# Patient Record
Sex: Male | Born: 1950 | Race: White | Hispanic: No | Marital: Married | Smoking: Never smoker
Health system: Southern US, Community
[De-identification: ages and names within clinical notes are randomized; demographics above are authoritative.]

## PROBLEM LIST (undated history)

## (undated) DIAGNOSIS — E669 Obesity, unspecified: Secondary | ICD-10-CM

## (undated) DIAGNOSIS — M86172 Other acute osteomyelitis, left ankle and foot: Secondary | ICD-10-CM

## (undated) DIAGNOSIS — N183 Chronic kidney disease, stage 3 unspecified: Secondary | ICD-10-CM

## (undated) DIAGNOSIS — I482 Chronic atrial fibrillation, unspecified: Secondary | ICD-10-CM

## (undated) DIAGNOSIS — E1169 Type 2 diabetes mellitus with other specified complication: Secondary | ICD-10-CM

## (undated) DIAGNOSIS — I10 Essential (primary) hypertension: Secondary | ICD-10-CM

## (undated) DIAGNOSIS — L97309 Non-pressure chronic ulcer of unspecified ankle with unspecified severity: Secondary | ICD-10-CM

## (undated) DIAGNOSIS — Z6841 Body Mass Index (BMI) 40.0 and over, adult: Secondary | ICD-10-CM

## (undated) DIAGNOSIS — Z9289 Personal history of other medical treatment: Secondary | ICD-10-CM

## (undated) DIAGNOSIS — E11622 Type 2 diabetes mellitus with other skin ulcer: Secondary | ICD-10-CM

## (undated) HISTORY — PX: DEBRIDEMENT  FOOT: SUR387

---

## 2018-03-05 ENCOUNTER — Inpatient Hospital Stay: Payer: Medicare Other

## 2018-03-05 ENCOUNTER — Emergency Department: Payer: Medicare Other

## 2018-03-05 ENCOUNTER — Inpatient Hospital Stay
Admission: EM | Admit: 2018-03-05 | Discharge: 2018-03-14 | DRG: 853 | Disposition: A | Discharge status: Skilled Nursing Facility | Payer: Medicare Other | Attending: Internal Medicine | Admitting: Internal Medicine

## 2018-03-05 ENCOUNTER — Other Ambulatory Visit: Payer: Self-pay

## 2018-03-05 DIAGNOSIS — M86672 Other chronic osteomyelitis, left ankle and foot: Secondary | ICD-10-CM | POA: Diagnosis present

## 2018-03-05 DIAGNOSIS — Z4659 Encounter for fitting and adjustment of other gastrointestinal appliance and device: Secondary | ICD-10-CM

## 2018-03-05 DIAGNOSIS — Z9981 Dependence on supplemental oxygen: Secondary | ICD-10-CM

## 2018-03-05 DIAGNOSIS — Z833 Family history of diabetes mellitus: Secondary | ICD-10-CM

## 2018-03-05 DIAGNOSIS — E11621 Type 2 diabetes mellitus with foot ulcer: Secondary | ICD-10-CM | POA: Diagnosis not present

## 2018-03-05 DIAGNOSIS — Z882 Allergy status to sulfonamides status: Secondary | ICD-10-CM

## 2018-03-05 DIAGNOSIS — I878 Other specified disorders of veins: Secondary | ICD-10-CM | POA: Diagnosis present

## 2018-03-05 DIAGNOSIS — K219 Gastro-esophageal reflux disease without esophagitis: Secondary | ICD-10-CM | POA: Diagnosis present

## 2018-03-05 DIAGNOSIS — M160 Bilateral primary osteoarthritis of hip: Secondary | ICD-10-CM | POA: Diagnosis present

## 2018-03-05 DIAGNOSIS — N183 Chronic kidney disease, stage 3 unspecified: Secondary | ICD-10-CM

## 2018-03-05 DIAGNOSIS — R609 Edema, unspecified: Secondary | ICD-10-CM

## 2018-03-05 DIAGNOSIS — L97529 Non-pressure chronic ulcer of other part of left foot with unspecified severity: Secondary | ICD-10-CM | POA: Diagnosis present

## 2018-03-05 DIAGNOSIS — Y92230 Patient room in hospital as the place of occurrence of the external cause: Secondary | ICD-10-CM | POA: Diagnosis not present

## 2018-03-05 DIAGNOSIS — Y9259 Other trade areas as the place of occurrence of the external cause: Secondary | ICD-10-CM | POA: Diagnosis not present

## 2018-03-05 DIAGNOSIS — Z6841 Body Mass Index (BMI) 40.0 and over, adult: Secondary | ICD-10-CM | POA: Diagnosis not present

## 2018-03-05 DIAGNOSIS — N179 Acute kidney failure, unspecified: Secondary | ICD-10-CM

## 2018-03-05 DIAGNOSIS — K3189 Other diseases of stomach and duodenum: Secondary | ICD-10-CM

## 2018-03-05 DIAGNOSIS — E785 Hyperlipidemia, unspecified: Secondary | ICD-10-CM | POA: Diagnosis present

## 2018-03-05 DIAGNOSIS — Z7901 Long term (current) use of anticoagulants: Secondary | ICD-10-CM

## 2018-03-05 DIAGNOSIS — E11622 Type 2 diabetes mellitus with other skin ulcer: Secondary | ICD-10-CM | POA: Diagnosis present

## 2018-03-05 DIAGNOSIS — Z79899 Other long term (current) drug therapy: Secondary | ICD-10-CM

## 2018-03-05 DIAGNOSIS — E1169 Type 2 diabetes mellitus with other specified complication: Secondary | ICD-10-CM | POA: Diagnosis present

## 2018-03-05 DIAGNOSIS — M86172 Other acute osteomyelitis, left ankle and foot: Secondary | ICD-10-CM | POA: Diagnosis present

## 2018-03-05 DIAGNOSIS — J9622 Acute and chronic respiratory failure with hypercapnia: Secondary | ICD-10-CM | POA: Diagnosis present

## 2018-03-05 DIAGNOSIS — R112 Nausea with vomiting, unspecified: Secondary | ICD-10-CM

## 2018-03-05 DIAGNOSIS — Z8551 Personal history of malignant neoplasm of bladder: Secondary | ICD-10-CM

## 2018-03-05 DIAGNOSIS — I472 Ventricular tachycardia: Secondary | ICD-10-CM | POA: Diagnosis present

## 2018-03-05 DIAGNOSIS — R0602 Shortness of breath: Secondary | ICD-10-CM

## 2018-03-05 DIAGNOSIS — L97429 Non-pressure chronic ulcer of left heel and midfoot with unspecified severity: Secondary | ICD-10-CM | POA: Diagnosis present

## 2018-03-05 DIAGNOSIS — R590 Localized enlarged lymph nodes: Secondary | ICD-10-CM | POA: Diagnosis present

## 2018-03-05 DIAGNOSIS — M869 Osteomyelitis, unspecified: Secondary | ICD-10-CM | POA: Diagnosis not present

## 2018-03-05 DIAGNOSIS — E1122 Type 2 diabetes mellitus with diabetic chronic kidney disease: Secondary | ICD-10-CM | POA: Diagnosis present

## 2018-03-05 DIAGNOSIS — A4151 Sepsis due to Escherichia coli [E. coli]: Secondary | ICD-10-CM | POA: Diagnosis present

## 2018-03-05 DIAGNOSIS — I130 Hypertensive heart and chronic kidney disease with heart failure and stage 1 through stage 4 chronic kidney disease, or unspecified chronic kidney disease: Secondary | ICD-10-CM | POA: Diagnosis present

## 2018-03-05 DIAGNOSIS — T502X5A Adverse effect of carbonic-anhydrase inhibitors, benzothiadiazides and other diuretics, initial encounter: Secondary | ICD-10-CM | POA: Diagnosis not present

## 2018-03-05 DIAGNOSIS — E873 Alkalosis: Secondary | ICD-10-CM | POA: Diagnosis not present

## 2018-03-05 DIAGNOSIS — G9349 Other encephalopathy: Secondary | ICD-10-CM | POA: Diagnosis not present

## 2018-03-05 DIAGNOSIS — J9621 Acute and chronic respiratory failure with hypoxia: Secondary | ICD-10-CM | POA: Diagnosis present

## 2018-03-05 DIAGNOSIS — I5032 Chronic diastolic (congestive) heart failure: Secondary | ICD-10-CM | POA: Diagnosis present

## 2018-03-05 DIAGNOSIS — I482 Chronic atrial fibrillation, unspecified: Secondary | ICD-10-CM | POA: Diagnosis present

## 2018-03-05 DIAGNOSIS — N390 Urinary tract infection, site not specified: Secondary | ICD-10-CM | POA: Diagnosis present

## 2018-03-05 DIAGNOSIS — Z823 Family history of stroke: Secondary | ICD-10-CM

## 2018-03-05 DIAGNOSIS — I10 Essential (primary) hypertension: Secondary | ICD-10-CM | POA: Diagnosis not present

## 2018-03-05 DIAGNOSIS — K7460 Unspecified cirrhosis of liver: Secondary | ICD-10-CM | POA: Diagnosis present

## 2018-03-05 DIAGNOSIS — K567 Ileus, unspecified: Secondary | ICD-10-CM | POA: Diagnosis not present

## 2018-03-05 DIAGNOSIS — G4733 Obstructive sleep apnea (adult) (pediatric): Secondary | ICD-10-CM | POA: Diagnosis present

## 2018-03-05 DIAGNOSIS — E1143 Type 2 diabetes mellitus with diabetic autonomic (poly)neuropathy: Secondary | ICD-10-CM | POA: Diagnosis present

## 2018-03-05 DIAGNOSIS — K3184 Gastroparesis: Secondary | ICD-10-CM | POA: Diagnosis present

## 2018-03-05 DIAGNOSIS — E662 Morbid (severe) obesity with alveolar hypoventilation: Secondary | ICD-10-CM | POA: Diagnosis present

## 2018-03-05 DIAGNOSIS — L97309 Non-pressure chronic ulcer of unspecified ankle with unspecified severity: Secondary | ICD-10-CM

## 2018-03-05 DIAGNOSIS — A419 Sepsis, unspecified organism: Secondary | ICD-10-CM | POA: Diagnosis not present

## 2018-03-05 DIAGNOSIS — Z794 Long term (current) use of insulin: Secondary | ICD-10-CM

## 2018-03-05 DIAGNOSIS — I429 Cardiomyopathy, unspecified: Secondary | ICD-10-CM | POA: Diagnosis not present

## 2018-03-05 DIAGNOSIS — E11610 Type 2 diabetes mellitus with diabetic neuropathic arthropathy: Secondary | ICD-10-CM | POA: Diagnosis present

## 2018-03-05 DIAGNOSIS — L03116 Cellulitis of left lower limb: Secondary | ICD-10-CM | POA: Diagnosis present

## 2018-03-05 DIAGNOSIS — I4891 Unspecified atrial fibrillation: Secondary | ICD-10-CM | POA: Diagnosis not present

## 2018-03-05 DIAGNOSIS — W06XXXA Fall from bed, initial encounter: Secondary | ICD-10-CM | POA: Diagnosis present

## 2018-03-05 DIAGNOSIS — E1151 Type 2 diabetes mellitus with diabetic peripheral angiopathy without gangrene: Secondary | ICD-10-CM | POA: Diagnosis present

## 2018-03-05 DIAGNOSIS — K30 Functional dyspepsia: Secondary | ICD-10-CM | POA: Diagnosis present

## 2018-03-05 HISTORY — DX: Type 2 diabetes mellitus with other skin ulcer: L97.309

## 2018-03-05 HISTORY — DX: Chronic kidney disease, stage 3 unspecified: N18.30

## 2018-03-05 HISTORY — DX: Type 2 diabetes mellitus with other specified complication: E11.69

## 2018-03-05 HISTORY — DX: Body Mass Index (BMI) 40.0 and over, adult: Z684

## 2018-03-05 HISTORY — DX: Essential (primary) hypertension: I10

## 2018-03-05 HISTORY — DX: Morbid (severe) obesity due to excess calories: E66.01

## 2018-03-05 HISTORY — DX: Personal history of other medical treatment: Z92.89

## 2018-03-05 HISTORY — DX: Type 2 diabetes mellitus with other skin ulcer: E11.622

## 2018-03-05 HISTORY — DX: Chronic kidney disease, stage 3 (moderate): N18.3

## 2018-03-05 HISTORY — DX: Other acute osteomyelitis, left ankle and foot: M86.172

## 2018-03-05 HISTORY — DX: Chronic atrial fibrillation, unspecified: I48.20

## 2018-03-05 HISTORY — DX: Type 2 diabetes mellitus with other specified complication: E66.9

## 2018-03-05 LAB — COMPREHENSIVE METABOLIC PANEL
ALK PHOS: 81 U/L (ref 38–126)
ALT: 18 U/L (ref 0–44)
ANION GAP: 12 (ref 5–15)
AST: 33 U/L (ref 15–41)
Albumin: 3.4 g/dL — ABNORMAL LOW (ref 3.5–5.0)
BILIRUBIN TOTAL: 0.7 mg/dL (ref 0.3–1.2)
BUN: 29 mg/dL — ABNORMAL HIGH (ref 8–23)
CALCIUM: 8.4 mg/dL — AB (ref 8.9–10.3)
CO2: 28 mmol/L (ref 22–32)
CREATININE: 1.86 mg/dL — AB (ref 0.61–1.24)
Chloride: 101 mmol/L (ref 98–111)
GFR calc non Af Amer: 36 mL/min — ABNORMAL LOW (ref >60)
GFR, EST AFRICAN AMERICAN: 42 mL/min — AB (ref >60)
GLUCOSE: 173 mg/dL — AB (ref 70–99)
Potassium: 3.9 mmol/L (ref 3.5–5.1)
Sodium: 141 mmol/L (ref 135–145)
Total Protein: 8.3 g/dL — ABNORMAL HIGH (ref 6.5–8.1)

## 2018-03-05 LAB — BLOOD CULTURE ID PANEL (REFLEXED)
Acinetobacter baumannii: NOT DETECTED
CANDIDA KRUSEI: NOT DETECTED
CARBAPENEM RESISTANCE: NOT DETECTED
Candida albicans: NOT DETECTED
Candida glabrata: NOT DETECTED
Candida parapsilosis: NOT DETECTED
Candida tropicalis: NOT DETECTED
ENTEROCOCCUS SPECIES: NOT DETECTED
Enterobacter cloacae complex: NOT DETECTED
Enterobacteriaceae species: DETECTED — AB
Escherichia coli: DETECTED — AB
Haemophilus influenzae: NOT DETECTED
KLEBSIELLA OXYTOCA: NOT DETECTED
Klebsiella pneumoniae: NOT DETECTED
LISTERIA MONOCYTOGENES: NOT DETECTED
Neisseria meningitidis: NOT DETECTED
Proteus species: NOT DETECTED
Pseudomonas aeruginosa: NOT DETECTED
SERRATIA MARCESCENS: NOT DETECTED
STAPHYLOCOCCUS AUREUS BCID: NOT DETECTED
STAPHYLOCOCCUS SPECIES: NOT DETECTED
STREPTOCOCCUS PNEUMONIAE: NOT DETECTED
Streptococcus agalactiae: NOT DETECTED
Streptococcus pyogenes: NOT DETECTED
Streptococcus species: NOT DETECTED

## 2018-03-05 LAB — URINALYSIS, ROUTINE W REFLEX MICROSCOPIC
Bacteria, UA: NONE SEEN
Bilirubin Urine: NEGATIVE
GLUCOSE, UA: NEGATIVE mg/dL
Ketones, ur: NEGATIVE mg/dL
LEUKOCYTES UA: NEGATIVE
NITRITE: NEGATIVE
Protein, ur: 100 mg/dL — AB
SQUAMOUS EPITHELIAL / LPF: NONE SEEN (ref 0–5)
Specific Gravity, Urine: 1.014 (ref 1.005–1.030)
pH: 5 (ref 5.0–8.0)

## 2018-03-05 LAB — BLOOD GAS, ARTERIAL
ACID-BASE EXCESS: 5.8 mmol/L — AB (ref 0.0–2.0)
BICARBONATE: 31.7 mmol/L — AB (ref 20.0–28.0)
FIO2: 0.44
O2 Saturation: 97.9 %
PCO2 ART: 50 mmHg — AB (ref 32.0–48.0)
PH ART: 7.41 (ref 7.350–7.450)
PO2 ART: 102 mmHg (ref 83.0–108.0)
Patient temperature: 37

## 2018-03-05 LAB — BRAIN NATRIURETIC PEPTIDE: B NATRIURETIC PEPTIDE 5: 183 pg/mL — AB (ref 0.0–100.0)

## 2018-03-05 LAB — TROPONIN I: Troponin I: 0.03 ng/mL (ref <0.03)

## 2018-03-05 LAB — CBC WITH DIFFERENTIAL/PLATELET
BASOS PCT: 0 %
Basophils Absolute: 0.1 10*3/uL (ref 0–0.1)
EOS ABS: 0 10*3/uL (ref 0–0.7)
Eosinophils Relative: 0 %
HCT: 38.9 % — ABNORMAL LOW (ref 40.0–52.0)
Hemoglobin: 12.6 g/dL — ABNORMAL LOW (ref 13.0–18.0)
Lymphocytes Relative: 2 %
Lymphs Abs: 0.4 10*3/uL — ABNORMAL LOW (ref 1.0–3.6)
MCH: 28.2 pg (ref 26.0–34.0)
MCHC: 32.5 g/dL (ref 32.0–36.0)
MCV: 87 fL (ref 80.0–100.0)
MONO ABS: 0.9 10*3/uL (ref 0.2–1.0)
MONOS PCT: 4 %
Neutro Abs: 19.9 10*3/uL — ABNORMAL HIGH (ref 1.4–6.5)
Neutrophils Relative %: 94 %
Platelets: 272 10*3/uL (ref 150–440)
RBC: 4.47 MIL/uL (ref 4.40–5.90)
RDW: 19.2 % — AB (ref 11.5–14.5)
WBC: 21.3 10*3/uL — ABNORMAL HIGH (ref 3.8–10.6)

## 2018-03-05 LAB — HEPARIN LEVEL (UNFRACTIONATED): Heparin Unfractionated: 1.61 IU/mL — ABNORMAL HIGH (ref 0.30–0.70)

## 2018-03-05 LAB — LIPASE, BLOOD: LIPASE: 28 U/L (ref 11–51)

## 2018-03-05 LAB — GLUCOSE, CAPILLARY
GLUCOSE-CAPILLARY: 125 mg/dL — AB (ref 70–99)
GLUCOSE-CAPILLARY: 166 mg/dL — AB (ref 70–99)

## 2018-03-05 LAB — PROTIME-INR
INR: 1.42
Prothrombin Time: 17.2 seconds — ABNORMAL HIGH (ref 11.4–15.2)

## 2018-03-05 LAB — APTT
aPTT: 35 seconds (ref 24–36)
aPTT: 50 seconds — ABNORMAL HIGH (ref 24–36)

## 2018-03-05 LAB — LACTIC ACID, PLASMA
LACTIC ACID, VENOUS: 1.9 mmol/L (ref 0.5–1.9)
Lactic Acid, Venous: 3 mmol/L (ref 0.5–1.9)

## 2018-03-05 MED ORDER — PIPERACILLIN-TAZOBACTAM 3.375 G IVPB: 3.3750 g | Freq: Three times a day (TID) | INTRAVENOUS | Status: DC

## 2018-03-05 MED ORDER — ADULT MULTIVITAMIN W/MINERALS CH
1.0000 | ORAL_TABLET | Freq: Every day | ORAL | Status: DC
  Administered 2018-03-05 – 2018-03-14 (×7): 1 via ORAL
  Filled 2018-03-05 (×7): qty 1

## 2018-03-05 MED ORDER — ATORVASTATIN CALCIUM 10 MG PO TABS
10.0000 mg | ORAL_TABLET | Freq: Every evening | ORAL | Status: DC
  Administered 2018-03-05: 10 mg via ORAL
  Filled 2018-03-05: qty 1

## 2018-03-05 MED ORDER — SODIUM CHLORIDE 0.9 % IV SOLN
1.0000 g | Freq: Three times a day (TID) | INTRAVENOUS | Status: DC
  Administered 2018-03-05 – 2018-03-07 (×5): 1 g via INTRAVENOUS
  Filled 2018-03-05 (×7): qty 1

## 2018-03-05 MED ORDER — PIPERACILLIN-TAZOBACTAM 3.375 G IVPB 30 MIN
3.3750 g | Freq: Once | INTRAVENOUS | Status: AC
  Administered 2018-03-05: 3.375 g via INTRAVENOUS

## 2018-03-05 MED ORDER — BUMETANIDE 1 MG PO TABS
2.0000 mg | ORAL_TABLET | Freq: Two times a day (BID) | ORAL | Status: DC
  Administered 2018-03-05 – 2018-03-06 (×2): 2 mg via ORAL
  Filled 2018-03-05 (×3): qty 2

## 2018-03-05 MED ORDER — ACETAMINOPHEN 650 MG RE SUPP: 650.0000 mg | Freq: Four times a day (QID) | RECTAL | Status: DC | PRN

## 2018-03-05 MED ORDER — BISACODYL 5 MG PO TBEC: 5.0000 mg | DELAYED_RELEASE_TABLET | Freq: Every day | ORAL | Status: DC | PRN

## 2018-03-05 MED ORDER — VANCOMYCIN HCL 10 G IV SOLR
1750.0000 mg | INTRAVENOUS | Status: DC
  Administered 2018-03-05: 1750 mg via INTRAVENOUS
  Filled 2018-03-05 (×2): qty 1750

## 2018-03-05 MED ORDER — ACETAMINOPHEN 325 MG RE SUPP
RECTAL | Status: AC
  Administered 2018-03-05: 07:00:00
  Filled 2018-03-05: qty 1

## 2018-03-05 MED ORDER — ONDANSETRON HCL 4 MG PO TABS: 4.0000 mg | ORAL_TABLET | Freq: Four times a day (QID) | ORAL | Status: DC | PRN

## 2018-03-05 MED ORDER — ALBUTEROL SULFATE (2.5 MG/3ML) 0.083% IN NEBU: 3.0000 mL | INHALATION_SOLUTION | Freq: Two times a day (BID) | RESPIRATORY_TRACT | Status: DC

## 2018-03-05 MED ORDER — ACETAMINOPHEN 650 MG RE SUPP
RECTAL | Status: AC
  Administered 2018-03-05: 07:00:00
  Filled 2018-03-05: qty 1

## 2018-03-05 MED ORDER — BRIMONIDINE TARTRATE 0.2 % OP SOLN
1.0000 [drp] | Freq: Two times a day (BID) | OPHTHALMIC | Status: DC
  Administered 2018-03-05 – 2018-03-14 (×17): 1 [drp] via OPHTHALMIC
  Filled 2018-03-05 (×3): qty 5

## 2018-03-05 MED ORDER — VANCOMYCIN HCL IN DEXTROSE 1-5 GM/200ML-% IV SOLN
1000.0000 mg | Freq: Once | INTRAVENOUS | Status: AC
  Administered 2018-03-05: 1000 mg via INTRAVENOUS
  Filled 2018-03-05: qty 200

## 2018-03-05 MED ORDER — HEPARIN (PORCINE) IN NACL 100-0.45 UNIT/ML-% IJ SOLN
2350.0000 [IU]/h | INTRAMUSCULAR | Status: DC
  Administered 2018-03-05: 1500 [IU]/h via INTRAVENOUS
  Administered 2018-03-06: 1600 [IU]/h via INTRAVENOUS
  Administered 2018-03-06: 2100 [IU]/h via INTRAVENOUS
  Administered 2018-03-07 – 2018-03-08 (×2): 2250 [IU]/h via INTRAVENOUS
  Filled 2018-03-05 (×5): qty 250

## 2018-03-05 MED ORDER — ACETAMINOPHEN 650 MG RE SUPP
975.0000 mg | Freq: Once | RECTAL | Status: AC
  Administered 2018-03-05: 975 mg via RECTAL

## 2018-03-05 MED ORDER — INSULIN ASPART 100 UNIT/ML ~~LOC~~ SOLN
0.0000 [IU] | Freq: Three times a day (TID) | SUBCUTANEOUS | Status: DC
  Administered 2018-03-05: 1 [IU] via SUBCUTANEOUS
  Administered 2018-03-06 – 2018-03-09 (×4): 2 [IU] via SUBCUTANEOUS
  Administered 2018-03-10 (×3): 3 [IU] via SUBCUTANEOUS
  Administered 2018-03-11: 5 [IU] via SUBCUTANEOUS
  Administered 2018-03-11: 3 [IU] via SUBCUTANEOUS
  Administered 2018-03-11: 5 [IU] via SUBCUTANEOUS
  Filled 2018-03-05 (×10): qty 1

## 2018-03-05 MED ORDER — HEPARIN BOLUS VIA INFUSION
4000.0000 [IU] | Freq: Once | INTRAVENOUS | Status: AC
  Administered 2018-03-05: 4000 [IU] via INTRAVENOUS
  Filled 2018-03-05: qty 4000

## 2018-03-05 MED ORDER — CARVEDILOL 12.5 MG PO TABS
12.5000 mg | ORAL_TABLET | Freq: Two times a day (BID) | ORAL | Status: DC
  Administered 2018-03-05 – 2018-03-06 (×2): 12.5 mg via ORAL
  Filled 2018-03-05 (×2): qty 2

## 2018-03-05 MED ORDER — HYDROCODONE-ACETAMINOPHEN 5-325 MG PO TABS
1.0000 | ORAL_TABLET | ORAL | Status: DC | PRN
  Administered 2018-03-05 (×2): 2 via ORAL
  Filled 2018-03-05 (×2): qty 2

## 2018-03-05 MED ORDER — SODIUM CHLORIDE 0.9 % IV BOLUS
1000.0000 mL | Freq: Once | INTRAVENOUS | Status: AC
  Administered 2018-03-05: 1000 mL via INTRAVENOUS

## 2018-03-05 MED ORDER — TRAZODONE HCL 50 MG PO TABS: 25.0000 mg | ORAL_TABLET | Freq: Every evening | ORAL | Status: DC | PRN

## 2018-03-05 MED ORDER — DOCUSATE SODIUM 100 MG PO CAPS
100.0000 mg | ORAL_CAPSULE | Freq: Two times a day (BID) | ORAL | Status: DC
  Administered 2018-03-06 – 2018-03-08 (×3): 100 mg via ORAL
  Filled 2018-03-05 (×3): qty 1

## 2018-03-05 MED ORDER — ACETAMINOPHEN 325 MG PO TABS
650.0000 mg | ORAL_TABLET | Freq: Four times a day (QID) | ORAL | Status: DC | PRN
  Administered 2018-03-09 – 2018-03-13 (×4): 650 mg via ORAL
  Filled 2018-03-05 (×5): qty 2

## 2018-03-05 MED ORDER — ONDANSETRON HCL 4 MG/2ML IJ SOLN
4.0000 mg | Freq: Four times a day (QID) | INTRAMUSCULAR | Status: DC | PRN
  Administered 2018-03-06 (×2): 4 mg via INTRAVENOUS
  Filled 2018-03-05 (×2): qty 2

## 2018-03-05 MED ORDER — FERROUS SULFATE 325 (65 FE) MG PO TABS
325.0000 mg | ORAL_TABLET | Freq: Two times a day (BID) | ORAL | Status: DC
  Administered 2018-03-05 – 2018-03-14 (×14): 325 mg via ORAL
  Filled 2018-03-05 (×14): qty 1

## 2018-03-05 MED ORDER — ALBUTEROL SULFATE (2.5 MG/3ML) 0.083% IN NEBU
3.0000 mL | INHALATION_SOLUTION | Freq: Two times a day (BID) | RESPIRATORY_TRACT | Status: DC
  Administered 2018-03-05 – 2018-03-08 (×7): 3 mL via RESPIRATORY_TRACT
  Filled 2018-03-05 (×7): qty 3

## 2018-03-05 MED ORDER — INSULIN ASPART 100 UNIT/ML ~~LOC~~ SOLN
3.0000 [IU] | Freq: Three times a day (TID) | SUBCUTANEOUS | Status: DC
  Administered 2018-03-05: 3 [IU] via SUBCUTANEOUS
  Filled 2018-03-05: qty 1

## 2018-03-05 MED ORDER — APIXABAN 5 MG PO TABS: 5.0000 mg | ORAL_TABLET | Freq: Two times a day (BID) | ORAL | Status: DC

## 2018-03-05 NOTE — ED Notes (Signed)
Called report to receiving nurse on Tele floor. Called MRI to arrange transport of pt to floor. Dorian takes pts belongings and spouse to MRI at this time.

## 2018-03-05 NOTE — Progress Notes (Signed)
ANTICOAGULATION CONSULT NOTE - Initial Consult  Pharmacy Consult for heparin Indication: atrial fibrillation  Allergies  Allergen Reactions  . Bactrim [Sulfamethoxazole-Trimethoprim] Other (See Comments)    Caused kidney failure    Patient Measurements: Height: 6\' 1"  (185.4 cm) Weight: (!) 374 lb 1.6 oz (169.7 kg) IBW/kg (Calculated) : 79.9 Heparin Dosing Weight: 103 kg  Vital Signs: Temp: 98.9 F (37.2 C) (07/14 1325) Temp Source: Oral (07/14 1325) BP: 119/62 (07/14 1325) Pulse Rate: 91 (07/14 1325)  Labs: Recent Labs    03/05/18 0646 03/05/18 1336  HGB 12.6*  --   HCT 38.9*  --   PLT 272  --   APTT  --  35  LABPROT 17.2*  --   INR 1.42  --   HEPARINUNFRC  --  1.61*  CREATININE 1.86*  --   TROPONINI 0.03*  --     Estimated Creatinine Clearance: 63.1 mL/min (A) (by C-G formula based on SCr of 1.86 mg/dL (H)).   Medical History: History reviewed. No pertinent past medical history.  Medications:  Infusions:  . heparin    . piperacillin-tazobactam (ZOSYN)  IV    . vancomycin 1,750 mg (03/05/18 1448)    Assessment: 67 yom cc weakness with PMH DM, AF on Eliquis PTA, CHF, HTN. Per RN patient and wife are both unable to provide detailed information regarding last Eliquis dose. Pharmacy consulted to dose heparin for AF. Baseline aPTT, PT/INR, and heparin level are ordered - Eliquis has been discontinued.   Update: Per floor RN, pt last took dose last night, no dose yet today of apixaban.   Goal of Therapy:  Heparin level 0.3-0.7 units/ml aPTT 68 to 109 seconds Monitor platelets by anticoagulation protocol: Yes   Plan:  Give 4000 units bolus x 1 Start heparin infusion at 1500 units/hr Check apTT level in 6 hours, heparin levels daily until aPTT and HL correlate, then daily while on heparin Continue to monitor H&H and platelets  Regino SchultzeWang, Daisy FloroHannah L, Pharm.D., BCPS Clinical Pharmacist 03/05/2018,3:01 PM

## 2018-03-05 NOTE — ED Notes (Signed)
Paged admitting physician with concerns of pt going to medsurg floor.

## 2018-03-05 NOTE — Progress Notes (Signed)
Pharmacy Antibiotic Note  Alan Dillon is a 67 y.o. 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  Pharmacy has been consulted for vancomycin and Zosyn dosing. Per H&P continue vancomycin and Zosyn.  Pt received Zosyn x1 and vancomycin 1 g x1 in the ED.   Plan: Zosyn 3.375g IV q8h (4 hour infusion).   Will continue dosing with vancomycin 1750 mg IV q24h Ke 0.037, half life 18.7 h, Vd 78.4 Dillon - normalized CrCl ~39.2 ml/min Goal trough 15-20 mcg/ ml Trough before 4th dose Will need to continue to closely follow renal function    Height: 6\' 1"  (185.4 cm) Weight: (!) 374 lb 1.6 oz (169.7 kg) IBW/kg (Calculated) : 79.9  Temp (24hrs), Avg:101.7 F (38.7 C), Min:98.9 F (37.2 C), Max:104.1 F (40.1 C)  Recent Labs  Lab 03/05/18 0646 03/05/18 0957  WBC 21.3*  --   CREATININE 1.86*  --   LATICACIDVEN 3.0* 1.9    Estimated Creatinine Clearance: 63.1 mL/min (A) (by C-G formula based on SCr of 1.86 mg/dL (H)).     Allergies  Allergen Reactions  . Bactrim [Sulfamethoxazole-Trimethoprim] Other (See Comments)    Caused kidney failure    Antimicrobials this admission: Vancomycin/Zosyn 7/14>>  Dose adjustments this admission:   Microbiology results: 7/14 BCx: sent 7/14 UCx: sent    Thank you for allowing pharmacy to be a part of this patient's care.  Alan Dillon, Alan Dillon 03/05/2018 1:52 PM

## 2018-03-05 NOTE — ED Notes (Signed)
Patient able to answer with name and correct month. Patient does not give an answer when asked where he is, what year it is, or if he has pain/where pain is. Patient told EMS he wears oxygen. No oxygen tank or supplies seen at hotel where patient was staying.   Patient has dark discoloration to bilateral lower extremities. Patient has covered wound on left foot.

## 2018-03-05 NOTE — Progress Notes (Signed)
ANTICOAGULATION CONSULT NOTE - Initial Consult  Pharmacy Consult for heparin Indication: atrial fibrillation  Allergies  Allergen Reactions  . Bactrim [Sulfamethoxazole-Trimethoprim] Other (See Comments)    Caused kidney failure    Patient Measurements: Height: 5\' 10"  (177.8 cm) Weight: (!) 374 lb 1.6 oz (169.7 kg) IBW/kg (Calculated) : 73 Heparin Dosing Weight: 103 kg  Vital Signs: Temp: 101.3 F (38.5 C) (07/14 0911) Temp Source: Axillary (07/14 0911) BP: 123/59 (07/14 1115) Pulse Rate: 106 (07/14 1115)  Labs: Recent Labs    03/05/18 0646  HGB 12.6*  HCT 38.9*  PLT 272  LABPROT 17.2*  INR 1.42  CREATININE 1.86*  TROPONINI 0.03*    Estimated Creatinine Clearance: 60.9 mL/min (A) (by C-G formula based on SCr of 1.86 mg/dL (H)).   Medical History: No past medical history on file.  Medications:  Infusions:  . heparin      Assessment: 67 yom cc weakness with PMH DM, AF on Eliquis PTA, CHF, HTN. Per RN patient and wife are both unable to provide detailed information regarding last Eliquis dose. Pharmacy consulted to dose heparin for AF. Baseline aPTT, PT/INR, and heparin level are ordered - Eliquis has been discontinued.   Goal of Therapy:  Heparin level 0.3-0.7 units/ml aPTT 68 to 109 seconds Monitor platelets by anticoagulation protocol: Yes   Plan:  Give 6000 units bolus x 1 Start heparin infusion at 1500 units/hr Check apTT level in 6 hours, heparin levels daily until aPTT and HL correlate, then daily while on heparin Continue to monitor H&H and platelets  Carola FrostNathan A Arkel Cartwright, Pharm.D., BCPS Clinical Pharmacist 03/05/2018,11:41 AM

## 2018-03-05 NOTE — ED Notes (Signed)
Lactic reported from lab of 3.0

## 2018-03-05 NOTE — ED Notes (Signed)
Called respiratory to request ABG draw

## 2018-03-05 NOTE — Consult Note (Signed)
Reason for Consult: Osteomyelitis left calcaneus. Referring Physician: Nasim Habeeb is an 67 y.o. male.  HPI: This is a 67 year old male with diabetes and associated neuropathy with a chronic ulceration and osteomyelitis on his left heel that he has dealt with for the last 3 to 4 years.  States he did have previous debridement back in 2016.  His wife has been performing his dressing changes and he has been followed by wound care.  Brought into the hospital this morning due to some lethargy and fever and was admitted for sepsis and osteomyelitis of the left heel.  Past Medical History:  Diagnosis Date  . Atrial fibrillation, chronic (HCC)    On apixaban  . CKD (chronic kidney disease), stage III ([place])   . Diabetes mellitus type 2 in obese ([place])   . 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 History of hemodialysis    Bactrim mediated  Acute renal failure  . Hypertension   . Morbid obesity with BMI of 45.0-49.9, adult ([place])   . Osteomyelitis of ankle or foot, acute, left ([place])    chronic    Past Surgical History:  Procedure Laterality Date  . DEBRIDEMENT  FOOT Left     Family History  Problem Relation Age of Onset  . Stroke Mother   . Diabetes Father     Social History:  reports that he has never smoked. He has never used smokeless tobacco. His alcohol and drug histories are not on file.  Allergies:  Allergies  Allergen Reactions  . Bactrim [Sulfamethoxazole-Trimethoprim] Other (See Comments)    Caused kidney failure    Medications:  Scheduled: . albuterol  3 mL Inhalation BID  . atorvastatin  10 mg Oral QPM  . brimonidine  1 drop Both Eyes BID  . bumetanide  2 mg Oral BID  . carvedilol  12.5 mg Oral BID  . docusate sodium  100 mg Oral BID  . ferrous sulfate  325 mg Oral BID  . insulin aspart  0-15 Units Subcutaneous TID WC  . insulin aspart  3 Units Subcutaneous TID WC  . multivitamin with minerals  1  tablet Oral Daily    Results for orders placed or performed during the hospital encounter of 03/05/18 (from the past 48 hour(s))  Comprehensive metabolic panel     Status: Abnormal   Collection Time: 03/05/18  6:46 AM  Result Value Ref Range   Sodium 141 135 - 145 mmol/L   Potassium 3.9 3.5 - 5.1 mmol/L   Chloride 101 98 - 111 mmol/L    Comment: Please note change in reference range.   CO2 28 22 - 32 mmol/L   Glucose, Bld 173 (H) 70 - 99 mg/dL    Comment: Please note change in reference range.   BUN 29 (H) 8 - 23 mg/dL    Comment: Please note change in reference range.   Creatinine, Ser 1.86 (H) 0.61 - 1.24 mg/dL   Calcium 8.4 (L) 8.9 - 10.3 mg/dL   Total Protein 8.3 (H) 6.5 - 8.1 g/dL   Albumin 3.4 (L) 3.5 - 5.0 g/dL   AST 33 15 - 41 U/L   ALT 18 0 - 44 U/L    Comment: Please note change in reference range.   Alkaline Phosphatase 81 38 - 126 U/L   Total Bilirubin 0.7 0.3 - 1.2 mg/dL   GFR calc non Af Amer 36 (L) >60 mL/min  GFR calc Af Amer 42 (L) >60 mL/min    Comment: (NOTE) The eGFR has been calculated using the CKD EPI equation. This calculation has not been validated in all clinical situations. eGFR's persistently <60 mL/min signify possible Chronic Kidney Disease.    Anion gap 12 5 - 15    Comment: Performed at [facility], [address] [place], [place] [address]  CBC WITH DIFFERENTIAL     Status: Abnormal   Collection Time: 03/05/18  6:46 AM  Result Value Ref Range   WBC 21.3 (H) 3.8 - 10.6 K/uL   RBC 4.47 4.40 - 5.90 MIL/uL   Hemoglobin 12.6 (L) 13.0 - 18.0 g/dL   HCT 38.9 (L) 40.0 - 52.0 %   MCV 87.0 80.0 - 100.0 fL   MCH 28.2 26.0 - 34.0 pg   MCHC 32.5 32.0 - 36.0 g/dL   RDW 19.2 (H) 11.5 - 14.5 %   Platelets 272 150 - 440 K/uL   Neutrophils Relative % 94 %   Neutro Abs 19.9 (H) 1.4 - 6.5 K/uL   Lymphocytes Relative 2 %   Lymphs Abs 0.4 (L) 1.0 - 3.6 K/uL   Monocytes Relative 4 %   Monocytes Absolute 0.9 0.2 - 1.0 K/uL   Eosinophils  Relative 0 %   Eosinophils Absolute 0.0 0 - 0.7 K/uL   Basophils Relative 0 %   Basophils Absolute 0.1 0 - 0.1 K/uL    Comment: Performed at [facility], [address] [place], [place] [address]  Lactic acid, plasma     Status: Abnormal   Collection Time: 03/05/18  6:46 AM  Result Value Ref Range   Lactic Acid, Venous 3.0 (HH) 0.5 - 1.9 mmol/L    Comment: CRITICAL RESULT CALLED TO, READ BACK BY AND VERIFIED WITH [place] DAVIS AT [phone] ON 03/05/18 BY SNJ Performed at [facility], [address] [place], [place] [address]   Lipase, blood     Status: None   Collection Time: 03/05/18  6:46 AM  Result Value Ref Range   Lipase 28 11 - 51 U/L    Comment: Performed at [facility], [address] [place], [place] [address]  Troponin I     Status: Abnormal   Collection Time: 03/05/18  6:46 AM  Result Value Ref Range   Troponin I 0.03 (HH) <0.03 ng/mL    Comment: CRITICAL RESULT CALLED TO, READ BACK BY AND VERIFIED WITH JESSICA REEVES  AT 0747 ON 03/05/18 BY SNJ. Performed at [facility], [address] [place], [place] [address]   Protime-INR     Status: Abnormal   Collection Time: 03/05/18  6:46 AM  Result Value Ref Range   Prothrombin Time 17.2 (H) 11.4 - 15.2 seconds   INR 1.42     Comment: Performed at [facility], [address] [place], [place] [address]  Blood Culture (routine x 2)     Status: None (Preliminary result)   Collection Time: 03/05/18  6:47 AM  Result Value Ref Range   Specimen Description BLOOD RIGHT FOREARM    Special Requests      BOTTLES DRAWN AEROBIC AND ANAEROBIC Blood Culture adequate volume   Culture  Setup Time      GRAM NEGATIVE RODS IN BOTH AEROBIC AND ANAEROBIC BOTTLES CRITICAL RESULT CALLED TO, READ BACK BY AND VERIFIED WITH: SHEEMA HALLAJI @ 8182 ON 03/05/2018 BY CAF Performed at [facility], [address] [place], [place] [address]  Culture GRAM NEGATIVE RODS    Report Status  PENDING   Brain natriuretic peptide     Status: Abnormal   Collection Time: 03/05/18  6:47 AM  Result Value Ref Range   B Natriuretic Peptide 183.0 (H) 0.0 - 100.0 pg/mL    Comment: Performed at [facility], [address] [place], [place] [address]  Blood Culture (routine x 2)     Status: None (Preliminary result)   Collection Time: 03/05/18  6:49 AM  Result Value Ref Range   Specimen Description BLOOD RIGHT HAND    Special Requests      BOTTLES DRAWN AEROBIC AND ANAEROBIC Blood Culture results may not be optimal due to an excessive volume of blood received in culture bottles   Culture  Setup Time      Organism ID to follow IN BOTH AEROBIC AND ANAEROBIC BOTTLES GRAM NEGATIVE RODS CRITICAL RESULT CALLED TO, READ BACK BY AND VERIFIED WITH: SHEEMA HALLAJI @ 1611 ON 03/05/2018 BY CAF Performed at [facility], [address] [place], [place] [address]    Culture GRAM NEGATIVE RODS    Report Status PENDING   Blood Culture ID Panel (Reflexed)     Status: Abnormal   Collection Time: 03/05/18  6:49 AM  Result Value Ref Range   Enterococcus species NOT DETECTED NOT DETECTED   Listeria monocytogenes NOT DETECTED NOT DETECTED   Staphylococcus species NOT DETECTED NOT DETECTED   Staphylococcus aureus NOT DETECTED NOT DETECTED   Streptococcus species NOT DETECTED NOT DETECTED   Streptococcus agalactiae NOT DETECTED NOT DETECTED   Streptococcus pneumoniae NOT DETECTED NOT DETECTED   Streptococcus pyogenes NOT DETECTED NOT DETECTED   Acinetobacter baumannii NOT DETECTED NOT DETECTED   Enterobacteriaceae species DETECTED (A) NOT DETECTED    Comment: Enterobacteriaceae represent a large family of gram-negative bacteria, not a single organism. CRITICAL RESULT CALLED TO, READ BACK BY AND VERIFIED WITH: SHEEMA HALLAJI @ 5625 ON 03/05/2018 BY CAF    Enterobacter cloacae complex NOT DETECTED NOT DETECTED   Escherichia coli DETECTED (A) NOT DETECTED    Comment: CRITICAL RESULT  CALLED TO, READ BACK BY AND VERIFIED WITH: SHEEMA HALLAJI @ 6389 ON 03/05/2018 BY CAF    Klebsiella oxytoca NOT DETECTED NOT DETECTED   Klebsiella pneumoniae NOT DETECTED NOT DETECTED   Proteus species NOT DETECTED NOT DETECTED   Serratia marcescens NOT DETECTED NOT DETECTED   Carbapenem resistance NOT DETECTED NOT DETECTED   Haemophilus influenzae NOT DETECTED NOT DETECTED   Neisseria meningitidis NOT DETECTED NOT DETECTED   Pseudomonas aeruginosa NOT DETECTED NOT DETECTED   Candida albicans NOT DETECTED NOT DETECTED   Candida glabrata NOT DETECTED NOT DETECTED   Candida krusei NOT DETECTED NOT DETECTED   Candida parapsilosis NOT DETECTED NOT DETECTED   Candida tropicalis NOT DETECTED NOT DETECTED    Comment: Performed at [facility], [address] [place], [place] [address]  Urinalysis, Routine w reflex microscopic     Status: Abnormal   Collection Time: 03/05/18  7:19 AM  Result Value Ref Range   Color, Urine YELLOW (A) YELLOW   APPearance HAZY (A) CLEAR   Specific Gravity, Urine 1.014 1.005 - 1.030   pH 5.0 5.0 - 8.0   Glucose, UA NEGATIVE NEGATIVE mg/dL   Hgb urine dipstick MODERATE (A) NEGATIVE   Bilirubin Urine NEGATIVE NEGATIVE   Ketones, ur NEGATIVE NEGATIVE mg/dL   Protein, ur 100 (A) NEGATIVE mg/dL   Nitrite NEGATIVE NEGATIVE   Leukocytes, UA NEGATIVE NEGATIVE  RBC / HPF 6-10 0 - 5 RBC/hpf   WBC, UA 0-5 0 - 5 WBC/hpf   Bacteria, UA NONE SEEN NONE SEEN   Squamous Epithelial / LPF NONE SEEN 0 - 5   Mucus PRESENT    Hyaline Casts, UA PRESENT    Amorphous [place] PRESENT     Comment: Performed at [facility], [address] [place], [place] [address]  Blood gas, arterial ([facility], [facility], [facility])     Status: Abnormal   Collection Time: 03/05/18  7:20 AM  Result Value Ref Range   FIO2 0.44    Delivery systems NASAL CANNULA    pH, Arterial 7.41 7.350 - 7.450   pCO2 arterial 50 (H) 32.0 - 48.0 mmHg   pO2, Arterial 102 83.0 - 108.0 mmHg   Bicarbonate  31.7 (H) 20.0 - 28.0 mmol/L   Acid-Base Excess 5.8 (H) 0.0 - 2.0 mmol/L   O2 Saturation 97.9 %   Patient temperature 37.0    Collection site RIGHT RADIAL    Sample type ARTERIAL DRAW    Allens test (pass/fail) PASS PASS    Comment: Performed at [facility], [address] [place], [place] [address]  Lactic acid, plasma     Status: None   Collection Time: 03/05/18  9:57 AM  Result Value Ref Range   Lactic Acid, Venous 1.9 0.5 - 1.9 mmol/L    Comment: Performed at [facility], [address] [place], [place] [address]  APTT     Status: None   Collection Time: 03/05/18  1:36 PM  Result Value Ref Range   aPTT 35 24 - 36 seconds    Comment: Performed at [facility], [place], [place] [address]  Heparin level (unfractionated)     Status: Abnormal   Collection Time: 03/05/18  1:36 PM  Result Value Ref Range   Heparin Unfractionated 1.61 (H) 0.30 - 0.70 IU/mL    Comment: (NOTE) If heparin results are below expected values, and patient dosage has  been confirmed, suggest follow up testing of antithrombin III levels. Performed at [facility], [address] [place], [place] [address]   Glucose, capillary     Status: Abnormal   Collection Time: 03/05/18  4:38 PM  Result Value Ref Range   Glucose-Capillary 125 (H) 70 - 99 mg/dL    Ct Abdomen Pelvis Wo Contrast  Result Date: 03/05/2018 CLINICAL DATA:  67 year old male with fever of unknown origin, leukocytosis. Evaluate for intra-abdominal source for infection. EXAM: CT ABDOMEN AND PELVIS WITHOUT CONTRAST TECHNIQUE: Multidetector CT imaging of the abdomen and pelvis was performed following the standard protocol without IV contrast. COMPARISON:  None. FINDINGS: Lower chest: Respiratory motion artifact limits evaluation for small pulmonary nodules. Mild bibasilar subsegmental atelectasis. Incompletely imaged cardiomegaly. Calcifications of the aortic valve and coronary arteries are  noted. No pericardial effusion. Unremarkable distal thoracic esophagus. Hepatobiliary: Relative hypertrophy of the left hepatic lobe and caudate lobe with blunting of the free edge of the liver suggests morphologic changes of cirrhosis. No discrete hepatic mass, gallbladder abnormality or biliary ductal dilatation. Pancreas: No pancreatic mass or inflammatory changes. There are a few punctate calcifications which may represent the sequelae of prior pancreatitis. Spleen: Normal in size without focal abnormality. Adrenals/Urinary Tract: Normal adrenal glands. No evidence of hydronephrosis or nephrolithiasis. Unremarkable ureters. Foley catheter present in the collapsed bladder. Stomach/Bowel: Normal colon, appendix and terminal ileum. The stomach and duodenum are also unremarkable. However, there are several loops of dilated  and air-filled small bowel left of midline in the abdomen without a definite transition point which are non-specific. No significant inflammatory changes in the adjacent mesentery and no evidence of interloop fluid. Vascular/Lymphatic: Limited evaluation in the absence of intravenous contrast. No evidence of aneurysm. Calcifications present throughout the abdominal aorta. Abnormal lymphadenopathy is present in the left retroperitoneum beginning in the iliac chain. Multiple hypertrophic lymph nodes are present in the left common and external iliac nodal stations as well as in the left superficial inguinal region. Index lymph nodes as follows: Left superficial inguinal node 2.0 cm in short axis (image 109 series 2); left deep inguinal lymph node 1.5 cm in short axis (image 96 series 2); left external iliac node measures 2.7 cm in short axis (image 87 series 2); left external iliac node measures 2.2 cm in short axis (image 72 series 2); left common iliac node measures 1.5 cm in short axis (image 61 series 2). There are also mildly prominent, but not definitively enlarged nodes along the right iliac  chain. No additional lymphadenopathy is identified. Reproductive: Prostate is unremarkable. Other: No abdominal wall hernia or abnormality. No abdominopelvic ascites. Musculoskeletal: No acute fracture or aggressive appearing lytic or blastic osseous lesion. Multiple subacute to remote bilateral rib fractures, incompletely imaged. Multilevel degenerative disc disease and bilateral facet arthropathy. Mild bilateral hip joint osteoarthritis. Chronic right L5 pars fracture. IMPRESSION: 1. The primary abnormality is lymphadenopathy along the left iliac and inguinal nodal stations. Differential considerations include reactive adenopathy if the patient has a significant or chronic infectious/inflammatory process involving the left lower extremity. Additionally, a lymphoproliferative process such as lymphoma, or less likely metastatic adenopathy are considerations. Lymphoma is favored in the absence of a known chronic left lower extremity infectious process. Recommend further evaluation with PET-CT to assess for hypermetabolic activity and to identify a suitable target for tissue sampling. 2. Several loops of gaseous distension of small bowel in the left mid abdomen without significant wall thickening or transition point. Differential considerations include mild gastroenteritis and potentially early or partial small bowel obstruction. 3. Morphologic changes of early hepatic cirrhosis. 4. Aortic valve calcifications. 5. Coronary artery atherosclerotic calcifications. 6.  Aortic Atherosclerosis (ICD10-170.0). 7. Foley catheter in place in the collapsed bladder. 8. Multiple bilateral subacute to remote rib fractures. 9. Multilevel degenerative disc disease and bilateral facet arthropathy. 10. Chronic right L5 pars fracture. 11. Mild bilateral hip joint osteoarthritis. Electronically Signed   By: Jacqulynn Cadet M.D.   On: 03/05/2018 09:45   Ct Head Wo Contrast  Result Date: 03/05/2018 CLINICAL DATA:  Altered mental  status EXAM: CT HEAD WITHOUT CONTRAST TECHNIQUE: Contiguous axial images were obtained from the base of the skull through the vertex without intravenous contrast. COMPARISON:  None. FINDINGS: Brain: 5 mm hyper density in the anterior superior third ventricle compatible with colloid cyst. No hydrocephalus. Negative for infarct or hemorrhage. No fluid collection or midline shift. Vascular: Negative for hyperdense vessel Skull: Negative Sinuses/Orbits: Paranasal sinuses clear. Bilateral cataract surgery. 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 No acute abnormality. Electronically Signed   By: Franchot Gallo M.D.   On: 03/05/2018 09:26   Mr Foot Left Wo Contrast  Result Date: 03/05/2018 CLINICAL DATA:  Plantar ulceration of the left foot with severe lower extremity edema. Evaluate for osteomyelitis. EXAM: MRI OF THE LEFT FOOT WITHOUT CONTRAST TECHNIQUE: Multiplanar, multisequence MR imaging of the left foot was performed. No intravenous contrast was administered. Osteomyelitis protocol MRI of the foot  was obtained, to include the entire foot and ankle. This protocol uses a large field of view to cover the entire foot and ankle, and is suitable for assessing bony structures for osteomyelitis. Due to the large field of view and imaging plane choice, this protocol is less sensitive for assessing small structures such as ligamentous structures of the foot and ankle, compared to a dedicated forefoot or dedicated hindfoot exam. COMPARISON:  Left foot x-rays from same day. FINDINGS: Bones/Joint/Cartilage Cortical irregularity and focal marrow edema with decreased T1 marrow signal in the plantar posterior calcaneus, consistent with osteomyelitis. No fracture or dislocation. Severe pes planus. Hindfoot valgus with degenerative marrow edema and cystic change in the lateral talus and articulating calcaneus. Small amount of marrow edema and irregularity of the distal fibula. Mild  osteoarthritis of the first MTP joint. Moderate osteoarthritis of the first IP joint. No joint effusion. Ligaments Grossly intact. Muscles and Tendons Flexor, peroneal and extensor compartment tendons are intact. Severe fatty atrophy of the foot muscles. Soft tissue Large plantar hindfoot soft tissue ulceration. No fluid collection or hematoma. No soft tissue mass. Moderate skin thickening and soft tissue swelling of the foot. IMPRESSION: 1. Large plantar hindfoot soft tissue ulcer with osteomyelitis of the plantar calcaneus. No abscess. 2. Severe pes planovalgus with evidence of lateral hindfoot impingement. Electronically Signed   By: Titus Dubin M.D.   On: 03/05/2018 13:13   Dg Chest Port 1 View  Result Date: 03/05/2018 CLINICAL DATA:  Fever and hypoxia. EXAM: PORTABLE CHEST 1 VIEW COMPARISON:  None FINDINGS: There is moderate cardiac enlargement and aortic atherosclerosis. Decreased lung volumes. No pleural effusion or edema. No airspace opacities identified. IMPRESSION: 1. No acute findings. 2. Cardiac enlargement. 3.  Aortic Atherosclerosis (ICD10-I70.0). Electronically Signed   By: Kerby Moors M.D.   On: 03/05/2018 07:30   Dg Foot 2 Views Left  Result Date: 03/05/2018 CLINICAL DATA:  Ulcer of left foot. EXAM: LEFT FOOT - 2 VIEW COMPARISON:  None. FINDINGS: There is marked diffuse soft tissue swelling. The bones appear diffusely osteopenic. Large soft tissue ulcer along the plantar surface of the hindfoot and midfoot noted. There is increased sclerosis and periosteal reaction along the plantar surface of the calcaneus which may represent the sequelae of chronic osteomyelitis. IMPRESSION: 1. Large plantar soft tissue ulceration. 2. Increase sclerosis and periosteal reaction along the plantar surface of the calcaneus which may represent chronic osteomyelitis. Acute on chronic osteomyelitis not excluded. This could be better assessed with MRI with contrast material or three-phase bone scintigraphy.  Electronically Signed   By: Kerby Moors M.D.   On: 03/05/2018 07:58    Review of Systems  Constitutional: Positive for fever. Negative for chills and malaise/fatigue.       Earlier this morning.  HENT: Negative.   Eyes: Negative.   Respiratory: Negative.   Cardiovascular: Negative.   Gastrointestinal: Negative for nausea and vomiting.  Genitourinary: Negative.   Musculoskeletal: Negative.   Skin:       Patient relates a chronic ulceration on the left heel.  Significant drainage.  Neurological:       Patient relates neuropathy related to his diabetes.  Endo/Heme/Allergies: Negative.   Psychiatric/Behavioral: Negative.    Blood pressure (!) 120/58, pulse 84, temperature 98.5 F (36.9 C), temperature source Oral, resp. rate 20, height 6' 1"  (1.854 m), weight (!) 166.5 kg (367 lb), SpO2 100 %. Physical Exam  Cardiovascular:  DP and PT pulses are diminished bilateral but can be barely palpated.  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  Stiff range of motion in the pedal joints.  Muscle testing deferred.  Neurological:  Loss of protective threshold with a monofilament wire in the foot and toes to the level of the distal leg.  Proprioception impaired.  Skin:  The skin is warm dry and atrophic bilateral with chronic edema and stasis dermatitis changes.  Chronic full-thickness ulceration on the plantar aspect of the left heel and arch area measuring approximately 11 cm x 4 cm at the widest.  Depth approximately 5 to 7 mm.  Significant drainage noted from the wound.    Assessment/Plan: Assessment: 1.  Osteomyelitis left calcaneus. 2.  Chronic full thickness ulceration left heel. 3.  Diabetes with associated neuropathy.  Plan: A bulky dry bandage was applied to the left foot and ankle.  Discussed with the patient and his wife the MRI findings of osteomyelitis in the heel.  We discussed the need for debridement of this but at this point we will allow him to be off  of his Eliquis for 2 to 3 days.  At this point most likely plan for debridement Tuesday afternoon.  Discussed with the patient that he will need to be off of his foot with no weight for an extended period of time to allow for healing as well as his risk for pathologic fracture following debridement.  Discussed that he is at significant risk for lower extremity amputation on the left side.  Also discussed with the patient and his wife that he will need some extended skilled nursing care.  Also we will await vascular surgery's input.  We will follow the patient closely over the next few days.  Durward Fortes 03/05/2018, 5:38 PM

## 2018-03-05 NOTE — ED Notes (Signed)
Paduchowski aware of critical lactate 3 and troponin .03

## 2018-03-05 NOTE — Progress Notes (Signed)
Spoke with podiatry again recommended to be off Eliquis for at least 2 to 3 days before he does debridement.  So started on diet.

## 2018-03-05 NOTE — Progress Notes (Signed)
ANTICOAGULATION CONSULT NOTE - Initial Consult  Pharmacy Consult for heparin Indication: atrial fibrillation  Allergies  Allergen Reactions  . Bactrim [Sulfamethoxazole-Trimethoprim] Other (See Comments)    Caused kidney failure    Patient Measurements: Height: 6\' 1"  (185.4 cm) Weight: (!) 367 lb (166.5 kg) IBW/kg (Calculated) : 79.9 Heparin Dosing Weight: 103 kg  Vital Signs: Temp: 98.6 F (37 C) (07/14 1916) Temp Source: Oral (07/14 1916) BP: 109/29 (07/14 1916) Pulse Rate: 81 (07/14 2024)  Labs: Recent Labs    03/05/18 0646 03/05/18 1336 03/05/18 2235  HGB 12.6*  --   --   HCT 38.9*  --   --   PLT 272  --   --   APTT  --  35 50*  LABPROT 17.2*  --   --   INR 1.42  --   --   HEPARINUNFRC  --  1.61*  --   CREATININE 1.86*  --   --   TROPONINI 0.03*  --   --     Estimated Creatinine Clearance: 62.4 mL/min (A) (by C-G formula based on SCr of 1.86 mg/dL (H)).   Medical History: Past Medical History:  Diagnosis Date  . Atrial fibrillation, chronic (HCC)    On apixaban  . CKD (chronic kidney disease), stage III (HCC)   . Diabetes mellitus type 2 in obese (HCC)   . 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 History of hemodialysis    Bactrim mediated  Acute renal failure  . Hypertension   . Morbid obesity with BMI of 45.0-49.9, adult (HCC)   . Osteomyelitis of ankle or foot, acute, left (HCC)    chronic    Medications:  Infusions:  . heparin 1,600 Units/hr (03/05/18 2314)  . meropenem (MERREM) IV Stopped (03/05/18 2026)    Assessment: 67 yom cc weakness with PMH DM, AF on Eliquis PTA, CHF, HTN. Per RN patient and wife are both unable to provide detailed information regarding last Eliquis dose. Pharmacy consulted to dose heparin for AF. Baseline aPTT, PT/INR, and heparin level are ordered - Eliquis has been discontinued.   Update: Per floor RN, pt last took dose last night, no dose yet today of  apixaban.   Goal of Therapy:  Heparin level 0.3-0.7 units/ml aPTT 68 to 109 seconds Monitor platelets by anticoagulation protocol: Yes   Plan:  Give 4000 units bolus x 1 Start heparin infusion at 1500 units/hr Check apTT level in 6 hours, heparin levels daily until aPTT and HL correlate, then daily while on heparin Continue to monitor H&H and platelets  7/14:  APTT @ 22:00 = 50 Will increase Heparin gtt to 1600 units/hr and recheck aPTT on 7/15 with AM labs.   Imaya Duffy D, Pharm.D Clinical Pharmacist 03/05/2018,11:30 PM

## 2018-03-05 NOTE — ED Provider Notes (Signed)
[facility] Emergency Department Provider Note  Time seen: 7:05 AM  I have reviewed the triage vital signs and the nursing notes.   HISTORY  Chief Complaint Weakness    HPI Alan Dillon is a 67 y.o. male with a past medical history per report of diabetes, atrial fibrillation, CHF, hypertension, unknown further history at this time presents to the emergency department after a fall.  According to EMS report patient was at the [facility], wife stated the patient fell onto the floor, called EMS.  EMS noted the patient to be confused and have a fever, but they state per wife that this was the patient's baseline.  Upon arrival to the emergency department the patient is awake, will occasionally attempt to answer questions but is largely inaccurate, cannot tell me where he is, cannot follow simple commands.  Unable to contribute to his history or review of systems.   No past medical history on file.  There are no active problems to display for this patient.   Prior to Admission medications   Not on File    Allergies not on file  No family history on file.  Social History Social History   Tobacco Use  . Smoking status: Not on file  Substance Use Topics  . Alcohol use: Not on file  . Drug use: Not on file    Review of Systems Unable to complete a adequate review of systems due to altered mental status  ____________________________________________   PHYSICAL EXAM:  VITAL SIGNS: ED Triage Vitals  Enc Vitals Group     BP 03/05/18 0641 139/84     Pulse Rate 03/05/18 0641 (!) 120     Resp 03/05/18 0641 (!) 29     Temp 03/05/18 0641 (!) 102.5 F (39.2 C)     Temp Source 03/05/18 0658 Rectal     SpO2 03/05/18 0641 (!) 84 %     Weight 03/05/18 0642 (!) 374 lb 1.6 oz (169.7 kg)     Height --      Head Circumference --      Peak Flow --      Pain Score --      Pain Loc --      Pain Edu? --      Excl. in GC? --     Constitutional: Alert,  but confused, unable to follow simple commands or answer simple questions. Eyes: Normal exam ENT   Head: Normocephalic and atraumatic   Mouth/Throat: Mucous membranes are moist. Cardiovascular: Regular rhythm, rate around 120 bpm. Respiratory: Mild to moderate tachypnea but very shallow breaths, diminished bilaterally but no obvious wheeze rales or rhonchi. Gastrointestinal: Obese, soft, no reaction to abdominal palpation Musculoskeletal: Significant lower extremity edema bilaterally with darkening of skin consistent chronic venous stasis.  Patient has a very large ulceration to the bottom of the left foot that was actively dressed with what appears to be consistent with a wound care dressing.  Patient does have mild erythema of the left lower leg as well however with a chronic venous changes it is difficult to ascertain how much erythema. Neurologic: Patient occasionally tries to answer questions, but is inaccurate.  Unable to complete an adequate neurological exam due to altered mental status. Skin:  Skin is warm, large ulceration to the bottom of the left foot as described above Psychiatric: Patient is confused, unable to answer simple questions or follow simple commands but is awake.  ____________________________________________    EKG  EKG reviewed and  interpreted by myself shows atrial fibrillation with rapid ventricular response of 116 bpm with a narrow QRS, normal axis, largely normal intervals besides slight QTC prolongation but nonspecific ST changes but no ST elevation.  ____________________________________________    RADIOLOGY  CT scan of the head shows no acute abnormality Chest x-ray shows no acute abnormality Foot x-ray shows large ulceration with chronic versus acute on chronic osteomyelitis CT scan of the abdomen shows significant left inguinal lymphadenopathy.  ____________________________________________   INITIAL IMPRESSION / ASSESSMENT AND PLAN / ED  COURSE  Pertinent labs & imaging results that were available during my care of the patient were reviewed by me and considered in my medical decision making (see chart for details).  Patient presents to the emergency department found to be febrile to 104.1, tachycardic and confused consistent with altered mental status/sepsis.  We will check labs, urinalysis, chest x-ray.  We will start broad-spectrum antibiotics and continue to closely monitor in the emergency department.  Patient will require admission to the hospital once results are known.  Currently the patient is confused, he is hypoxic in the 80s on room air, placed on nasal cannula oxygen.  Patient does have a large ulceration to the bottom of his left foot which could be a source of infection we will obtain x-ray imaging to rule out gas-producing organisms as well as osteomyelitis.  CT scan of the head is negative, x-ray is largely negative, foot x-ray shows chronic versus acute on chronic osteomyelitis.  CT scan of the abdomen however does show significant left inguinal lymphadenopathy, this would be consistent with a significant left lower extremity infection likely due to ulceration with erythema the left lower extremity suspected degree of cellulitis as well.  We will continue with IV antibiotics.  Patient meets sepsis protocols.  Patient will require admission to the hospitalist service for continued treatment.  CRITICAL CARE Performed by: Minna AntisKevin Vayla Wilhelmi   Total critical care time: 45 minutes  Critical care time was exclusive of separately billable procedures and treating other patients.  Critical care was necessary to treat or prevent imminent or life-threatening deterioration.  Critical care was time spent personally by me on the following activities: development of treatment plan with patient and/or surrogate as well as nursing, discussions with consultants, evaluation of patient's response to treatment, examination of patient,  obtaining history from patient or surrogate, ordering and performing treatments and interventions, ordering and review of laboratory studies, ordering and review of radiographic studies, pulse oximetry and re-evaluation of patient's condition.  ____________________________________________   FINAL CLINICAL IMPRESSION(S) / ED DIAGNOSES  Sepsis Fever Altered mental status Osteomyelitis Cellulitis    Minna AntisPaduchowski, Zimere Dunlevy, MD 03/05/18 [phone]

## 2018-03-05 NOTE — Progress Notes (Signed)
Per podiatry MD RN will discontinue wound care consult til after debridement. I will continue to assess.

## 2018-03-05 NOTE — Progress Notes (Signed)
Pharmacy Antibiotic Note  Alan Dillon is a 67 y.o. male admitted on 03/05/2018 with sepsis. Pharmacy originally  consulted for vancomycin and Zosyn dosing. BCID showing E. Coli Pharmacy now consulted for meropenem dosing.    Plan:  Start meropenem 1g IV every 8 hours.   Height: 6\' 1"  (185.4 cm) Weight: (!) 367 lb (166.5 kg) IBW/kg (Calculated) : 79.9  Temp (24hrs), Avg:101.1 F (38.4 C), Min:98.5 F (36.9 C), Max:104.1 F (40.1 C)  Recent Labs  Lab 03/05/18 0646 03/05/18 0957  WBC 21.3*  --   CREATININE 1.86*  --   LATICACIDVEN 3.0* 1.9    Estimated Creatinine Clearance: 62.4 mL/min (A) (by C-G formula based on SCr of 1.86 mg/dL (H)).     Allergies  Allergen Reactions  . Bactrim [Sulfamethoxazole-Trimethoprim] Other (See Comments)    Caused kidney failure    Antimicrobials this admission: Vancomycin/Zosyn 7/14>> 7/14 Meropenem 7/14>>   Dose adjustments this admission:   Microbiology results: 7/14 BCx: E. Coli  7/14 UCx: sent    Thank you for allowing pharmacy to be a part of this patient's care.  Gardner CandleSheema M Ellissa Ayo, PharmD, BCPS Clinical Pharmacist 03/05/2018 4:26 PM

## 2018-03-05 NOTE — ED Notes (Signed)
Patient placed on 6L [place]

## 2018-03-05 NOTE — H&P (Addendum)
[facility] [place] at [facility]   PATIENT NAME: Alan Dillon    MR#:  [id number]  DATE OF BIRTH:  09/03/1950  DATE OF ADMISSION:  03/05/2018  PRIMARY CARE PHYSICIAN: Tsosie Billing   REQUESTING/REFERRING PHYSICIAN: Minna Antis  Chief complaint; generalized weakness   Chief Complaint  Patient presents with  . Weakness    HISTORY OF PRESENT ILLNESS:  Alan Dillon  is a 67 y.o. male with a known history of hypertension, diabetes mellitus type 2, chronic atrial fibrillation on apixaban, chronic kidney disease stage III, chronic osteomyelitis of the left heel getting wound care comes from portal this morning.  According to EMS patient found to be confused and has fever104 F. But patient wife told me that he just slipped out of the bed.  Patient alert, awake, oriented during my visit.  But when the patient came he was confused and lethargic.  Initial work-up showed CT head unremarkable, patient x-ray of the left foot showed cellulitis, large plantar soft tissue ulcer, calcaneal osteomyelitis.  Patient white count is up at 21.3, lactic acid also is high around 3.  Patient is [place],his PCP,Nephrologist and cardiologist.  The patient states that patient has been having this calcaneal and plantar osteomyelitis and cellulitis issues for almost 4 years, getting wound care nurse visits at home, wife is changing the dressings every day for left heel.  She also mentioned that he was on Bactrim for a long time ,an year ago and he was on dialysis because of Bactrim and his renal failure and patient was on dialysis for almost on and off for 4 months.  Patient has been followed by kidney doctor, this time creatinine is around 1.86 I do not have any records in care everywhere requested the [facility] from [facility].  Patient has chronic atrial fibrillation for almost 4 to 5 years, patient is on apixaban for that.. Patient also has morbid obesity, on oxygen  2 L all the time.  about to get sleep study this Wednesday for CPAP evaluation.  Patient right now on 4 L of oxygen and saturation 98%.  Chest x-ray is negative, PAST MEDICAL HISTORY:  No past medical history on file.  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  PAST SURGICAL HISTOIRY:  History of left heel debridement No smoking, drinking.   SOCIAL HISTORY:   Social History   Tobacco Use  . Smoking status: Not on file  Substance Use Topics  . Alcohol use: Not on file    FAMILY HISTORY:  No family history on file. Family history ;patient father has diabetes Mother has history of stroke DRUG ALLERGIES:   Allergies  Allergen Reactions  . Bactrim [Sulfamethoxazole-Trimethoprim] Other (See Comments)    Caused kidney failure    REVIEW OF SYSTEMS:  CONSTITUTIONAL: No fever, fatigue or weakness.  EYES: No blurred or double vision.  EARS, NOSE, AND THROAT: No tinnitus or ear pain.  RESPIRATORY: No cough, shortness of breath, wheezing or hemoptysis.  CARDIOVASCULAR: No chest pain, orthopnea, edema.  GASTROINTESTINAL: No nausea, vomiting, diarrhea or abdominal pain.  GENITOURINARY: No dysuria, hematuria.  ENDOCRINE: No polyuria, nocturia,  HEMATOLOGY: No anemia, easy bruising or bleeding SKIN: No rash or lesion. MUSCULOSKELETAL: No joint pain or arthritis.  NEUROLOGIC: No tingling, numbness, weakness.  PSYCHIATRY: No anxiety or depression.   MEDICATIONS AT HOME:   Prior to Admission medications   Medication Sig Start Date End Date Taking? Authorizing Provider  acetaminophen (TYLENOL) 500 MG tablet Take 500 mg by mouth every 6 (six) hours as needed for mild pain or fever.   Yes [provider]  albuterol  (PROVENTIL HFA;VENTOLIN HFA) 108 (90 Base) MCG/ACT inhaler Inhale 2 puffs into the lungs 2 (two) times daily.   Yes [provider]  ammonium lactate (LAC-HYDRIN) 12 % lotion Apply 1 application topically as needed for dry skin (on legs).   Yes [provider]  apixaban (ELIQUIS) 5 MG TABS tablet Take 5 mg by mouth 2 (two) times daily.   Yes [provider]  atorvastatin (LIPITOR) 10 MG tablet Take 10 mg by mouth every evening.   Yes [provider]  brimonidine (ALPHAGAN) 0.2 % ophthalmic solution Place 1 drop into both eyes 2 (two) times daily.   Yes [provider]  bumetanide (BUMEX) 2 MG tablet Take 2 mg by mouth 2 (two) times daily.   Yes [provider]  carvedilol (COREG) 12.5 MG tablet Take 12.5 mg by mouth 2 (two) times daily.   Yes [provider]  cetirizine (ZYRTEC) 10 MG tablet Take 10 mg by mouth daily.   Yes [provider]  Cholecalciferol (D3-1000) 1000 units capsule Take 1,000 Units by mouth daily.   Yes [provider]  clotrimazole-betamethasone (LOTRISONE) cream Apply 1 application topically 2 (two) times daily as needed (to skin folds).   Yes [provider]  famotidine (PEPCID) 20 MG tablet Take 20 mg by mouth every evening.    Yes [provider]  ferrous sulfate 325 (65 FE) MG tablet Take 325 mg by mouth 2 (two) times daily.   Yes [provider]  gabapentin (NEURONTIN) 300 MG capsule Take 300 mg by mouth 2 (two) times daily.   Yes [provider]  hydrALAZINE (APRESOLINE) 25 MG tablet Take 25 mg by mouth 3 (three) times daily.   Yes [provider]  insulin aspart (NOVOLOG FLEXPEN) 100 UNIT/ML FlexPen Inject 36 Units into the skin 3 (three) times daily before meals. According to sliding scale if sugars above 150.   Yes [provider]  Insulin Degludec (TRESIBA FLEXTOUCH) 200 UNIT/ML SOPN Inject 56 Units into the skin 2 (two) times daily.    Yes [provider]  metFORMIN (GLUCOPHAGE-XR) 500 MG 24 hr tablet Take 500 mg by mouth 2 (two) times daily.   Yes [provider]  Multiple Vitamins-Minerals (MULTIVITAMIN WITH MINERALS) tablet Take 1 tablet by mouth daily.   Yes [provider]  Probiotic Product (PROBIOTIC PO) Take 1 tablet by mouth daily.   Yes [provider]      VITAL SIGNS:  Blood pressure (!) 123/59, pulse (!) 106, temperature (!) 101.3 F (38.5 C), temperature source Axillary, resp. rate 20, height 5\' 10"  (1.778 m), weight (!) 169.7 kg (374 lb 1.6 oz), SpO2 98 %.  PHYSICAL EXAMINATION:  GENERAL:  67 y.o.-year-old patient lying in the bed with no acute distress.  Morbid obesity eYES: Pupils equal, round, reactive to light and accommodation. No scleral icterus. Extraocular muscles intact.  HEENT: Head atraumatic, normocephalic. Oropharynx and nasopharynx clear.  NECK:  Supple, no jugular venous distention. No thyroid enlargement, no tenderness.  LUNGS: Decreased breath sound bilaterally CARDIOVASCULAR: S1, S2  Irregular.  No murmurs, rubs, or gallops.   ABDOMEN:  Soft, nontender, nondistended. Bowel sounds present. No organomegaly or mass.  EXTREMITIES: 2+ pitting edema bilaterally, patient has a large ulcer on the bottom of the left foot actively dressed, patient has chronic venous stasis in both legs. NEUROLOGIC: Cranial nerves II through XII are intact. Muscle strength 5/5 in all extremities. Sensation intact. Gait not checked.  Patient has no gross neurological neurological deficit. PSYCHIATRIC: The patient is alert and oriented x 3.  SKIN: Large ulcer on the bottom of the left foot  LABORATORY PANEL:   CBC Recent Labs  Lab 03/05/18 0646  WBC 21.3*  HGB 12.6*  HCT 38.9*  PLT 272   ------------------------------------------------------------------------------------------------------------------  Chemistries  Recent Labs  Lab 03/05/18 0646  NA 141  K 3.9  CL 101   CO2 28  GLUCOSE 173*  BUN 29*  CREATININE 1.86*  CALCIUM 8.4*  AST 33  ALT 18  ALKPHOS 81  BILITOT 0.7   ------------------------------------------------------------------------------------------------------------------  Cardiac Enzymes Recent Labs  Lab 03/05/18 0646  TROPONINI 0.03*   ------------------------------------------------------------------------------------------------------------------  RADIOLOGY:  Ct Abdomen Pelvis Wo Contrast  Result Date: 03/05/2018 CLINICAL DATA:  67 year old male with fever of unknown origin, leukocytosis. Evaluate for intra-abdominal source for infection. EXAM: CT ABDOMEN AND PELVIS WITHOUT CONTRAST TECHNIQUE: Multidetector CT imaging of the abdomen and pelvis was performed following the standard protocol without IV contrast. COMPARISON:  None. FINDINGS: Lower chest: Respiratory motion artifact limits evaluation for small pulmonary nodules. Mild bibasilar subsegmental atelectasis. Incompletely imaged cardiomegaly. Calcifications of the aortic valve and coronary arteries are noted. No pericardial effusion. Unremarkable distal thoracic esophagus. Hepatobiliary: Relative hypertrophy of the left hepatic lobe and caudate lobe with blunting of the free edge of the liver suggests morphologic changes of cirrhosis. No discrete hepatic mass, gallbladder abnormality or biliary ductal dilatation. Pancreas: No pancreatic mass or inflammatory changes. There are a few punctate calcifications which may represent the sequelae of prior pancreatitis. Spleen: Normal in size without focal abnormality. Adrenals/Urinary Tract: Normal adrenal glands. No evidence of hydronephrosis or nephrolithiasis. Unremarkable ureters. Foley catheter present in the collapsed bladder. Stomach/Bowel: Normal colon, appendix and terminal ileum. The stomach and duodenum are also unremarkable. However, there are several loops of dilated and air-filled small bowel left of midline in the abdomen  without a definite transition point which are non-specific. No significant inflammatory changes in the adjacent mesentery and no evidence of interloop fluid. Vascular/Lymphatic: Limited evaluation in the absence of intravenous contrast. No evidence of aneurysm. Calcifications present throughout the abdominal aorta. Abnormal lymphadenopathy is present in the left retroperitoneum beginning in the iliac chain. Multiple hypertrophic lymph nodes are present in the left common and external iliac nodal stations as well as in the left superficial inguinal region. Index lymph nodes as follows: Left superficial inguinal node 2.0 cm in short axis (image 109 series 2); left deep inguinal lymph node 1.5 cm in short axis (image 96 series 2); left external iliac node measures 2.7 cm in short axis (image 87 series 2); left external iliac node measures 2.2 cm in short axis (image 72 series 2); left common iliac node measures 1.5 cm in short axis (image 61 series 2). There are also mildly prominent, but not definitively enlarged nodes along the right iliac chain. No additional lymphadenopathy is identified. Reproductive: Prostate is unremarkable. Other: No abdominal wall hernia or abnormality. No abdominopelvic ascites. Musculoskeletal: No acute fracture or aggressive appearing lytic or blastic osseous lesion. Multiple subacute to remote bilateral rib fractures, incompletely imaged. Multilevel degenerative disc disease and bilateral facet arthropathy. Mild  bilateral hip joint osteoarthritis. Chronic right L5 pars fracture. IMPRESSION: 1. The primary abnormality is lymphadenopathy along the left iliac and inguinal nodal stations. Differential considerations include reactive adenopathy if the patient has a significant or chronic infectious/inflammatory process involving the left lower extremity. Additionally, a lymphoproliferative process such as lymphoma, or less likely metastatic adenopathy are considerations. Lymphoma is favored in  the absence of a known chronic left lower extremity infectious process. Recommend further evaluation with PET-CT to assess for hypermetabolic activity and to identify a suitable target for tissue sampling. 2. Several loops of gaseous distension of small bowel in the left mid abdomen without significant wall thickening or transition point. Differential considerations include mild gastroenteritis and potentially early or partial small bowel obstruction. 3. Morphologic changes of early hepatic cirrhosis. 4. Aortic valve calcifications. 5. Coronary artery atherosclerotic calcifications. 6.  Aortic Atherosclerosis (ICD10-170.0). 7. Foley catheter in place in the collapsed bladder. 8. Multiple bilateral subacute to remote rib fractures. 9. Multilevel degenerative disc disease and bilateral facet arthropathy. 10. Chronic right L5 pars fracture. 11. Mild bilateral hip joint osteoarthritis. Electronically Signed   By: Malachy Moan M.D.   On: 03/05/2018 09:45   Ct Head Wo Contrast  Result Date: 03/05/2018 CLINICAL DATA:  Altered mental status EXAM: CT HEAD WITHOUT CONTRAST TECHNIQUE: Contiguous axial images were obtained from the base of the skull through the vertex without intravenous contrast. COMPARISON:  None. FINDINGS: Brain: 5 mm hyper density in the anterior superior third ventricle compatible with colloid cyst. No hydrocephalus. Negative for infarct or hemorrhage. No fluid collection or midline shift. Vascular: Negative for hyperdense vessel Skull: Negative Sinuses/Orbits: Paranasal sinuses clear. Bilateral cataract surgery. 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 No acute abnormality. Electronically Signed   By: Marlan Palau M.D.   On: 03/05/2018 09:26   Dg Chest Port 1 View  Result Date: 03/05/2018 CLINICAL DATA:  Fever and hypoxia. EXAM: PORTABLE CHEST 1 VIEW COMPARISON:  None FINDINGS: There is moderate cardiac enlargement and aortic atherosclerosis.  Decreased lung volumes. No pleural effusion or edema. No airspace opacities identified. IMPRESSION: 1. No acute findings. 2. Cardiac enlargement. 3.  Aortic Atherosclerosis (ICD10-I70.0). Electronically Signed   By: Signa Kell M.D.   On: 03/05/2018 07:30   Dg Foot 2 Views Left  Result Date: 03/05/2018 CLINICAL DATA:  Ulcer of left foot. EXAM: LEFT FOOT - 2 VIEW COMPARISON:  None. FINDINGS: There is marked diffuse soft tissue swelling. The bones appear diffusely osteopenic. Large soft tissue ulcer along the plantar surface of the hindfoot and midfoot noted. There is increased sclerosis and periosteal reaction along the plantar surface of the calcaneus which may represent the sequelae of chronic osteomyelitis. IMPRESSION: 1. Large plantar soft tissue ulceration. 2. Increase sclerosis and periosteal reaction along the plantar surface of the calcaneus which may represent chronic osteomyelitis. Acute on chronic osteomyelitis not excluded. This could be better assessed with MRI with contrast material or three-phase bone scintigraphy. Electronically Signed   By: Signa Kell M.D.   On: 03/05/2018 07:58    EKG:   Orders placed or performed during the hospital encounter of 03/05/18  . EKG 12-Lead  . EKG 12-Lead  . ED EKG 12-Lead  . ED EKG 12-Lead   EKG shows chronic atrial fibrillation.  110 bpm leukocytosis IMPRESSION AND PLAN:   67 year old morbidly obese male with history of hypertension, diabetes mellitus type 2, chronic atrial fibrillation, chronic kidney disease stage III, history of nonhealing left plantar ulcers  for almost 4 years on and off, with history of osteomyelitis and on Bactrim for long time, patient had renal failure secondary to Bactrim requiring hemodialysis last year comes in with sepsis, patient found to have fever, now has elevated lactic acid, acute on chronic osteomyelitis of the left calcaneum. 1.  Sepsis secondary to osteomyelitis of the left calcaneum likely secondary to  vascular insufficiency and diabetes mellitus type 2: Admitted for telemetry, patient is not in septic shock but he was lethargic when he came but mental status improved by the time I saw the patient admitted to telemetry for sepsis, continue vancomycin, Zosyn, cautious hydration, follow blood cultures. 2.  Acute on chronic osteomyelitis of the left calcaneum, plantar cellulitis: Spoke with Dr.Todd Graciela HusbandsKlein from podiatry, because patient is on Eliquis unable to do debridement today.  He will see the patient, ordered dressing changes, continue vancomycin, Zosyn.  [facility]  3.   acute on chronic respiratory failure secondary to underlying sepsis, continue oxygen 4 L. 4.  Chronic atrial fibrillation, patient is on Coreg, continue Coreg, monitor on telemetry, started on heparin drip.   Hold Eliquis because of planned debridement and further work-up consult cardiology, reviewed records and see if patient had any recent echo.  Patient is on Bumex 2 mg p.o. twice daily but wife told me that he did not take any  yesterday, patient already received a liter of fluid bolus in the emergency room, because  complains of shortness of breath and also has leg edema hesitant to start IV fluids.  #5 CKD stage III: Nephrology consulted. #6 diabetes mellitus type 2: Hold Tresiba, continue sliding scale insulin with coverage, hold metformin because of renal failure and sepsis, consulted diabetes coordinator, check hemoglobin A1c after reviewing patient's medical records and if it is not done recently.    All the records are reviewed and case discussed with ED provider. Management plans discussed with the patient, family and they are in agreement.  CODE STATUS: full  TOTAL TIME TAKING CARE OF THIS PATIENT:55 minutes.    Katha HammingSnehalatha Normalee Sistare M.D on 03/05/2018 at 11:39 AM  Between 7am to 6pm - Pager - [phone]  After 6pm go to [email or web address] - password [email or web address]  [facility]  [facility]  Office  [phone]  CC: Primary care physician; Tsosie BillingMiller, Isaac  Note: This dictation was prepared with Dragon dictation along with smaller phrase technology. Any transcriptional errors that result from this process are unintentional.

## 2018-03-05 NOTE — ED Notes (Signed)
Wife arrives bedside. Updated on plan of care.

## 2018-03-05 NOTE — ED Triage Notes (Signed)
[facility] Patient fell from bed. Per patient's wife, patient is acting at baseline. Patient is only intermittently responding to questions. Patient has hx of Afib, diabetes, CHF, and hypertension. Patient's EMS CBG was 177. patient has bilateral lower leg swelling, with dark discoloration to bilateral legs.

## 2018-03-05 NOTE — Consult Note (Signed)
Cardiology Consultation:   Patient ID: Alan Dillon; [id number]; [birth year]   Admit date: 03/05/2018 Date of Consult: 03/05/2018  Primary Care Provider: Tsosie Billing Primary Cardiologist: No primary care provider on file.   cardiologist in the [place], [place]  Primary Electrophysiologist: None Requesting physician: Dr. Luberta Mutter  Patient Profile:   Alan Dillon is a 67 y.o. male with a hx of chronic diabetic ulcer with now sepsis from chronic calcaneal osteomyelitis with altered mental status.  He has a history of chronic atrial fibrillation for the last 4-5 years on apixaban at home.  He is admitted for altered mental status and sepsis.  He has been being seen today for the management of atrial fibrillation at the request of Dr. Luberta Mutter.  History of Present Illness:   Alan Dillon (a resident of [place], [place]) was brought to [facility] via EMS with altered mental status.  Apparently he was confused and febrile to 104 per EMS evaluation.  Apparently he slipped out of the bed this morning and then EMS was called.  The patient was confused and lethargic upon arrival, but was more clear upon internal medicine evaluation.  He apparently has had chronic diabetic ulcers on his left heel with intermittent osteomyelitis and cellulitis for over 2 years.  He gets  continuous wound care from home visits as well as his wife providing most of the dressing changes.  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  He tells me he is not symptom medic at all with his atrial fibrillation.  He usually has relatively well-controlled heart rates.  The only time he gets short of breath is if he exerts himself more than usual.  He usually uses a motorized scooter to get around.  They were actually here visiting in the area doing antique shopping. He does state that he had a stress test and  echocardiogram done when he was diagnosed with A. fib.  Past Medical History:  Diagnosis Date  . Atrial fibrillation, chronic (HCC)    On apixaban  . CKD (chronic kidney disease), stage III (HCC)   . Diabetes mellitus type 2 in obese (HCC)   . 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 History of hemodialysis    Bactrim mediated  Acute renal failure  . Hypertension   . Morbid obesity with BMI of 45.0-49.9, adult (HCC)   . Osteomyelitis of ankle or foot, acute, left (HCC)    chronic     Past Surgical History:  Procedure Laterality Date  . DEBRIDEMENT  FOOT Left      Home Medications:  Prior to Admission medications   Medication Sig Start Date End Date Taking? Authorizing Provider  acetaminophen (TYLENOL) 500 MG tablet Take 500 mg by mouth every 6 (six) hours as needed for mild pain or fever.   Yes [provider]  albuterol (PROVENTIL HFA;VENTOLIN HFA) 108 (90 Base) MCG/ACT inhaler Inhale 2 puffs into the lungs 2 (two) times daily.   Yes [provider]  ammonium lactate (LAC-HYDRIN) 12 % lotion Apply 1 application topically as needed for dry skin (on legs).   Yes [provider]  apixaban (ELIQUIS) 5 MG TABS tablet Take 5 mg by mouth 2 (two) times daily.   Yes [provider]  atorvastatin (LIPITOR) 10 MG tablet Take 10 mg by mouth every evening.   Yes [provider]  brimonidine (ALPHAGAN) 0.2 % ophthalmic solution Place 1 drop into both eyes 2 (two) times daily.   Yes [provider]  bumetanide (BUMEX) 2 MG tablet Take 2 mg by mouth 2 (two) times daily.   Yes [provider]  carvedilol (COREG) 12.5 MG tablet Take 12.5 mg by mouth 2 (two) times daily.   Yes [provider]  cetirizine (ZYRTEC) 10 MG tablet Take 10 mg by mouth daily.   Yes [provider]  Cholecalciferol (D3-1000) 1000 units capsule Take 1,000 Units by mouth daily.   Yes [provider]  clotrimazole-betamethasone (LOTRISONE) cream Apply 1 application topically 2 (two) times daily as needed (to skin folds).   Yes [provider]  famotidine (PEPCID) 20 MG tablet Take 20 mg by mouth every evening.    Yes [provider]  ferrous sulfate 325 (65 FE) MG tablet Take 325 mg by mouth 2 (two) times daily.   Yes [provider]  gabapentin (NEURONTIN) 300 MG capsule Take 300 mg by mouth 2 (two) times daily.   Yes [provider]  hydrALAZINE (APRESOLINE) 25 MG tablet Take 25 mg by mouth 3 (three) times daily.   Yes [provider]  insulin aspart (NOVOLOG FLEXPEN) 100 UNIT/ML FlexPen Inject 36 Units into the skin 3 (three) times daily before meals. According to sliding scale if sugars above 150.   Yes [provider]  Insulin Degludec (TRESIBA FLEXTOUCH) 200 UNIT/ML SOPN Inject 56 Units into the skin 2 (two) times daily.   Yes [provider]  metFORMIN (GLUCOPHAGE-XR) 500 MG 24 hr tablet Take 500 mg by mouth 2 (two) times daily.   Yes [provider]  Multiple Vitamins-Minerals (MULTIVITAMIN WITH MINERALS) tablet Take 1 tablet by mouth daily.   Yes [provider]  Probiotic Product (PROBIOTIC PO) Take 1 tablet by mouth daily.   Yes [provider]    Inpatient Medications: Scheduled Meds: . acetaminophen      . acetaminophen      . albuterol  3 mL Inhalation BID  . atorvastatin  10 mg Oral QPM  . brimonidine  1 drop Both Eyes BID  . bumetanide  2 mg Oral BID  . carvedilol  12.5 mg Oral BID  . docusate sodium  100 mg Oral BID  . ferrous sulfate  325 mg Oral BID  . heparin  4,000 Units Intravenous Once  . insulin aspart  0-15 Units Subcutaneous TID WC  . insulin aspart  3 Units Subcutaneous TID WC  . multivitamin with minerals  1 tablet Oral Daily   Continuous Infusions: . heparin    . piperacillin-tazobactam (ZOSYN)  IV    . vancomycin 1,750 mg (03/05/18 1448)   PRN  Meds: acetaminophen **OR** acetaminophen, bisacodyl, HYDROcodone-acetaminophen, ondansetron **OR** ondansetron (ZOFRAN) IV, traZODone  Allergies:    Allergies  Allergen Reactions  . Bactrim [Sulfamethoxazole-Trimethoprim] Other (See Comments)    Caused kidney failure    Social History:   Social History   Tobacco Use  . Smoking status: Never Smoker  . Smokeless tobacco: Never Used  Substance Use Topics  . Alcohol use: Not on file  . Drug use: Not on file   Social History   Social History Narrative  . Not on file     Family History:   Family History  Problem Relation Age of Onset  . Stroke Mother   . Diabetes Father      ROS:  Please see the history  of present illness.  Review of Systems  Constitutional: Positive for fever and malaise/fatigue. Negative for weight loss.  HENT: Negative for congestion and nosebleeds.   Eyes: Negative for discharge.  Respiratory: Positive for cough and shortness of breath (With exertion).   Cardiovascular: Positive for leg swelling (Chronic with stasis changes.  He does often wear support stockings or the pneumatic compression devices.). Negative for palpitations (Despite having atrial fibrillation, he is asymptomatic.).  Gastrointestinal: Negative for abdominal pain, blood in stool and melena.  Genitourinary: Negative for hematuria.  Musculoskeletal: Positive for back pain and joint pain.  Neurological: Positive for dizziness, weakness and headaches. Negative for focal weakness and seizures.       Confusion.  Altered mental status  Psychiatric/Behavioral: The patient is nervous/anxious.   All other systems reviewed and are negative.    Physical Exam/Data:   Vitals:   03/05/18 0945 03/05/18 1015 03/05/18 1115 03/05/18 1325  BP: 130/70 129/60 (!) 123/59 119/62  Pulse: (!) 108 (!) 108 (!) 106 91  Resp: (!) 32 20 20   Temp:    98.9 F (37.2 C)  TempSrc:    Oral  SpO2: 95% 96% 98% 98%  Weight:    (!) 367 lb (166.5 kg)  Height:     6\' 1"  (1.854 m)    Intake/Output Summary (Last 24 hours) at 03/05/2018 1536 Last data filed at 03/05/2018 0837 Gross per 24 hour  Intake 1250 ml  Output -  Net 1250 ml   Filed Weights   03/05/18 0642 03/05/18 1325  Weight: (!) 374 lb 1.6 oz (169.7 kg) (!) 367 lb (166.5 kg)   Body mass index is 48.42 kg/m.  Physical Exam  Constitutional: He is oriented to person, place, and time. He appears well-developed. No distress.  Morbidly obese, chronically ill-appearing gentleman resting in bed.  HENT:  Head: Normocephalic and atraumatic.  Mouth/Throat: Oropharynx is clear and moist. No oropharyngeal exudate.  Eyes: Pupils are equal, round, and reactive to light. Conjunctivae and EOM are normal. No scleral icterus.  Neck: Normal range of motion. Neck supple. No hepatojugular reflux and no JVD present. Carotid bruit is not present.  Cardiovascular: Normal rate. An irregularly irregular rhythm present. PMI is not displaced (Cannot palpate due to body habitus). Exam reveals distant heart sounds and decreased pulses (Unable to palpate on left foot due to dressing.  Weak bilateral pedal pulses). Exam reveals no gallop and no S4.  No murmur heard. Pulmonary/Chest: Effort normal. No respiratory distress.  Distant breath sounds mild interstitial sounds but no rales or rhonchi.  Abdominal: Soft. Bowel sounds are normal. He exhibits no distension and no mass. There is no tenderness. There is no rebound.  Obese  Musculoskeletal: Normal range of motion. He exhibits edema (2+ bilateral).   Large plantar ulcer on left foot.  Neurological: He is alert and oriented to person, place, and time. No cranial nerve deficit.  Skin: Skin is warm and dry. He is not diaphoretic.  Bilateral venous stasis dermatitis changes.  Nursing note and vitals reviewed.   EKG:  The EKG was personally reviewed and demonstrates: Atrial fibrillation rapid rate (160 bpm).  Computer read suggests PVCs, but this is more likely related  artifact.  Cannot exclude septal infarct, age undetermined.  Wander exclude the ability to evaluate for any ischemic ST or T wave changes.  Telemetry:  Telemetry was personally reviewed and demonstrates: Atrial fibrillation with rates in the 90s  Relevant CV Studies: No available studies.  Laboratory Data: 2+ bilateral  Chemistry Recent Labs  Lab 03/05/18 0646  NA 141  K 3.9  CL 101  CO2 28  GLUCOSE 173*  BUN 29*  CREATININE 1.86*  CALCIUM 8.4*  GFRNONAA 36*  GFRAA 42*  ANIONGAP 12    Recent Labs  Lab 03/05/18 0646  PROT 8.3*  ALBUMIN 3.4*  AST 33  ALT 18  ALKPHOS 81  BILITOT 0.7   Hematology Recent Labs  Lab 03/05/18 0646  WBC 21.3*  RBC 4.47  HGB 12.6*  HCT 38.9*  MCV 87.0  MCH 28.2  MCHC 32.5  RDW 19.2*  PLT 272   Cardiac Enzymes Recent Labs  Lab 03/05/18 0646  TROPONINI 0.03*   No results for input(s): TROPIPOC in the last 168 hours.  BNP Recent Labs  Lab 03/05/18 0647  BNP 183.0*    DDimer No results for input(s): DDIMER in the last 168 hours.  Radiology/Studies:  Ct Abdomen Pelvis Wo Contrast  Result Date: 03/05/2018 CLINICAL DATA:  67 year old male with fever of unknown origin, leukocytosis. Evaluate for intra-abdominal source for infection. EXAM: CT ABDOMEN AND PELVIS WITHOUT CONTRAST TECHNIQUE: Multidetector CT imaging of the abdomen and pelvis was performed following the standard protocol without IV contrast. COMPARISON:  None. FINDINGS: Lower chest: Respiratory motion artifact limits evaluation for small pulmonary nodules. Mild bibasilar subsegmental atelectasis. Incompletely imaged cardiomegaly. Calcifications of the aortic valve and coronary arteries are noted. No pericardial effusion. Unremarkable distal thoracic esophagus. Hepatobiliary: Relative hypertrophy of the left hepatic lobe and caudate lobe with blunting of the free edge of the liver suggests morphologic changes of cirrhosis. No discrete hepatic mass, gallbladder abnormality or  biliary ductal dilatation. Pancreas: No pancreatic mass or inflammatory changes. There are a few punctate calcifications which may represent the sequelae of prior pancreatitis. Spleen: Normal in size without focal abnormality. Adrenals/Urinary Tract: Normal adrenal glands. No evidence of hydronephrosis or nephrolithiasis. Unremarkable ureters. Foley catheter present in the collapsed bladder. Stomach/Bowel: Normal colon, appendix and terminal ileum. The stomach and duodenum are also unremarkable. However, there are several loops of dilated and air-filled small bowel left of midline in the abdomen without a definite transition point which are non-specific. No significant inflammatory changes in the adjacent mesentery and no evidence of interloop fluid. Vascular/Lymphatic: Limited evaluation in the absence of intravenous contrast. No evidence of aneurysm. Calcifications present throughout the abdominal aorta. Abnormal lymphadenopathy is present in the left retroperitoneum beginning in the iliac chain. Multiple hypertrophic lymph nodes are present in the left common and external iliac nodal stations as well as in the left superficial inguinal region. Index lymph nodes as follows: Left superficial inguinal node 2.0 cm in short axis (image 109 series 2); left deep inguinal lymph node 1.5 cm in short axis (image 96 series 2); left external iliac node measures 2.7 cm in short axis (image 87 series 2); left external iliac node measures 2.2 cm in short axis (image 72 series 2); left common iliac node measures 1.5 cm in short axis (image 61 series 2). There are also mildly prominent, but not definitively enlarged nodes along the right iliac chain. No additional lymphadenopathy is identified. Reproductive: Prostate is unremarkable. Other: No abdominal wall hernia or abnormality. No abdominopelvic ascites. Musculoskeletal: No acute fracture or aggressive appearing lytic or blastic osseous lesion. Multiple subacute to remote  bilateral rib fractures, incompletely imaged. Multilevel degenerative disc disease and bilateral facet arthropathy. Mild bilateral hip joint osteoarthritis. Chronic right L5 pars fracture. IMPRESSION: 1. The primary abnormality is lymphadenopathy along the left iliac and inguinal  nodal stations. Differential considerations include reactive adenopathy if the patient has a significant or chronic infectious/inflammatory process involving the left lower extremity. Additionally, a lymphoproliferative process such as lymphoma, or less likely metastatic adenopathy are considerations. Lymphoma is favored in the absence of a known chronic left lower extremity infectious process. Recommend further evaluation with PET-CT to assess for hypermetabolic activity and to identify a suitable target for tissue sampling. 2. Several loops of gaseous distension of small bowel in the left mid abdomen without significant wall thickening or transition point. Differential considerations include mild gastroenteritis and potentially early or partial small bowel obstruction. 3. Morphologic changes of early hepatic cirrhosis. 4. Aortic valve calcifications. 5. Coronary artery atherosclerotic calcifications. 6.  Aortic Atherosclerosis (ICD10-170.0). 7. Foley catheter in place in the collapsed bladder. 8. Multiple bilateral subacute to remote rib fractures. 9. Multilevel degenerative disc disease and bilateral facet arthropathy. 10. Chronic right L5 pars fracture. 11. Mild bilateral hip joint osteoarthritis. Electronically Signed   By: Malachy Moan M.D.   On: 03/05/2018 09:45   Ct Head Wo Contrast  Result Date: 03/05/2018 CLINICAL DATA:  Altered mental status EXAM: CT HEAD WITHOUT CONTRAST TECHNIQUE: Contiguous axial images were obtained from the base of the skull through the vertex without intravenous contrast. COMPARISON:  None. FINDINGS: Brain: 5 mm hyper density in the anterior superior third ventricle compatible with colloid cyst. No  hydrocephalus. Negative for infarct or hemorrhage. No fluid collection or midline shift. Vascular: Negative for hyperdense vessel Skull: Negative Sinuses/Orbits: Paranasal sinuses clear. Bilateral cataract surgery. 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 No acute abnormality. Electronically Signed   By: Marlan Palau M.D.   On: 03/05/2018 09:26   Mr Foot Left Wo Contrast  Result Date: 03/05/2018 CLINICAL DATA:  Plantar ulceration of the left foot with severe lower extremity edema. Evaluate for osteomyelitis. EXAM: MRI OF THE LEFT FOOT WITHOUT CONTRAST TECHNIQUE: Multiplanar, multisequence MR imaging of the left foot was performed. No intravenous contrast was administered. Osteomyelitis protocol MRI of the foot was obtained, to include the entire foot and ankle. This protocol uses a large field of view to cover the entire foot and ankle, and is suitable for assessing bony structures for osteomyelitis. Due to the large field of view and imaging plane choice, this protocol is less sensitive for assessing small structures such as ligamentous structures of the foot and ankle, compared to a dedicated forefoot or dedicated hindfoot exam. COMPARISON:  Left foot x-rays from same day. FINDINGS: Bones/Joint/Cartilage Cortical irregularity and focal marrow edema with decreased T1 marrow signal in the plantar posterior calcaneus, consistent with osteomyelitis. No fracture or dislocation. Severe pes planus. Hindfoot valgus with degenerative marrow edema and cystic change in the lateral talus and articulating calcaneus. Small amount of marrow edema and irregularity of the distal fibula. Mild osteoarthritis of the first MTP joint. Moderate osteoarthritis of the first IP joint. No joint effusion. Ligaments Grossly intact. Muscles and Tendons Flexor, peroneal and extensor compartment tendons are intact. Severe fatty atrophy of the foot muscles. Soft tissue Large plantar hindfoot  soft tissue ulceration. No fluid collection or hematoma. No soft tissue mass. Moderate skin thickening and soft tissue swelling of the foot. IMPRESSION: 1. Large plantar hindfoot soft tissue ulcer with osteomyelitis of the plantar calcaneus. No abscess. 2. Severe pes planovalgus with evidence of lateral hindfoot impingement. Electronically Signed   By: Obie Dredge M.D.   On: 03/05/2018 13:13   Dg Chest Port 1 View  Result  Date: 03/05/2018 CLINICAL DATA:  Fever and hypoxia. EXAM: PORTABLE CHEST 1 VIEW COMPARISON:  None FINDINGS: There is moderate cardiac enlargement and aortic atherosclerosis. Decreased lung volumes. No pleural effusion or edema. No airspace opacities identified. IMPRESSION: 1. No acute findings. 2. Cardiac enlargement. 3.  Aortic Atherosclerosis (ICD10-I70.0). Electronically Signed   By: Signa Kell M.D.   On: 03/05/2018 07:30   Dg Foot 2 Views Left  Result Date: 03/05/2018 CLINICAL DATA:  Ulcer of left foot. EXAM: LEFT FOOT - 2 VIEW COMPARISON:  None. FINDINGS: There is marked diffuse soft tissue swelling. The bones appear diffusely osteopenic. Large soft tissue ulcer along the plantar surface of the hindfoot and midfoot noted. There is increased sclerosis and periosteal reaction along the plantar surface of the calcaneus which may represent the sequelae of chronic osteomyelitis. IMPRESSION: 1. Large plantar soft tissue ulceration. 2. Increase sclerosis and periosteal reaction along the plantar surface of the calcaneus which may represent chronic osteomyelitis. Acute on chronic osteomyelitis not excluded. This could be better assessed with MRI with contrast material or three-phase bone scintigraphy. Electronically Signed   By: Signa Kell M.D.   On: 03/05/2018 07:58    Assessment and Plan:   Active Problems:   Sepsis (HCC)   Osteomyelitis of ankle or foot, acute, left (HCC)   Atrial fibrillation, chronic (HCC)   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)   Hypertension  --We are asked to see a patient who has known atrial fibrillation that seems to be rate controlled in the setting of sepsis admission with altered mental status from chronic infectious etiology.  The patient is current currently hemodynamically stable.   He is on 12.5 mg twice daily carvedilol which is his home medication --seems to be tolerating well.   He has been on oral DOAC as an outpatient is currently now on IV heparin presumably for possible procedures --  Eliquis only needs to be held 24 hr prior to minor procedures & 48 for more significant procedures (~3 days for spinal/neurologic procedures).  Recommend that we convert back to DOAC once potential recommended procedures are complete.    At this point, he seems to be stable from a cardiac standpoint.  He is on standing dose of Bumex but otherwise seems euvolemic.  Would be nice to have his outside records.  For now we will no active cardiac recommendations.  Cardiology will follow along but not nicely around unless there is active cardiac issues ongoing such as uncontrolled A. fib rate.  Would not be concerned for heart rates in the low 100s in the setting of sepsis.   For questions or updates, please contact [facility] Please consult [email or web address] for contact info under Cardiology/STEMI.   Signed, Bryan Lemma, MD  03/05/2018 3:36 PM

## 2018-03-05 NOTE — ED Notes (Signed)
Pt to MRI via Gerilyn PilgrimJacob

## 2018-03-05 NOTE — Progress Notes (Signed)
CODE SEPSIS - PHARMACY COMMUNICATION  **Broad Spectrum Antibiotics should be administered within 1 hour of Sepsis diagnosis**  Time Code Sepsis Called/Page Received: [id number]  Antibiotics Ordered: vancomycin and Zosyn  Time of 1st antibiotic administration: 0715  Additional action taken by pharmacy:   If necessary, Name of Provider/Nurse Contacted:     Valentina Guhristy, [place] Stegemann D ,PharmD Clinical Pharmacist  03/05/2018  7:14 AM

## 2018-03-05 NOTE — ED Notes (Signed)
Futile attempts made to call wife at hotel, [employer].

## 2018-03-05 NOTE — Progress Notes (Signed)
PHARMACY - PHYSICIAN COMMUNICATION CRITICAL VALUE ALERT - BLOOD CULTURE IDENTIFICATION (BCID)  Alan Dillon is an 67 y.o. male [facility] with a chief complaint of Weakness  Assessment:  Sepsis, BCID (+) for E. Coli   Name of physician (or Provider) Contacted: Dr. Luberta MutterKonidena   Current antibiotics: Vancomycin and Zosyn   Changes to prescribed antibiotics recommended:  Recommendations accepted by provider . Patient will be started on meropenem  Results for orders placed or performed during the hospital encounter of 03/05/18  Blood Culture ID Panel (Reflexed) (Collected: 03/05/2018  6:49 AM)  Result Value Ref Range   Enterococcus species NOT DETECTED NOT DETECTED   Listeria monocytogenes NOT DETECTED NOT DETECTED   Staphylococcus species NOT DETECTED NOT DETECTED   Staphylococcus aureus NOT DETECTED NOT DETECTED   Streptococcus species NOT DETECTED NOT DETECTED   Streptococcus agalactiae NOT DETECTED NOT DETECTED   Streptococcus pneumoniae NOT DETECTED NOT DETECTED   Streptococcus pyogenes NOT DETECTED NOT DETECTED   Acinetobacter baumannii NOT DETECTED NOT DETECTED   Enterobacteriaceae species DETECTED (A) NOT DETECTED   Enterobacter cloacae complex NOT DETECTED NOT DETECTED   Escherichia coli DETECTED (A) NOT DETECTED   Klebsiella oxytoca NOT DETECTED NOT DETECTED   Klebsiella pneumoniae NOT DETECTED NOT DETECTED   Proteus species NOT DETECTED NOT DETECTED   Serratia marcescens NOT DETECTED NOT DETECTED   Carbapenem resistance NOT DETECTED NOT DETECTED   Haemophilus influenzae NOT DETECTED NOT DETECTED   Neisseria meningitidis NOT DETECTED NOT DETECTED   Pseudomonas aeruginosa NOT DETECTED NOT DETECTED   Candida albicans NOT DETECTED NOT DETECTED   Candida glabrata NOT DETECTED NOT DETECTED   Candida krusei NOT DETECTED NOT DETECTED   Candida parapsilosis NOT DETECTED NOT DETECTED   Candida tropicalis NOT DETECTED NOT DETECTED    Gardner CandleSheema M  Katalia Choma, PharmD, BCPS Clinical Pharmacist 03/05/2018 4:24 PM

## 2018-03-06 ENCOUNTER — Inpatient Hospital Stay: Payer: Medicare Other

## 2018-03-06 DIAGNOSIS — I4891 Unspecified atrial fibrillation: Secondary | ICD-10-CM

## 2018-03-06 DIAGNOSIS — L03116 Cellulitis of left lower limb: Secondary | ICD-10-CM

## 2018-03-06 DIAGNOSIS — A419 Sepsis, unspecified organism: Secondary | ICD-10-CM

## 2018-03-06 DIAGNOSIS — I10 Essential (primary) hypertension: Secondary | ICD-10-CM

## 2018-03-06 DIAGNOSIS — M86172 Other acute osteomyelitis, left ankle and foot: Secondary | ICD-10-CM

## 2018-03-06 DIAGNOSIS — E11621 Type 2 diabetes mellitus with foot ulcer: Secondary | ICD-10-CM

## 2018-03-06 DIAGNOSIS — M869 Osteomyelitis, unspecified: Secondary | ICD-10-CM

## 2018-03-06 LAB — BLOOD GAS, ARTERIAL
Acid-Base Excess: 12 mmol/L — ABNORMAL HIGH (ref 0.0–2.0)
Acid-Base Excess: 9.3 mmol/L — ABNORMAL HIGH (ref 0.0–2.0)
Acid-Base Excess: 13.8 mmol/L — ABNORMAL HIGH (ref 0.0–2.0)
BICARBONATE: 38.1 mmol/L — AB (ref 20.0–28.0)
Bicarbonate: 41 mmol/L — ABNORMAL HIGH (ref 20.0–28.0)
Bicarbonate: 42.2 mmol/L — ABNORMAL HIGH (ref 20.0–28.0)
FIO2: 0.36
FIO2: 36
FIO2: 0.28
O2 SAT: 97.7 %
O2 SAT: 96.6 %
O2 Saturation: 98.8 %
PATIENT TEMPERATURE: 37
PCO2 ART: 73 mmHg — AB (ref 32.0–48.0)
PH ART: 7.32 — AB (ref 7.350–7.450)
PH ART: 7.37 (ref 7.350–7.450)
PO2 ART: 104 mmHg (ref 83.0–108.0)
PO2 ART: 132 mmHg — AB (ref 83.0–108.0)
Patient temperature: 37
Patient temperature: 37
pCO2 arterial: 76 mmHg (ref 32.0–48.0)
pCO2 arterial: 74 mmHg (ref 32.0–48.0)
pH, Arterial: 7.34 — ABNORMAL LOW (ref 7.350–7.450)
pO2, Arterial: 89 mmHg (ref 83.0–108.0)

## 2018-03-06 LAB — CBC
HEMATOCRIT: 34.6 % — AB (ref 40.0–52.0)
HEMOGLOBIN: 11.3 g/dL — AB (ref 13.0–18.0)
MCH: 28.4 pg (ref 26.0–34.0)
MCHC: 32.7 g/dL (ref 32.0–36.0)
MCV: 86.8 fL (ref 80.0–100.0)
Platelets: 205 10*3/uL (ref 150–440)
RBC: 3.98 MIL/uL — AB (ref 4.40–5.90)
RDW: 19 % — ABNORMAL HIGH (ref 11.5–14.5)
WBC: 12.5 10*3/uL — AB (ref 3.8–10.6)

## 2018-03-06 LAB — LACTIC ACID, PLASMA: LACTIC ACID, VENOUS: 0.9 mmol/L (ref 0.5–1.9)

## 2018-03-06 LAB — BASIC METABOLIC PANEL
ANION GAP: 11 (ref 5–15)
BUN: 33 mg/dL — ABNORMAL HIGH (ref 8–23)
CALCIUM: 7.8 mg/dL — AB (ref 8.9–10.3)
CO2: 32 mmol/L (ref 22–32)
Chloride: 99 mmol/L (ref 98–111)
Creatinine, Ser: 1.95 mg/dL — ABNORMAL HIGH (ref 0.61–1.24)
GFR, EST AFRICAN AMERICAN: 39 mL/min — AB (ref >60)
GFR, EST NON AFRICAN AMERICAN: 34 mL/min — AB (ref >60)
GLUCOSE: 148 mg/dL — AB (ref 70–99)
POTASSIUM: 3.6 mmol/L (ref 3.5–5.1)
SODIUM: 142 mmol/L (ref 135–145)

## 2018-03-06 LAB — GLUCOSE, CAPILLARY
Glucose-Capillary: 147 mg/dL — ABNORMAL HIGH (ref 70–99)
Glucose-Capillary: 147 mg/dL — ABNORMAL HIGH (ref 70–99)
Glucose-Capillary: 145 mg/dL — ABNORMAL HIGH (ref 70–99)
Glucose-Capillary: 149 mg/dL — ABNORMAL HIGH (ref 70–99)
Glucose-Capillary: 119 mg/dL — ABNORMAL HIGH (ref 70–99)

## 2018-03-06 LAB — URINE CULTURE: Culture: NO GROWTH

## 2018-03-06 LAB — HEPARIN LEVEL (UNFRACTIONATED): HEPARIN UNFRACTIONATED: 1.09 [IU]/mL — AB (ref 0.30–0.70)

## 2018-03-06 LAB — APTT
APTT: 66 s — AB (ref 24–36)
aPTT: 49 seconds — ABNORMAL HIGH (ref 24–36)
aPTT: 59 seconds — ABNORMAL HIGH (ref 24–36)

## 2018-03-06 LAB — MRSA PCR SCREENING: MRSA BY PCR: NEGATIVE

## 2018-03-06 MED ORDER — ORAL CARE MOUTH RINSE
15.0000 mL | Freq: Two times a day (BID) | OROMUCOSAL | Status: DC
  Administered 2018-03-06 – 2018-03-14 (×9): 15 mL via OROMUCOSAL

## 2018-03-06 MED ORDER — PROMETHAZINE HCL 25 MG/ML IJ SOLN
12.5000 mg | Freq: Four times a day (QID) | INTRAMUSCULAR | Status: DC | PRN
  Administered 2018-03-06 (×3): 12.5 mg via INTRAVENOUS
  Filled 2018-03-06 (×3): qty 1

## 2018-03-06 MED ORDER — PHENOL 1.4 % MT LIQD
1.0000 | OROMUCOSAL | Status: DC | PRN
  Filled 2018-03-06: qty 177

## 2018-03-06 MED ORDER — SODIUM CHLORIDE 0.45 % IV SOLN
INTRAVENOUS | Status: DC
  Administered 2018-03-06 – 2018-03-09 (×5): via INTRAVENOUS

## 2018-03-06 MED ORDER — HEPARIN BOLUS VIA INFUSION
3600.0000 [IU] | Freq: Once | INTRAVENOUS | Status: AC
  Administered 2018-03-06: 3600 [IU] via INTRAVENOUS
  Filled 2018-03-06: qty 3600

## 2018-03-06 MED ORDER — PROMETHAZINE HCL 25 MG/ML IJ SOLN: 25.0000 mg | Freq: Four times a day (QID) | INTRAMUSCULAR | Status: DC | PRN

## 2018-03-06 MED ORDER — IPRATROPIUM BROMIDE 0.02 % IN SOLN
0.5000 mg | Freq: Four times a day (QID) | RESPIRATORY_TRACT | Status: DC
  Administered 2018-03-06 – 2018-03-08 (×9): 0.5 mg via RESPIRATORY_TRACT
  Filled 2018-03-06 (×9): qty 2.5

## 2018-03-06 MED ORDER — METOPROLOL TARTRATE 5 MG/5ML IV SOLN
2.5000 mg | Freq: Four times a day (QID) | INTRAVENOUS | Status: DC
  Administered 2018-03-06 – 2018-03-09 (×11): 2.5 mg via INTRAVENOUS
  Filled 2018-03-06 (×11): qty 5

## 2018-03-06 NOTE — Progress Notes (Signed)
ANTICOAGULATION CONSULT NOTE - Initial Consult  Pharmacy Consult for heparin Indication: atrial fibrillation  Allergies  Allergen Reactions  . Bactrim [Sulfamethoxazole-Trimethoprim] Other (See Comments)    Caused kidney failure    Patient Measurements: Height: 6\' 1"  (185.4 cm) Weight: (!) 358 lb 14.5 oz (162.8 kg) IBW/kg (Calculated) : 79.9 Heparin Dosing Weight: 119.9 kg  Vital Signs: Temp: 98.4 F (36.9 C) (07/15 2000) Temp Source: Oral (07/15 2000) BP: 127/54 (07/15 2300) Pulse Rate: 76 (07/15 2300)  Labs: Recent Labs    03/05/18 0646 03/05/18 1336  03/06/18 0559 03/06/18 1547 03/06/18 2252  HGB 12.6*  --   --  11.3*  --   --   HCT 38.9*  --   --  34.6*  --   --   PLT 272  --   --  205  --   --   APTT  --  35   < > 49* 66* 59*  LABPROT 17.2*  --   --   --   --   --   INR 1.42  --   --   --   --   --   HEPARINUNFRC  --  1.61*  --  1.09*  --   --   CREATININE 1.86*  --   --  1.95*  --   --   TROPONINI 0.03*  --   --   --   --   --    < > = values in this interval not displayed.    Estimated Creatinine Clearance: 58.8 mL/min (A) (by C-G formula based on SCr of 1.95 mg/dL (H)).   Medical History: Past Medical History:  Diagnosis Date  . Atrial fibrillation, chronic (HCC)    On apixaban  . CKD (chronic kidney disease), stage III (HCC)   . Diabetes mellitus type 2 in obese (HCC)   . 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 History of hemodialysis    Bactrim mediated  Acute renal failure  . Hypertension   . Morbid obesity with BMI of 45.0-49.9, adult (HCC)   . Osteomyelitis of ankle or foot, acute, left (HCC)    chronic    Medications:  Infusions:  . sodium chloride 75 mL/hr at 03/06/18 1720  . heparin 2,100 Units/hr (03/06/18 1720)  . meropenem (MERREM) IV Stopped (03/06/18 2209)    67 yom cc weakness with PMH DM, AF on Eliquis PTA, CHF, HTN. Per RN patient and wife are both unable to provide  detailed information regarding last Eliquis dose. Pharmacy consulted to dose heparin for AF. Baseline aPTT, PT/INR, and heparin level are ordered - Eliquis has been discontinued.   Update: Per floor RN, pt last took dose last night, no dose yet today of apixaban.   Goal of Therapy:  Heparin level 0.3-0.7 units/ml aPTT 66 to 102 seconds Monitor platelets by anticoagulation protocol: Yes   Assessment/Plan:  APTT at low end of goal. Will increase infusion to 2100 units/hr and recheck aPTT in 6 hours.   07/15 @ 2300 aPTT 59 subtherapeutic. Considering patient is going in for amputation tomorrow will omit bolus and increase rate to 2250 units/hr and will recheck aPTT/HL @ 0800.  Thomasene Rippleavid  Nat Lowenthal, Pharm.D, BCPS Clinical Pharmacist 03/06/2018,11:45 PM

## 2018-03-06 NOTE — Progress Notes (Signed)
RN called to room / pt c/o nausea and vomiting/ pt noted to have labored breathing and change in mental status/  Vitals stable/ no change on tele/ Dr. Dot BeenSainaini and  Rapid Response called/ ABG obtained- BIPAP needed/ transferred to CCU 15/ report given

## 2018-03-06 NOTE — Significant Event (Signed)
Rapid Response Event Note  Overview: Time Called: 1217 Arrival Time: 1218 Event Type: Respiratory, Neurologic  Initial Focused Assessment: called for rapid response in 251 for decreased loc, resp distress.   Interventions: Dr Cherlynn KaiserSainani at bedside to assess pt, abg drawn, tx to stepdown for bipap and closer monitoring. Sainani updated intensivist Sherryll BurgerShah.  Plan of Care (if not transferred):  Event Summary: Name of Physician Notified: Dr Cherlynn KaiserSainani at 1220  Name of Consulting Physician Notified: Dr Vira Browns Shah at 1230  Outcome: Transferred (Comment)(tx to ICU for bipap)  Event End Time: 1245  Alan Dillon A

## 2018-03-06 NOTE — Progress Notes (Signed)
Patient ID: Alan Dillon, male   DOB: 11/23/1950, 67 y.o.   MRN: [id number] Subjective: Patient seen.  Unresponsive.  Objective: Foot bandaged with just some mild drainage through the bandage.  Assessment: Osteomyelitis left calcaneus.  Plan: I had discussed with the patient yesterday debriding the infected bone on the left heel tomorrow.  We did discuss possible risks and complications including inability to heal due to his diabetes or infection as well as significant risk for loss of his leg.  I did discuss his potential surgery tomorrow with Dr. Sainani who with this point thinks that we may still be able to proceed.  We will have the patient n.p.o. after midnight and place an order for consent for debridement infected soft tissue and bone left foot for tomorrow, with the understanding that we may have to postpone pending clearance from medicine and anesthesia. 

## 2018-03-06 NOTE — Progress Notes (Signed)
ANTICOAGULATION CONSULT NOTE - Initial Consult  Pharmacy Consult for heparin Indication: atrial fibrillation  Allergies  Allergen Reactions  . Bactrim [Sulfamethoxazole-Trimethoprim] Other (See Comments)    Caused kidney failure    Patient Measurements: Height: 6\' 1"  (185.4 cm) Weight: (!) 358 lb 14.5 oz (162.8 kg) IBW/kg (Calculated) : 79.9 Heparin Dosing Weight: 119.9 kg  Vital Signs: Temp: 98.1 F (36.7 C) (07/15 1600) Temp Source: Oral (07/15 1600) BP: 131/58 (07/15 1600) Pulse Rate: 86 (07/15 1600)  Labs: Recent Labs    03/05/18 0646  03/05/18 1336 03/05/18 2235 03/06/18 0559 03/06/18 1547  HGB 12.6*  --   --   --  11.3*  --   HCT 38.9*  --   --   --  34.6*  --   PLT 272  --   --   --  205  --   APTT  --    < > 35 50* 49* 66*  LABPROT 17.2*  --   --   --   --   --   INR 1.42  --   --   --   --   --   HEPARINUNFRC  --   --  1.61*  --  1.09*  --   CREATININE 1.86*  --   --   --  1.95*  --   TROPONINI 0.03*  --   --   --   --   --    < > = values in this interval not displayed.    Estimated Creatinine Clearance: 58.8 mL/min (A) (by C-G formula based on SCr of 1.95 mg/dL (H)).   Medical History: Past Medical History:  Diagnosis Date  . Atrial fibrillation, chronic (HCC)    On apixaban  . CKD (chronic kidney disease), stage III (HCC)   . Diabetes mellitus type 2 in obese (HCC)   . 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 History of hemodialysis    Bactrim mediated  Acute renal failure  . Hypertension   . Morbid obesity with BMI of 45.0-49.9, adult (HCC)   . Osteomyelitis of ankle or foot, acute, left (HCC)    chronic    Medications:  Infusions:  . heparin 2,000 Units/hr (03/06/18 0941)  . meropenem (MERREM) IV Stopped (03/06/18 1459)    67 yom cc weakness with PMH DM, AF on Eliquis PTA, CHF, HTN. Per RN patient and wife are both unable to provide detailed information regarding last Eliquis  dose. Pharmacy consulted to dose heparin for AF. Baseline aPTT, PT/INR, and heparin level are ordered - Eliquis has been discontinued.   Update: Per floor RN, pt last took dose last night, no dose yet today of apixaban.   Goal of Therapy:  Heparin level 0.3-0.7 units/ml aPTT 66 to 102 seconds Monitor platelets by anticoagulation protocol: Yes   Assessment/Plan:  APTT at low end of goal. Will increase infusion to 2100 units/hr and recheck aPTT in 6 hours.   Luisa Harthristy, Reneta Niehaus D, Pharm.D, BCPS Clinical Pharmacist 03/06/2018,4:45 PM

## 2018-03-06 NOTE — Consult Note (Signed)
[facility]      Name: Alan Dillon MRN: [id number] DOB: 14-May-1951    ADMISSION DATE:  03/05/2018 CONSULTATION DATE:  03/06/2018  REFERRING MD : Luberta MutterKONIDENA   CHIEF COMPLAINT:   Fall   HISTORY OF PRESENT ILLNESS   67 year old male with chronic ulcer of left foot,  CKD stage III, insulin dependent diabetes mellitus, atrial fibrillation, history of bladder cancer, and morbid obesity who presented to the ED on 03/05/2018 after he slid out of the bed in his hotel room. Patient's wife stated he was feeling nauseated on 03/04/2018 evening. Patient noted to be febrile (104.1) by EMS with an initial lactate of 3.0. Patient had an altered mental status in the emergency department and was lethargic. WBC of 21.3. Patient has history of chronic left calcaneal ulceration and osteomyelitis. History of surgical debridement in 2016. Patient's wife provides his wound care and endorses no recent changes in the patient's wound.  Patient has baseline creatinine of 2 and sees a nephrologist at home. Patient does have a cardiologist and patient's wife states he has a diagnosis of congestive heart failure with chronic atrial fibrillation. Patient is on bumex, coreg, and apixaban. Patient's wife states that he uses 2L of 02 on nasal cannula at night and was supposed to be evaluated for sleep apnea this Wednesday. Patient's wife endorses that the patient uses a scooter to get around and rarely ambulates.   Upon evaluation in the ICU, the patient is altered and lethargic but able to answer questions. Patient states he "aches all over" and is very nauseated. Patient endorses vomiting since last night and has been having diarrhea. Last bowel movement was last night and he states it was runny.    SIGNIFICANT EVENTS    Patient had episode of respiratory distress on the floor with vomiting and altered mental status. ABG results showed pH of 7.34 and CO2 of 76. Patient placed on BiPAP prior  to my evaluation and unable to tolerate BiPAP secondary to nausea.     PAST MEDICAL HISTORY    :  Past Medical History:  Diagnosis Date  . Atrial fibrillation, chronic (HCC)    On apixaban  . CKD (chronic kidney disease), stage III (HCC)   . Diabetes mellitus type 2 in obese (HCC)   . 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 History of hemodialysis    Bactrim mediated  Acute renal failure  . Hypertension   . Morbid obesity with BMI of 45.0-49.9, adult (HCC)   . Osteomyelitis of ankle or foot, acute, left (HCC)    chronic   Past Surgical History:  Procedure Laterality Date  . DEBRIDEMENT  FOOT Left    Prior to Admission medications   Medication Sig Start Date End Date Taking? Authorizing Provider  acetaminophen (TYLENOL) 500 MG tablet Take 500 mg by mouth every 6 (six) hours as needed for mild pain or fever.   Yes [provider]  albuterol (PROVENTIL HFA;VENTOLIN HFA) 108 (90 Base) MCG/ACT inhaler Inhale 2 puffs into the lungs 2 (two) times daily.   Yes [provider]  ammonium lactate (LAC-HYDRIN) 12 % lotion Apply 1 application topically as needed for dry skin (on legs).   Yes [provider]  apixaban (ELIQUIS) 5 MG TABS tablet Take 5 mg by mouth 2 (two) times daily.   Yes [provider]  atorvastatin (LIPITOR) 10 MG tablet Take 10 mg  by mouth every evening.   Yes [provider]  brimonidine (ALPHAGAN) 0.2 % ophthalmic solution Place 1 drop into both eyes 2 (two) times daily.   Yes [provider]  bumetanide (BUMEX) 2 MG tablet Take 2 mg by mouth 2 (two) times daily.   Yes [provider]  carvedilol (COREG) 12.5 MG tablet Take 12.5 mg by mouth 2 (two) times daily.   Yes [provider]  cetirizine (ZYRTEC) 10 MG tablet Take 10 mg by mouth daily.   Yes [provider]  Cholecalciferol (D3-1000) 1000 units capsule Take 1,000 Units by mouth  daily.   Yes [provider]  clotrimazole-betamethasone (LOTRISONE) cream Apply 1 application topically 2 (two) times daily as needed (to skin folds).   Yes [provider]  famotidine (PEPCID) 20 MG tablet Take 20 mg by mouth every evening.    Yes [provider]  ferrous sulfate 325 (65 FE) MG tablet Take 325 mg by mouth 2 (two) times daily.   Yes [provider]  gabapentin (NEURONTIN) 300 MG capsule Take 300 mg by mouth 2 (two) times daily.   Yes [provider]  hydrALAZINE (APRESOLINE) 25 MG tablet Take 25 mg by mouth 3 (three) times daily.   Yes [provider]  insulin aspart (NOVOLOG FLEXPEN) 100 UNIT/ML FlexPen Inject 36 Units into the skin 3 (three) times daily before meals. According to sliding scale if sugars above 150.   Yes [provider]  Insulin Degludec (TRESIBA FLEXTOUCH) 200 UNIT/ML SOPN Inject 56 Units into the skin 2 (two) times daily.   Yes [provider]  metFORMIN (GLUCOPHAGE-XR) 500 MG 24 hr tablet Take 500 mg by mouth 2 (two) times daily.   Yes [provider]  Multiple Vitamins-Minerals (MULTIVITAMIN WITH MINERALS) tablet Take 1 tablet by mouth daily.   Yes [provider]  Probiotic Product (PROBIOTIC PO) Take 1 tablet by mouth daily.   Yes [provider]   Allergies  Allergen Reactions  . Bactrim [Sulfamethoxazole-Trimethoprim] Other (See Comments)    Caused kidney failure     FAMILY HISTORY   Family History  Problem Relation Age of Onset  . Stroke Mother   . Diabetes Father       SOCIAL HISTORY    reports that he has never smoked. He has never used smokeless tobacco. His alcohol and drug histories are not on file.  Review of Systems  Unable to perform ROS: Acuity of condition  Constitutional: Positive for fever.       Generalized aches  Respiratory: Positive for shortness of breath.   Cardiovascular: Negative for chest pain.  Gastrointestinal:  Positive for diarrhea, nausea and vomiting.      VITAL SIGNS    Temp:  [97.4 F (36.3 C)-98.9 F (37.2 C)] 97.9 F (36.6 C) (07/15 1244) Pulse Rate:  [71-100] 100 (07/15 1300) Resp:  [15-22] 15 (07/15 1300) BP: (109-156)/(29-95) 152/70 (07/15 1300) SpO2:  [95 %-100 %] 95 % (07/15 1300) Weight:  [162.8 kg (358 lb 14.5 oz)-167.2 kg (368 lb 8 oz)] 162.8 kg (358 lb 14.5 oz) (07/15 1244)  INTAKE / OUTPUT:  Intake/Output Summary (Last 24 hours) at 03/06/2018 1322 Last data filed at 03/06/2018 0300 Gross per 24 hour  Intake -  Output 800 ml  Net -800 ml       PHYSICAL EXAM   Physical Exam  Constitutional:  Appears uncomfortable in bed, in acute distress  HENT:  Head: Normocephalic and atraumatic.  Eyes: Pupils  are equal, round, and reactive to light. EOM are normal.  Cardiovascular:  Irregular rhythm, regular rate. No murmur, rubs, or gallop. S1 and S2. 2+ dorsalis pedis pulses bilaterally  Pulmonary/Chest: He is in respiratory distress. He has no wheezes.  Increased work of breathing, tachypnea  Abdominal: He exhibits distension (mild). There is no tenderness. There is no guarding.  Musculoskeletal: He exhibits edema (bilateral lower leg).  Lymphadenopathy:    He has no cervical adenopathy.  Neurological:  Lethargic, responds to verbal commands.   Skin: Skin is warm and dry. Capillary refill takes less than 2 seconds.  Calcaneal ulcer of left foot, chronic        LABS   LABS:  CBC Recent Labs  Lab 03/05/18 0646 03/06/18 0559  WBC 21.3* 12.5*  HGB 12.6* 11.3*  HCT 38.9* 34.6*  PLT 272 205   Coag's Recent Labs  Lab 03/05/18 0646 03/05/18 1336 03/05/18 2235 03/06/18 0559  APTT  --  35 50* 49*  INR 1.42  --   --   --    BMET Recent Labs  Lab 03/05/18 0646 03/06/18 0559  NA 141 142  K 3.9 3.6  CL 101 99  CO2 28 32  BUN 29* 33*  CREATININE 1.86* 1.95*  GLUCOSE 173* 148*   Electrolytes Recent Labs  Lab 03/05/18 0646 03/06/18 0559    CALCIUM 8.4* 7.8*   Sepsis Markers Recent Labs  Lab 03/05/18 0646 03/05/18 0957  LATICACIDVEN 3.0* 1.9   ABG Recent Labs  Lab 03/05/18 0720 03/06/18 1220  PHART 7.41 7.34*  PCO2ART 50* 76*  PO2ART 102 104   Liver Enzymes Recent Labs  Lab 03/05/18 0646  AST 33  ALT 18  ALKPHOS 81  BILITOT 0.7  ALBUMIN 3.4*   Cardiac Enzymes Recent Labs  Lab 03/05/18 0646  TROPONINI 0.03*   Glucose Recent Labs  Lab 03/05/18 1638 03/05/18 2134 03/06/18 0734 03/06/18 1155 03/06/18 1238  GLUCAP 125* 166* 147* 147* 145*     Recent Results (from the past 240 hour(s))  Blood Culture (routine x 2)     Status: None (Preliminary result)   Collection Time: 03/05/18  6:47 AM  Result Value Ref Range Status   Specimen Description BLOOD RIGHT FOREARM  Final   Special Requests   Final    BOTTLES DRAWN AEROBIC AND ANAEROBIC Blood Culture adequate volume   Culture  Setup Time   Final    GRAM NEGATIVE RODS IN BOTH AEROBIC AND ANAEROBIC BOTTLES CRITICAL RESULT CALLED TO, READ BACK BY AND VERIFIED WITH: SHEEMA HALLAJI @ 1611 ON 03/05/2018 BY CAF Performed at [facility], [address] [place], [place] [address]    Culture GRAM NEGATIVE RODS  Final   Report Status PENDING  Incomplete  Blood Culture (routine x 2)     Status: Abnormal (Preliminary result)   Collection Time: 03/05/18  6:49 AM  Result Value Ref Range Status   Specimen Description   Final    BLOOD RIGHT HAND Performed at [facility], [address] [place], [place] [address]    Special Requests   Final    BOTTLES DRAWN AEROBIC AND ANAEROBIC Blood Culture results may not be optimal due to an excessive volume of blood received in culture bottles Performed at [facility], [address] [place], [place] [address]    Culture  Setup Time   Final    IN BOTH AEROBIC AND ANAEROBIC BOTTLES GRAM NEGATIVE RODS CRITICAL RESULT CALLED TO, READ BACK BY  AND VERIFIED WITH: SHEEMA HALLAJI @ 1611  ON 03/05/2018 BY CAF    Culture (A)  Final    ESCHERICHIA COLI SUSCEPTIBILITIES TO FOLLOW Performed at [facility], [address] [place], [place] [address]    Report Status PENDING  Incomplete  Blood Culture ID Panel (Reflexed)     Status: Abnormal   Collection Time: 03/05/18  6:49 AM  Result Value Ref Range Status   Enterococcus species NOT DETECTED NOT DETECTED Final   Listeria monocytogenes NOT DETECTED NOT DETECTED Final   Staphylococcus species NOT DETECTED NOT DETECTED Final   Staphylococcus aureus NOT DETECTED NOT DETECTED Final   Streptococcus species NOT DETECTED NOT DETECTED Final   Streptococcus agalactiae NOT DETECTED NOT DETECTED Final   Streptococcus pneumoniae NOT DETECTED NOT DETECTED Final   Streptococcus pyogenes NOT DETECTED NOT DETECTED Final   Acinetobacter baumannii NOT DETECTED NOT DETECTED Final   Enterobacteriaceae species DETECTED (A) NOT DETECTED Final    Comment: Enterobacteriaceae represent a large family of gram-negative bacteria, not a single organism. CRITICAL RESULT CALLED TO, READ BACK BY AND VERIFIED WITH: SHEEMA HALLAJI @ 1611 ON 03/05/2018 BY CAF    Enterobacter cloacae complex NOT DETECTED NOT DETECTED Final   Escherichia coli DETECTED (A) NOT DETECTED Final    Comment: CRITICAL RESULT CALLED TO, READ BACK BY AND VERIFIED WITH: SHEEMA HALLAJI @ 1611 ON 03/05/2018 BY CAF    Klebsiella oxytoca NOT DETECTED NOT DETECTED Final   Klebsiella pneumoniae NOT DETECTED NOT DETECTED Final   Proteus species NOT DETECTED NOT DETECTED Final   Serratia marcescens NOT DETECTED NOT DETECTED Final   Carbapenem resistance NOT DETECTED NOT DETECTED Final   Haemophilus influenzae NOT DETECTED NOT DETECTED Final   Neisseria meningitidis NOT DETECTED NOT DETECTED Final   Pseudomonas aeruginosa NOT DETECTED NOT DETECTED Final   Candida albicans NOT DETECTED NOT DETECTED Final   Candida glabrata NOT DETECTED NOT DETECTED Final   Candida krusei NOT DETECTED  NOT DETECTED Final   Candida parapsilosis NOT DETECTED NOT DETECTED Final   Candida tropicalis NOT DETECTED NOT DETECTED Final    Comment: Performed at [facility], [address], [place], [place] [address]  Urine culture     Status: None   Collection Time: 03/05/18  7:19 AM  Result Value Ref Range Status   Specimen Description   Final    URINE, RANDOM Performed at [facility], [address] [place], [place] [address]    Special Requests   Final    NONE Performed at [facility], [address] [place], [place] [address]    Culture   Final    NO GROWTH Performed at [facility], [address] [place], [place] [address]    Report Status 03/06/2018 FINAL  Final     Current Facility-Administered Medications:  .  acetaminophen (TYLENOL) tablet 650 mg, 650 mg, Oral, Q6H PRN **OR** acetaminophen (TYLENOL) suppository 650 mg, 650 mg, Rectal, Q6H PRN, Katha Hamming, MD .  albuterol (PROVENTIL) (2.5 MG/3ML) 0.083% nebulizer solution 3 mL, 3 mL, Inhalation, BID, Katha Hamming, MD, 3 mL at 03/06/18 0729 .  atorvastatin (LIPITOR) tablet 10 mg, 10 mg, Oral, QPM, Katha Hamming, MD, 10 mg at 03/05/18 1728 .  bisacodyl (DULCOLAX) EC tablet 5 mg, 5 mg, Oral, Daily PRN, Katha Hamming, MD .  brimonidine (ALPHAGAN) 0.2 % ophthalmic solution 1 drop, 1 drop, Both Eyes, BID, Katha Hamming, MD, 1 drop at 03/06/18 1001 .  bumetanide (BUMEX) tablet  2 mg, 2 mg, Oral, BID, Katha Hamming, MD, 2 mg at 03/06/18 1001 .  carvedilol (COREG) tablet 12.5 mg, 12.5 mg, Oral, BID, Katha Hamming, MD, 12.5 mg at 03/06/18 0940 .  docusate sodium (COLACE) capsule 100 mg, 100 mg, Oral, BID, Katha Hamming, MD, 100 mg at 03/06/18 0940 .  ferrous sulfate tablet 325 mg, 325 mg, Oral, BID, Katha Hamming, MD, 325 mg at 03/06/18 0940 .  heparin ADULT infusion 100 units/mL (25000 units/243mL sodium chloride 0.45%), 2,000  Units/hr, Intravenous, Continuous, Olene Floss, RPH, Last Rate: 20 mL/hr at 03/06/18 0941, 2,000 Units/hr at 03/06/18 0941 .  HYDROcodone-acetaminophen (NORCO/VICODIN) 5-325 MG per tablet 1-2 tablet, 1-2 tablet, Oral, Q4H PRN, Katha Hamming, MD, 2 tablet at 03/05/18 2316 .  insulin aspart (novoLOG) injection 0-15 Units, 0-15 Units, Subcutaneous, TID WC, Katha Hamming, MD, 2 Units at 03/06/18 1211 .  insulin aspart (novoLOG) injection 3 Units, 3 Units, Subcutaneous, TID WC, Katha Hamming, MD, 3 Units at 03/05/18 1729 .  meropenem (MERREM) 1 g in sodium chloride 0.9 % 100 mL IVPB, 1 g, Intravenous, Q8H, Hallaji, Sheema M, RPH, Stopped at 03/06/18 0600 .  multivitamin with minerals tablet 1 tablet, 1 tablet, Oral, Daily, Katha Hamming, MD, 1 tablet at 03/06/18 0940 .  ondansetron (ZOFRAN) tablet 4 mg, 4 mg, Oral, Q6H PRN **OR** ondansetron (ZOFRAN) injection 4 mg, 4 mg, Intravenous, Q6H PRN, Katha Hamming, MD, 4 mg at 03/06/18 1006 .  phenol (CHLORASEPTIC) mouth spray 1 spray, 1 spray, Mouth/Throat, PRN, Sainani, Rolly Pancake, MD .  promethazine (PHENERGAN) injection 12.5 mg, 12.5 mg, Intravenous, Q6H PRN, Arnaldo Natal, MD, 12.5 mg at 03/06/18 1211 .  traZODone (DESYREL) tablet 25 mg, 25 mg, Oral, QHS PRN, Katha Hamming, MD  IMAGING    US Renal  Result Date: 03/06/2018 CLINICAL DATA:  67 year old male with chronic kidney disease. Acute kidney failure. Initial encounter. EXAM: RENAL / URINARY TRACT ULTRASOUND COMPLETE COMPARISON:  03/05/2018 CT. FINDINGS: Right Kidney: Length: 13.2 cm. Echogenicity within normal limits. No mass or hydronephrosis visualized. Minimal perinephric fluid noted. Left Kidney: Length: 13.1 cm. Echogenicity within normal limits. No mass or hydronephrosis visualized. Minimal perinephric fluid noted. Bladder: Decompressed by Foley catheter. IMPRESSION: No hydronephrosis. Electronically Signed   By: Lacy Duverney M.D.   On: 03/06/2018  12:09      MAJOR EVENTS/TEST RESULTS: -CT head: negative -CT abdomen showed potential early small bowel obstruction and left inguinal LAD consistent with left plantar calcaneal osteomyelitis -ABG: pH 7.34 and CO2 of 76 -Renal US: negative -MRI of left foot: shows osteomyelitis of plantar calcaneus without abscess   MICRO DATA: MRSA PCR; pending  Urine: U/A without evidence of infection, sent for culture Blood: E. Coli positive    ANTIMICROBIALS: Meropenem    ASSESSMENT/PLAN   67 year old male with osteomyelitis of left plantar calcaneus,  CKD stage III, insulin dependent diabetes mellitus, atrial fibrillation, history of bladder cancer, and morbid obesity who presented to the ED for fall on 03/05/2018. Noted to be febrile with WBC of 21.3. Patient with osteomyelitis of left plantar calcaneus which has been a chronic problem for him. Last debridement was in 2016.   PULMONARY A:Acute on chronic Hypercarbic respiratory failure P: BiPAP if patient is able to tolerate. Repeat ABG. If worsening hypercarbia consider possible intubation secondary to inability to tolerate BiPAP. May be secondary to partial bowel obstruction -Start atrovent -Echo ordered  CARDIOVASCULAR A: Chronic atrial fibrillation with congestive heart failure P: Continue Heparin drip. Hold apixaban for  possible debridement.  -d/c coreg, start IV metoprolol  RENAL A:  CKD stage III with AKI secondary to E. Coli bacteremia P:  Appreciate nephrology consult. Renal US normal. Hold IV fluids and bumetanide per nephrology .  GASTROINTESTINAL A:  Gastric distention with early possible bowel obstruction P:  AXR shows gastric distention. NG tube placed 7/15. CT scan 7/14 shows partial bowel obstruction. Awaiting confirmation of NG tube placement. Surgery consult.  -hold lipitor -hold dulcolax  HEMATOLOGIC A:  Leukocytosis trending down: 21.3 (7/14)-->12.5K (7/15), Hemoglobin 12.6-->11.3 P: Continue heparin for  anticoagulation. Appreciate vascular surgery consult. Dr. Wyn Quaker considering angiography on Wednesday or Thursday after consultation with podiatry. High risk for amputation per Dr. Driscilla Grammes note.    INFECTIOUS A:  Osteomyelitis of Left plantar calcaneus with E. Coli bacteremia and sepsis.  P: Appreciate podiatry consult. Tentative plan to debride wound on Tuesday pending anticoagulation status. Patient d/c apixaban and started on heparin. Lactate and WBC trending down.  BCx2: Positive for E.coli, repeat blood cultures tomorrow UC: Pending Abx: meropenem, started 03/05/2018   ENDOCRINE A:  Insulin dependent diabetes mellitus P:  Continue Novolog, monitor blood sugar. Latest value 145. Hold metformin secondary to renal failure.   NEUROLOGIC A:  Acute encephalopathy P: likely secondary to bacteremia and acute on chronic hypercarbic respiratory failure. Patient unable to tolerate BiPAP. Repeat ABG.   I have personally obtained a history, examined the patient, evaluated laboratory and independently reviewed  imaging results, formulated the assessment and plan and placed orders.  The Patient requires high complexity decision making for assessment and support, frequent evaluation and titration of therapies, application of advanced monitoring technologies and extensive interpretation of multiple databases. Critical Care Time devoted to patient care services described in this note is 60 minutes.   Overall, patient is critically ill, prognosis is guarded. Patient at high risk for cardiac arrest and death.

## 2018-03-06 NOTE — Progress Notes (Signed)
ANTICOAGULATION CONSULT NOTE - Initial Consult  Pharmacy Consult for heparin Indication: atrial fibrillation  Allergies  Allergen Reactions  . Bactrim [Sulfamethoxazole-Trimethoprim] Other (See Comments)    Caused kidney failure    Patient Measurements: Height: 6\' 1"  (185.4 cm) Weight: (!) 368 lb 8 oz (167.2 kg) IBW/kg (Calculated) : 79.9 Heparin Dosing Weight: 119.9 kg  Vital Signs: Temp: 97.4 F (36.3 C) (07/15 0735) Temp Source: Oral (07/15 0735) BP: 156/81 (07/15 0735) Pulse Rate: 87 (07/15 0735)  Labs: Recent Labs    03/05/18 0646 03/05/18 1336 03/05/18 2235 03/06/18 0559  HGB 12.6*  --   --  11.3*  HCT 38.9*  --   --  34.6*  PLT 272  --   --  205  APTT  --  35 50* 49*  LABPROT 17.2*  --   --   --   INR 1.42  --   --   --   HEPARINUNFRC  --  1.61*  --  1.09*  CREATININE 1.86*  --   --  1.95*  TROPONINI 0.03*  --   --   --     Estimated Creatinine Clearance: 59.7 mL/min (A) (by C-G formula based on SCr of 1.95 mg/dL (H)).   Medical History: Past Medical History:  Diagnosis Date  . Atrial fibrillation, chronic (HCC)    On apixaban  . CKD (chronic kidney disease), stage III (HCC)   . Diabetes mellitus type 2 in obese (HCC)   . 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 History of hemodialysis    Bactrim mediated  Acute renal failure  . Hypertension   . Morbid obesity with BMI of 45.0-49.9, adult (HCC)   . Osteomyelitis of ankle or foot, acute, left (HCC)    chronic    Medications:  Infusions:  . heparin 1,800 Units/hr (03/06/18 0809)  . meropenem (MERREM) IV Stopped (03/06/18 0600)    Assessment: 67 yom cc weakness with PMH DM, AF on Eliquis PTA, CHF, HTN. Per RN patient and wife are both unable to provide detailed information regarding last Eliquis dose. Pharmacy consulted to dose heparin for AF. Baseline aPTT, PT/INR, and heparin level are ordered - Eliquis has been discontinued.   Update:  Per floor RN, pt last took dose last night, no dose yet today of apixaban.   Goal of Therapy:  Heparin level 0.3-0.7 units/ml aPTT 66 to 102 seconds Monitor platelets by anticoagulation protocol: Yes   Plan:  APTT low at 49.  Will bolus 3600 units and increase rate to 2000 units/hr. Recheck in 6 hours.  Olene FlossMelissa D Euclide Granito, Pharm.D, BCPS Clinical Pharmacist 03/06/2018,9:08 AM

## 2018-03-06 NOTE — Progress Notes (Signed)
Patient complaining of back discomfort. Medicated as needed. Relief given. Patient complaining of nausea. Medicated with zofran. No relief. Md notified. Patient vomited large amount of light brown liquid. New medication given. Nausea slowly easing. Will monitor

## 2018-03-06 NOTE — Progress Notes (Signed)
[employer] [facility] [place] [facility]   PATIENT NAME: Alan Dillon    MR#:  [id number]  DATE OF BIRTH:  July 17, 1951  SUBJECTIVE:   Patient admitted to the hospital due to sepsis secondary to left foot osteomyelitis with peripheral vascular disease.  Patient earlier today became more lethargic and was noted to be in worsening hypercapnic respiratory failure and therefore transferred to stepdown and placed on BiPAP.  REVIEW OF SYSTEMS:    Review of Systems  Constitutional: Negative for chills and fever.  HENT: Negative for congestion and tinnitus.   Eyes: Negative for blurred vision and double vision.  Respiratory: Positive for shortness of breath. Negative for cough and wheezing.   Cardiovascular: Negative for chest pain, orthopnea and PND.  Gastrointestinal: Positive for nausea. Negative for abdominal pain, diarrhea and vomiting.  Genitourinary: Negative for dysuria and hematuria.  Neurological: Positive for weakness. Negative for dizziness, sensory change and focal weakness.  All other systems reviewed and are negative.   Nutrition: NPO as on Bipap now.  Tolerating Diet: No Tolerating PT: Await Eval.   DRUG ALLERGIES:   Allergies  Allergen Reactions  . Bactrim [Sulfamethoxazole-Trimethoprim] Other (See Comments)    Caused kidney failure    VITALS:  Blood pressure (!) 166/89, pulse 80, temperature 97.9 F (36.6 C), temperature source Axillary, resp. rate 16, height 6\' 1"  (1.854 m), weight (!) 162.8 kg (358 lb 14.5 oz), SpO2 95 %.  PHYSICAL EXAMINATION:   Physical Exam  GENERAL:  67 y.o.-year-old morbidly obese patient lying in bed lethargic but in NAD.  EYES: Pupils equal, round, reactive to light and accommodation. No scleral icterus. Extraocular muscles intact.  HEENT: Head atraumatic, normocephalic. Oropharynx and nasopharynx clear.  NECK:  Supple, no jugular venous distention. No thyroid enlargement, no tenderness.  LUNGS: Normal breath sounds  bilaterally, no wheezing, rales, rhonchi. No use of accessory muscles of respiration.  CARDIOVASCULAR: S1, S2 normal. No murmurs, rubs, or gallops.  ABDOMEN: Soft, nontender, protuberant, nondistended. Bowel sounds present. No organomegaly or mass.  EXTREMITIES: No cyanosis, clubbing, +1-2 edema from the knees and ankles bilaterally. NEUROLOGIC: Cranial nerves II through XII are intact.  Focal motor or sensory deficits appreciated bilaterally, globally weak. PSYCHIATRIC: The patient is alert and oriented x 3.  SKIN: No obvious rash, lesion, or ulcer.  Left foot plantar diabetic foot ulcer with underlying osteomyelitis covered in Kerlix wrap.   LABORATORY PANEL:   CBC Recent Labs  Lab 03/06/18 0559  WBC 12.5*  HGB 11.3*  HCT 34.6*  PLT 205   ------------------------------------------------------------------------------------------------------------------  Chemistries  Recent Labs  Lab 03/05/18 0646 03/06/18 0559  NA 141 142  K 3.9 3.6  CL 101 99  CO2 28 32  GLUCOSE 173* 148*  BUN 29* 33*  CREATININE 1.86* 1.95*  CALCIUM 8.4* 7.8*  AST 33  --   ALT 18  --   ALKPHOS 81  --   BILITOT 0.7  --    ------------------------------------------------------------------------------------------------------------------  Cardiac Enzymes Recent Labs  Lab 03/05/18 0646  TROPONINI 0.03*   ------------------------------------------------------------------------------------------------------------------  RADIOLOGY:  Ct Abdomen Pelvis Wo Contrast  Result Date: 03/05/2018 CLINICAL DATA:  67 year old male with fever of unknown origin, leukocytosis. Evaluate for intra-abdominal source for infection. EXAM: CT ABDOMEN AND PELVIS WITHOUT CONTRAST TECHNIQUE: Multidetector CT imaging of the abdomen and pelvis was performed following the standard protocol without IV contrast. COMPARISON:  None. FINDINGS: Lower chest: Respiratory motion artifact limits evaluation for small pulmonary nodules. Mild  bibasilar subsegmental atelectasis. Incompletely imaged cardiomegaly. Calcifications  of the aortic valve and coronary arteries are noted. No pericardial effusion. Unremarkable distal thoracic esophagus. Hepatobiliary: Relative hypertrophy of the left hepatic lobe and caudate lobe with blunting of the free edge of the liver suggests morphologic changes of cirrhosis. No discrete hepatic mass, gallbladder abnormality or biliary ductal dilatation. Pancreas: No pancreatic mass or inflammatory changes. There are a few punctate calcifications which may represent the sequelae of prior pancreatitis. Spleen: Normal in size without focal abnormality. Adrenals/Urinary Tract: Normal adrenal glands. No evidence of hydronephrosis or nephrolithiasis. Unremarkable ureters. Foley catheter present in the collapsed bladder. Stomach/Bowel: Normal colon, appendix and terminal ileum. The stomach and duodenum are also unremarkable. However, there are several loops of dilated and air-filled small bowel left of midline in the abdomen without a definite transition point which are non-specific. No significant inflammatory changes in the adjacent mesentery and no evidence of interloop fluid. Vascular/Lymphatic: Limited evaluation in the absence of intravenous contrast. No evidence of aneurysm. Calcifications present throughout the abdominal aorta. Abnormal lymphadenopathy is present in the left retroperitoneum beginning in the iliac chain. Multiple hypertrophic lymph nodes are present in the left common and external iliac nodal stations as well as in the left superficial inguinal region. Index lymph nodes as follows: Left superficial inguinal node 2.0 cm in short axis (image 109 series 2); left deep inguinal lymph node 1.5 cm in short axis (image 96 series 2); left external iliac node measures 2.7 cm in short axis (image 87 series 2); left external iliac node measures 2.2 cm in short axis (image 72 series 2); left common iliac node measures 1.5  cm in short axis (image 61 series 2). There are also mildly prominent, but not definitively enlarged nodes along the right iliac chain. No additional lymphadenopathy is identified. Reproductive: Prostate is unremarkable. Other: No abdominal wall hernia or abnormality. No abdominopelvic ascites. Musculoskeletal: No acute fracture or aggressive appearing lytic or blastic osseous lesion. Multiple subacute to remote bilateral rib fractures, incompletely imaged. Multilevel degenerative disc disease and bilateral facet arthropathy. Mild bilateral hip joint osteoarthritis. Chronic right L5 pars fracture. IMPRESSION: 1. The primary abnormality is lymphadenopathy along the left iliac and inguinal nodal stations. Differential considerations include reactive adenopathy if the patient has a significant or chronic infectious/inflammatory process involving the left lower extremity. Additionally, a lymphoproliferative process such as lymphoma, or less likely metastatic adenopathy are considerations. Lymphoma is favored in the absence of a known chronic left lower extremity infectious process. Recommend further evaluation with PET-CT to assess for hypermetabolic activity and to identify a suitable target for tissue sampling. 2. Several loops of gaseous distension of small bowel in the left mid abdomen without significant wall thickening or transition point. Differential considerations include mild gastroenteritis and potentially early or partial small bowel obstruction. 3. Morphologic changes of early hepatic cirrhosis. 4. Aortic valve calcifications. 5. Coronary artery atherosclerotic calcifications. 6.  Aortic Atherosclerosis (ICD10-170.0). 7. Foley catheter in place in the collapsed bladder. 8. Multiple bilateral subacute to remote rib fractures. 9. Multilevel degenerative disc disease and bilateral facet arthropathy. 10. Chronic right L5 pars fracture. 11. Mild bilateral hip joint osteoarthritis. Electronically Signed   By:  Malachy MoanHeath  McCullough M.D.   On: 03/05/2018 09:45   Ct Head Wo Contrast  Result Date: 03/05/2018 CLINICAL DATA:  Altered mental status EXAM: CT HEAD WITHOUT CONTRAST TECHNIQUE: Contiguous axial images were obtained from the base of the skull through the vertex without intravenous contrast. COMPARISON:  None. FINDINGS: Brain: 5 mm hyper density in the anterior superior third  ventricle compatible with colloid cyst. No hydrocephalus. Negative for infarct or hemorrhage. No fluid collection or midline shift. Vascular: Negative for hyperdense vessel Skull: Negative Sinuses/Orbits: Paranasal sinuses clear. Bilateral cataract surgery. 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 No acute abnormality. Electronically Signed   By: Marlan Palau M.D.   On: 03/05/2018 09:26   US Renal  Result Date: 03/06/2018 CLINICAL DATA:  67 year old male with chronic kidney disease. Acute kidney failure. Initial encounter. EXAM: RENAL / URINARY TRACT ULTRASOUND COMPLETE COMPARISON:  03/05/2018 CT. FINDINGS: Right Kidney: Length: 13.2 cm. Echogenicity within normal limits. No mass or hydronephrosis visualized. Minimal perinephric fluid noted. Left Kidney: Length: 13.1 cm. Echogenicity within normal limits. No mass or hydronephrosis visualized. Minimal perinephric fluid noted. Bladder: Decompressed by Foley catheter. IMPRESSION: No hydronephrosis. Electronically Signed   By: Lacy Duverney M.D.   On: 03/06/2018 12:09   Mr Foot Left Wo Contrast  Result Date: 03/05/2018 CLINICAL DATA:  Plantar ulceration of the left foot with severe lower extremity edema. Evaluate for osteomyelitis. EXAM: MRI OF THE LEFT FOOT WITHOUT CONTRAST TECHNIQUE: Multiplanar, multisequence MR imaging of the left foot was performed. No intravenous contrast was administered. Osteomyelitis protocol MRI of the foot was obtained, to include the entire foot and ankle. This protocol uses a large field of view to cover the entire  foot and ankle, and is suitable for assessing bony structures for osteomyelitis. Due to the large field of view and imaging plane choice, this protocol is less sensitive for assessing small structures such as ligamentous structures of the foot and ankle, compared to a dedicated forefoot or dedicated hindfoot exam. COMPARISON:  Left foot x-rays from same day. FINDINGS: Bones/Joint/Cartilage Cortical irregularity and focal marrow edema with decreased T1 marrow signal in the plantar posterior calcaneus, consistent with osteomyelitis. No fracture or dislocation. Severe pes planus. Hindfoot valgus with degenerative marrow edema and cystic change in the lateral talus and articulating calcaneus. Small amount of marrow edema and irregularity of the distal fibula. Mild osteoarthritis of the first MTP joint. Moderate osteoarthritis of the first IP joint. No joint effusion. Ligaments Grossly intact. Muscles and Tendons Flexor, peroneal and extensor compartment tendons are intact. Severe fatty atrophy of the foot muscles. Soft tissue Large plantar hindfoot soft tissue ulceration. No fluid collection or hematoma. No soft tissue mass. Moderate skin thickening and soft tissue swelling of the foot. IMPRESSION: 1. Large plantar hindfoot soft tissue ulcer with osteomyelitis of the plantar calcaneus. No abscess. 2. Severe pes planovalgus with evidence of lateral hindfoot impingement. Electronically Signed   By: Obie Dredge M.D.   On: 03/05/2018 13:13   Dg Chest Port 1 View  Result Date: 03/05/2018 CLINICAL DATA:  Fever and hypoxia. EXAM: PORTABLE CHEST 1 VIEW COMPARISON:  None FINDINGS: There is moderate cardiac enlargement and aortic atherosclerosis. Decreased lung volumes. No pleural effusion or edema. No airspace opacities identified. IMPRESSION: 1. No acute findings. 2. Cardiac enlargement. 3.  Aortic Atherosclerosis (ICD10-I70.0). Electronically Signed   By: Signa Kell M.D.   On: 03/05/2018 07:30   Dg Foot 2 Views  Left  Result Date: 03/05/2018 CLINICAL DATA:  Ulcer of left foot. EXAM: LEFT FOOT - 2 VIEW COMPARISON:  None. FINDINGS: There is marked diffuse soft tissue swelling. The bones appear diffusely osteopenic. Large soft tissue ulcer along the plantar surface of the hindfoot and midfoot noted. There is increased sclerosis and periosteal reaction along the plantar surface of the calcaneus which may represent the sequelae of  chronic osteomyelitis. IMPRESSION: 1. Large plantar soft tissue ulceration. 2. Increase sclerosis and periosteal reaction along the plantar surface of the calcaneus which may represent chronic osteomyelitis. Acute on chronic osteomyelitis not excluded. This could be better assessed with MRI with contrast material or three-phase bone scintigraphy. Electronically Signed   By: Signa Kell M.D.   On: 03/05/2018 07:58     ASSESSMENT AND PLAN:   67 year old male with past medical history of morbid obesity, diabetes, obstructive sleep apnea, hypertension, chronic kidney disease stage III, chronic atrial fibrillation who presented to the hospital due to generalized weakness, lethargy and noted to have sepsis secondary to left foot osteomyelitis.  1.  Sepsis- patient meets criteria given his leukocytosis, hypotension, leukocytosis and noted to have infected left foot diabetic ulcer with osteomyelitis. - Continue IV meropenem for now.  Patient's blood cultures are positive for E. coli but sensitivities are pending. - Urinalysis was negative for UTI.  2.  Acute on chronic respiratory failure with hypercapnia-secondary to underlying obstructive sleep apnea, obesity pickwickian syndrome. - Patient's mental status got a bit worse after renal ultrasound today and his hypercapnia had gotten worse.  Patient transferred to the intensive care unit and placed on BiPAP.  Discussed with intensivist. - Continue further care as per Pulmonary/Intensivist.   3.  Left foot osteomyelitis- seen by podiatry and  plan for surgical debridement tomorrow. -Continue IV meropenem for now.  Follow intraoperative wound cultures and change antibiotics accordingly. - Was seen by vascular surgery and then plan on doing angiogram later this week after podiatry has debrided his wound.  4.  Chronic diastolic CHF-continue carvedilol, Bumex.  5.  CKD stage III-seen by nephrology, creatinine close to baseline we will continue to monitor. -Renal ultrasound showing no evidence of obstruction or hydronephrosis.  6.  Diabetes type 2 without complication-continue sliding scale insulin.  Blood sugar stable.  7.  Hyperlipidemia-continue atorvastatin.  8. Chronic A. Fib - rate controlled.  - hold Eliquis as pt. To have surgery tomorrow. Cont. Heparin gtt, cont. Coreg.    All the records are reviewed and case discussed with Care Management/Social Worker. Management plans discussed with the patient, family and they are in agreement.  CODE STATUS: Full code  DVT Prophylaxis: Heparin gtt  TOTAL Critical Care TIME TAKING CARE OF THIS PATIENT: 30 minutes.   POSSIBLE D/C unclear , DEPENDING ON CLINICAL CONDITION.   Houston Siren M.D on 03/06/2018 at 3:12 PM  Between 7am to 6pm - Pager - [phone]  After 6pm go to [email or web address] - Social research officer, government  [facility]  Office  [phone]  CC: Primary care physician; Tsosie Billing, MD

## 2018-03-06 NOTE — Progress Notes (Addendum)
Inpatient Diabetes Program Recommendations  AACE/ADA: New Consensus Statement on Inpatient Glycemic Control (2019)  Target Ranges:  Prepandial:   less than 140 mg/dL      Peak postprandial:   less than 180 mg/dL (1-2 hours)      Critically ill patients:  140 - 180 mg/dL   Results for Kevan NyLOCKHART, Claudio (MRN [id number]) as of 03/06/2018 08:00  Ref. Range 03/05/2018 16:38 03/05/2018 21:34 03/06/2018 07:34  Glucose-Capillary Latest Ref Range: 70 - 99 mg/dL 829125 (H) 562166 (H) 130147 (H)   Review of Glycemic Control  Diabetes history: DM2 Outpatient Diabetes medications: Tresiba 56 units BID, Novolog 36 units TID with meals, Metformin XR 500 mg BID Current orders for Inpatient glycemic control: Novolog 0-15 units TID with meals, Novolog 3 units TID with meals  Inpatient Diabetes Program Recommendations:  Correction (SSI): Please consider ordering Novolog 0-5 units QHS for bedtime correction scale. HgbA1C: Please consider ordering an A1C to evaluate glycemic control over the past 2-3 months. Insulin-Basal: Do not recommend ordering basal at this time. However, if glucose becomes consistently over 180 mg/dl with Novolog correction scale, please consider ordering Lantus 17 units Q24H (based on 167 kg x 0.1 units).  NOTE: Consult noted and chart reviewed. Glucose has ranged from 125-166 mg/dl since patient arrived at the hospital and no basal insulin has been given yet. Fasting glucose 147 mg/dl today. Recommend adding Novolog bedtime correction and ordering an A1C.  If glucose is consistently greater than 180 mg/dl with Novolog correction, would recommend ordering basal insulin. Went by to talk with patient. However, noted rapid response called on patient around 12:26 today and patient was transferred to ICU due to change in mental status and respiratory distress.  Will attempt to follow up with patient when appropriate.  Thanks, Orlando PennerMarie Sawyer Kahan, RN, MSN, CDE Diabetes Coordinator Inpatient Diabetes  Program [phone] (Team Pager from 8am to 5pm)

## 2018-03-06 NOTE — Consult Note (Signed)
[facility] Vascular Consult Note  MRN : [id number]  Alan Dillon is a 67 y.o. (October 26, 1950) male who presents with chief complaint of  Chief Complaint  Patient presents with  . Weakness  .  History of Present Illness: I am asked to see the patient by Dr. Luberta Mutter for a vascular evaluation for a non-healing wound.  The patient has had this wound for 3 or 4 years now.  It has worsened significantly over the past several months and prompted admission to the hospital this weekend.  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  He is morbidly obese and poorly ambulatory.  The wound is not overly painful.  It is associated with a marked deformity of his foot.  He has had debridement by podiatry about 3 years ago and they have seen him and are planning a debridement tomorrow.  He is on blood thinners for anticoagulation and these have been held.  No trauma, injury, or inciting event that started the wound years ago.  His wife has been doing excellent wound care but it has really not been healing for a long period of time.  No current right foot or leg ulcerations.  Current Facility-Administered Medications  Medication Dose Route Frequency Provider Last Rate Last Dose  . acetaminophen (TYLENOL) tablet 650 mg  650 mg Oral Q6H PRN Katha Hamming, MD       Or  . acetaminophen (TYLENOL) suppository 650 mg  650 mg Rectal Q6H PRN Katha Hamming, MD      . albuterol (PROVENTIL) (2.5 MG/3ML) 0.083% nebulizer solution 3 mL  3 mL Inhalation BID Katha Hamming, MD   3 mL at 03/06/18 0729  . atorvastatin (LIPITOR) tablet 10 mg  10 mg Oral QPM Katha Hamming, MD   10 mg at 03/05/18 1728  . bisacodyl (DULCOLAX) EC tablet 5 mg  5 mg Oral Daily PRN Katha Hamming, MD      . brimonidine (ALPHAGAN) 0.2 % ophthalmic solution 1 drop  1 drop Both Eyes BID Katha Hamming, MD   1 drop at 03/05/18 1727  . bumetanide (BUMEX) tablet 2 mg  2 mg Oral BID  Katha Hamming, MD   2 mg at 03/05/18 1455  . carvedilol (COREG) tablet 12.5 mg  12.5 mg Oral BID Katha Hamming, MD   12.5 mg at 03/05/18 1455  . docusate sodium (COLACE) capsule 100 mg  100 mg Oral BID Katha Hamming, MD      . ferrous sulfate tablet 325 mg  325 mg Oral BID Katha Hamming, MD   325 mg at 03/05/18 1455  . heparin ADULT infusion 100 units/mL (25000 units/236mL sodium chloride 0.45%)  1,800 Units/hr Intravenous Continuous Houston Siren, MD 18 mL/hr at 03/06/18 0809 1,800 Units/hr at 03/06/18 0809  . HYDROcodone-acetaminophen (NORCO/VICODIN) 5-325 MG per tablet 1-2 tablet  1-2 tablet Oral Q4H PRN Katha Hamming, MD   2 tablet at 03/05/18 2316  . insulin aspart (novoLOG) injection 0-15 Units  0-15 Units Subcutaneous TID WC Katha Hamming, MD   1 Units at 03/05/18 1728  . insulin aspart (novoLOG) injection 3 Units  3 Units Subcutaneous TID WC Katha Hamming, MD   3 Units at 03/05/18 1729  . meropenem (MERREM) 1 g in sodium chloride 0.9 % 100 mL IVPB  1 g Intravenous Q8H Hallaji, Sheema M, RPH   Stopped at 03/06/18 0600  . multivitamin with minerals tablet 1 tablet  1 tablet Oral Daily Katha Hamming, MD   1 tablet at  03/05/18 1455  . ondansetron (ZOFRAN) tablet 4 mg  4 mg Oral Q6H PRN Katha Hamming, MD       Or  . ondansetron (ZOFRAN) injection 4 mg  4 mg Intravenous Q6H PRN Katha Hamming, MD   4 mg at 03/06/18 0502  . phenol (CHLORASEPTIC) mouth spray 1 spray  1 spray Mouth/Throat PRN Houston Siren, MD      . promethazine (PHENERGAN) injection 12.5 mg  12.5 mg Intravenous Q6H PRN Arnaldo Natal, MD   12.5 mg at 03/06/18 0616  . traZODone (DESYREL) tablet 25 mg  25 mg Oral QHS PRN Katha Hamming, MD        Past Medical History:  Diagnosis Date  . Atrial fibrillation, chronic (HCC)    On apixaban  . CKD (chronic kidney disease), stage III (HCC)   . Diabetes mellitus type 2 in obese (HCC)   . 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 History of hemodialysis    Bactrim mediated  Acute renal failure  . Hypertension   . Morbid obesity with BMI of 45.0-49.9, adult (HCC)   . Osteomyelitis of ankle or foot, acute, left (HCC)    chronic    Past Surgical History:  Procedure Laterality Date  . DEBRIDEMENT  FOOT Left     Social History No ETOH abuse.  No tobacco use No IVDU Married.  Family History Family History  Problem Relation Age of Onset  . Stroke Mother   . Diabetes Father   No bleeding or clotting disorders.  No porphyrias  Allergies  Allergen Reactions  . Bactrim [Sulfamethoxazole-Trimethoprim] Other (See Comments)    Caused kidney failure     REVIEW OF SYSTEMS (Negative unless checked)  Constitutional: [] Weight loss  [] Fever  [] Chills Cardiac: [] Chest pain   [] Chest pressure   [] Palpitations   [] Shortness of breath when laying flat   [] Shortness of breath at rest   [] Shortness of breath with exertion. Vascular:  [] Pain in legs with walking   [] Pain in legs at rest   [] Pain in legs when laying flat   [] Claudication   [] Pain in feet when walking  [] Pain in feet at rest  [] Pain in feet when laying flat   [] History of DVT   [] Phlebitis   [x] Swelling in legs   [] Varicose veins   [x] Non-healing ulcers Pulmonary:   [] Uses home oxygen   [] Productive cough   [] Hemoptysis   [] Wheeze  [] COPD   [] Asthma Neurologic:  [] Dizziness  [] Blackouts   [] Seizures   [] History of stroke   [] History of TIA  [] Aphasia   [] Temporary blindness   [] Dysphagia   [] Weakness or numbness in arms   [] Weakness or numbness in legs Musculoskeletal:  [x] Arthritis   [] Joint swelling   [x] Joint pain   [] Low back pain Hematologic:  [] Easy bruising  [] Easy bleeding   [] Hypercoagulable state   [] Anemic  [] Hepatitis Gastrointestinal:  [] Blood in stool   [] Vomiting blood  [x] Gastroesophageal reflux/heartburn   [] Difficulty swallowing. Genitourinary:  [x] Chronic  kidney disease   [] Difficult urination  [] Frequent urination  [] Burning with urination   [] Blood in urine Skin:  [] Rashes   [x] Ulcers   [x] Wounds Psychological:  [] History of anxiety   []  History of major depression.  Physical Examination  Vitals:   03/05/18 2024 03/06/18 0325 03/06/18 0730 03/06/18 0735  BP:  138/64  (!) 156/81  Pulse: 81 78  87  Resp: 20 18  (!)  22  Temp:  98.5 F (36.9 C)  (!) 97.4 F (36.3 C)  TempSrc:    Oral  SpO2: 98% 97% 96% 98%  Weight:  (!) 368 lb 8 oz (167.2 kg)    Height:       Body mass index is 48.62 kg/m. Gen:  Morbidly obese, NAD Head: [place]/AT, No temporalis wasting Ear/Nose/Throat: Hearing grossly intact, nares w/o erythema or drainage, oropharynx w/o Erythema/Exudate Eyes: Sclera non-icteric, conjunctiva clear Neck: Trachea midline.  No JVD.  Pulmonary:  Good air movement, respirations not labored, equal bilaterally.  Cardiac: irregularly irregular Vascular:  Vessel Right Left  Radial Palpable Palpable                          PT Not Palpable Not Palpable  DP Not Palpable Not Palpable   Gastrointestinal: Obese.  Soft, non-tender/non-distended.  Musculoskeletal: M/S 5/5 throughout.  Charcot deformity of the left foot with a draining ulceration which is currently dressed.  3+ lower extremity edema making pulses impossible to palpate.  Marked stasis dermatitis changes bilaterally a little worse on the left than the right Neurologic: Sensation diminished in lower extremities.  Symmetrical.  Speech is fluent. Motor exam as listed above. Psychiatric: Judgment intact, Mood & affect appropriate for pt's clinical situation. Dermatologic: Open wound on the left foot currently dressed      CBC Lab Results  Component Value Date   WBC 12.5 (H) 03/06/2018   HGB 11.3 (L) 03/06/2018   HCT 34.6 (L) 03/06/2018   MCV 86.8 03/06/2018   PLT 205 03/06/2018    BMET    Component Value Date/Time   NA 142 03/06/2018 0559   K 3.6 03/06/2018 0559    CL 99 03/06/2018 0559   CO2 32 03/06/2018 0559   GLUCOSE 148 (H) 03/06/2018 0559   BUN 33 (H) 03/06/2018 0559   CREATININE 1.95 (H) 03/06/2018 0559   CALCIUM 7.8 (L) 03/06/2018 0559   GFRNONAA 34 (L) 03/06/2018 0559   GFRAA 39 (L) 03/06/2018 0559   Estimated Creatinine Clearance: 59.7 mL/min (A) (by C-G formula based on SCr of 1.95 mg/dL (H)).  COAG Lab Results  Component Value Date   INR 1.42 03/05/2018    Radiology Ct Abdomen Pelvis Wo Contrast  Result Date: 03/05/2018 CLINICAL DATA:  67 year old male with fever of unknown origin, leukocytosis. Evaluate for intra-abdominal source for infection. EXAM: CT ABDOMEN AND PELVIS WITHOUT CONTRAST TECHNIQUE: Multidetector CT imaging of the abdomen and pelvis was performed following the standard protocol without IV contrast. COMPARISON:  None. FINDINGS: Lower chest: Respiratory motion artifact limits evaluation for small pulmonary nodules. Mild bibasilar subsegmental atelectasis. Incompletely imaged cardiomegaly. Calcifications of the aortic valve and coronary arteries are noted. No pericardial effusion. Unremarkable distal thoracic esophagus. Hepatobiliary: Relative hypertrophy of the left hepatic lobe and caudate lobe with blunting of the free edge of the liver suggests morphologic changes of cirrhosis. No discrete hepatic mass, gallbladder abnormality or biliary ductal dilatation. Pancreas: No pancreatic mass or inflammatory changes. There are a few punctate calcifications which may represent the sequelae of prior pancreatitis. Spleen: Normal in size without focal abnormality. Adrenals/Urinary Tract: Normal adrenal glands. No evidence of hydronephrosis or nephrolithiasis. Unremarkable ureters. Foley catheter present in the collapsed bladder. Stomach/Bowel: Normal colon, appendix and terminal ileum. The stomach and duodenum are also unremarkable. However, there are several loops of dilated and air-filled small bowel left of midline in the abdomen  without a definite transition point which are non-specific. No  significant inflammatory changes in the adjacent mesentery and no evidence of interloop fluid. Vascular/Lymphatic: Limited evaluation in the absence of intravenous contrast. No evidence of aneurysm. Calcifications present throughout the abdominal aorta. Abnormal lymphadenopathy is present in the left retroperitoneum beginning in the iliac chain. Multiple hypertrophic lymph nodes are present in the left common and external iliac nodal stations as well as in the left superficial inguinal region. Index lymph nodes as follows: Left superficial inguinal node 2.0 cm in short axis (image 109 series 2); left deep inguinal lymph node 1.5 cm in short axis (image 96 series 2); left external iliac node measures 2.7 cm in short axis (image 87 series 2); left external iliac node measures 2.2 cm in short axis (image 72 series 2); left common iliac node measures 1.5 cm in short axis (image 61 series 2). There are also mildly prominent, but not definitively enlarged nodes along the right iliac chain. No additional lymphadenopathy is identified. Reproductive: Prostate is unremarkable. Other: No abdominal wall hernia or abnormality. No abdominopelvic ascites. Musculoskeletal: No acute fracture or aggressive appearing lytic or blastic osseous lesion. Multiple subacute to remote bilateral rib fractures, incompletely imaged. Multilevel degenerative disc disease and bilateral facet arthropathy. Mild bilateral hip joint osteoarthritis. Chronic right L5 pars fracture. IMPRESSION: 1. The primary abnormality is lymphadenopathy along the left iliac and inguinal nodal stations. Differential considerations include reactive adenopathy if the patient has a significant or chronic infectious/inflammatory process involving the left lower extremity. Additionally, a lymphoproliferative process such as lymphoma, or less likely metastatic adenopathy are considerations. Lymphoma is favored in  the absence of a known chronic left lower extremity infectious process. Recommend further evaluation with PET-CT to assess for hypermetabolic activity and to identify a suitable target for tissue sampling. 2. Several loops of gaseous distension of small bowel in the left mid abdomen without significant wall thickening or transition point. Differential considerations include mild gastroenteritis and potentially early or partial small bowel obstruction. 3. Morphologic changes of early hepatic cirrhosis. 4. Aortic valve calcifications. 5. Coronary artery atherosclerotic calcifications. 6.  Aortic Atherosclerosis (ICD10-170.0). 7. Foley catheter in place in the collapsed bladder. 8. Multiple bilateral subacute to remote rib fractures. 9. Multilevel degenerative disc disease and bilateral facet arthropathy. 10. Chronic right L5 pars fracture. 11. Mild bilateral hip joint osteoarthritis. Electronically Signed   By: Malachy MoanHeath  McCullough M.D.   On: 03/05/2018 09:45   Ct Head Wo Contrast  Result Date: 03/05/2018 CLINICAL DATA:  Altered mental status EXAM: CT HEAD WITHOUT CONTRAST TECHNIQUE: Contiguous axial images were obtained from the base of the skull through the vertex without intravenous contrast. COMPARISON:  None. FINDINGS: Brain: 5 mm hyper density in the anterior superior third ventricle compatible with colloid cyst. No hydrocephalus. Negative for infarct or hemorrhage. No fluid collection or midline shift. Vascular: Negative for hyperdense vessel Skull: Negative Sinuses/Orbits: Paranasal sinuses clear. Bilateral cataract surgery. 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 No acute abnormality. Electronically Signed   By: Marlan Palauharles  Clark M.D.   On: 03/05/2018 09:26   Mr Foot Left Wo Contrast  Result Date: 03/05/2018 CLINICAL DATA:  Plantar ulceration of the left foot with severe lower extremity edema. Evaluate for osteomyelitis. EXAM: MRI OF THE LEFT FOOT WITHOUT  CONTRAST TECHNIQUE: Multiplanar, multisequence MR imaging of the left foot was performed. No intravenous contrast was administered. Osteomyelitis protocol MRI of the foot was obtained, to include the entire foot and ankle. This protocol uses a large field of view to cover  the entire foot and ankle, and is suitable for assessing bony structures for osteomyelitis. Due to the large field of view and imaging plane choice, this protocol is less sensitive for assessing small structures such as ligamentous structures of the foot and ankle, compared to a dedicated forefoot or dedicated hindfoot exam. COMPARISON:  Left foot x-rays from same day. FINDINGS: Bones/Joint/Cartilage Cortical irregularity and focal marrow edema with decreased T1 marrow signal in the plantar posterior calcaneus, consistent with osteomyelitis. No fracture or dislocation. Severe pes planus. Hindfoot valgus with degenerative marrow edema and cystic change in the lateral talus and articulating calcaneus. Small amount of marrow edema and irregularity of the distal fibula. Mild osteoarthritis of the first MTP joint. Moderate osteoarthritis of the first IP joint. No joint effusion. Ligaments Grossly intact. Muscles and Tendons Flexor, peroneal and extensor compartment tendons are intact. Severe fatty atrophy of the foot muscles. Soft tissue Large plantar hindfoot soft tissue ulceration. No fluid collection or hematoma. No soft tissue mass. Moderate skin thickening and soft tissue swelling of the foot. IMPRESSION: 1. Large plantar hindfoot soft tissue ulcer with osteomyelitis of the plantar calcaneus. No abscess. 2. Severe pes planovalgus with evidence of lateral hindfoot impingement. Electronically Signed   By: Obie Dredge M.D.   On: 03/05/2018 13:13   Dg Chest Port 1 View  Result Date: 03/05/2018 CLINICAL DATA:  Fever and hypoxia. EXAM: PORTABLE CHEST 1 VIEW COMPARISON:  None FINDINGS: There is moderate cardiac enlargement and aortic  atherosclerosis. Decreased lung volumes. No pleural effusion or edema. No airspace opacities identified. IMPRESSION: 1. No acute findings. 2. Cardiac enlargement. 3.  Aortic Atherosclerosis (ICD10-I70.0). Electronically Signed   By: Signa Kell M.D.   On: 03/05/2018 07:30   Dg Foot 2 Views Left  Result Date: 03/05/2018 CLINICAL DATA:  Ulcer of left foot. EXAM: LEFT FOOT - 2 VIEW COMPARISON:  None. FINDINGS: There is marked diffuse soft tissue swelling. The bones appear diffusely osteopenic. Large soft tissue ulcer along the plantar surface of the hindfoot and midfoot noted. There is increased sclerosis and periosteal reaction along the plantar surface of the calcaneus which may represent the sequelae of chronic osteomyelitis. IMPRESSION: 1. Large plantar soft tissue ulceration. 2. Increase sclerosis and periosteal reaction along the plantar surface of the calcaneus which may represent chronic osteomyelitis. Acute on chronic osteomyelitis not excluded. This could be better assessed with MRI with contrast material or three-phase bone scintigraphy. Electronically Signed   By: Signa Kell M.D.   On: 03/05/2018 07:58      Assessment/Plan 1.  Osteomyelitis of the left calcaneus.  He is to have debridement by podiatry tomorrow.  This is obviously a severe, critical and limb threatening situation.  This has been a chronic ulceration for years and he has significant malformation of his foot making healing very difficult.  His morbid obesity is also a major issue.  He is at very high risk for amputation.  It is impossible to feel any pedal pulses with his marked leg swelling and inflammation, but he certainly has multiple atherosclerotic risk factors.  We could certainly put him on for an angiography on Wednesday or Thursday if there is concern for his bleeding at the time of his procedure Tuesday.  Will discuss with Dr. Alberteen Spindle 2.  Morbid obesity.  Is going to make healing his wound very difficult if he has  any hope of ambulation in the future. 3.  Diabetes mellitus. blood glucose control important in reducing the progression of atherosclerotic disease. Also,  involved in wound healing. On appropriate medications. 4.  HTN. Stable on outpatient medications and blood pressure control important in reducing the progression of atherosclerotic disease. On appropriate oral medications. 5.  A. Fib.  On anticoagulation. This is held for debridement.     Festus Barren, MD  03/06/2018 8:14 AM    This note was created with Dragon medical transcription system.  Any error is purely unintentional

## 2018-03-06 NOTE — Consult Note (Signed)
[facility]  CONSULT NOTE    Date: 03/06/2018                  Patient Name:  Alan Dillon  MRN: [id number]  DOB: 11-09-1950  Age / Sex: 67 y.o., male         PCP: Tsosie Billing, MD                 Service Requesting Consult: Dr. Cherlynn Kaiser                 Reason for Consult: Acute Renal Failure            History of Present Illness: Mr. Kaydin Karbowski is a 67 y.o. white male with osteomyelitis, hypertension, diabetes mellitus type II, atrial fibrillation, history of acute kidney injury requiring hemodialysis , who was admitted to [facility] on 03/05/2018 for Ulcer of left foot (HCC) [L97.529] Cellulitis of left lower extremity [L03.116] Sepsis, due to unspecified organism [facility]) [A41.9] Osteomyelitis of left foot, unspecified type (HCC) [M86.9] Sepsis (HCC) [A41.9]  Patient is from [facility], [place] and follows with Dr. Marcelina Morel for nephrology. Patient states that his baseline creatinine is around 2.   Patient with blood and urine cultures positive for E. Coli. He is on empiric meropenem.   Patient complains nausea, vomiting, and total body aches. Tmax of 101.3  Foley catheter placed. UOP 800   Medications: Outpatient medications: Medications Prior to Admission  Medication Sig Dispense Refill Last Dose  . acetaminophen (TYLENOL) 500 MG tablet Take 500 mg by mouth every 6 (six) hours as needed for mild pain or fever.   PRN at PRN  . albuterol (PROVENTIL HFA;VENTOLIN HFA) 108 (90 Base) MCG/ACT inhaler Inhale 2 puffs into the lungs 2 (two) times daily.   03/04/2018 at Unknown time  . ammonium lactate (LAC-HYDRIN) 12 % lotion Apply 1 application topically as needed for dry skin (on legs).   03/04/2018 at Unknown time  . apixaban (ELIQUIS) 5 MG TABS tablet Take 5 mg by mouth 2 (two) times daily.   03/04/2018 at 1300  . atorvastatin (LIPITOR) 10 MG tablet Take 10 mg by mouth every evening.   03/04/2018 at 1300  . brimonidine (ALPHAGAN) 0.2 % ophthalmic solution Place 1  drop into both eyes 2 (two) times daily.   03/04/2018 at 1300  . bumetanide (BUMEX) 2 MG tablet Take 2 mg by mouth 2 (two) times daily.   03/04/2018 at 1300  . carvedilol (COREG) 12.5 MG tablet Take 12.5 mg by mouth 2 (two) times daily.   03/04/2018 at 1300  . cetirizine (ZYRTEC) 10 MG tablet Take 10 mg by mouth daily.   03/04/2018 at 0100  . Cholecalciferol (D3-1000) 1000 units capsule Take 1,000 Units by mouth daily.   03/04/2018 at 1300  . clotrimazole-betamethasone (LOTRISONE) cream Apply 1 application topically 2 (two) times daily as needed (to skin folds).   03/04/2018 at Unknown time  . famotidine (PEPCID) 20 MG tablet Take 20 mg by mouth every evening.    03/04/2018 at 1300  . ferrous sulfate 325 (65 FE) MG tablet Take 325 mg by mouth 2 (two) times daily.   03/04/2018 at 1300  . gabapentin (NEURONTIN) 300 MG capsule Take 300 mg by mouth 2 (two) times daily.   03/04/2018 at 1300  . hydrALAZINE (APRESOLINE) 25 MG tablet Take 25 mg by mouth 3 (three) times daily.   03/04/2018 at 1300  . insulin aspart (NOVOLOG FLEXPEN) 100 UNIT/ML FlexPen Inject 36 Units  into the skin 3 (three) times daily before meals. According to sliding scale if sugars above 150.   03/04/2018 at 1800  . Insulin Degludec (TRESIBA FLEXTOUCH) 200 UNIT/ML SOPN Inject 56 Units into the skin 2 (two) times daily.   03/04/2018 at 2000  . metFORMIN (GLUCOPHAGE-XR) 500 MG 24 hr tablet Take 500 mg by mouth 2 (two) times daily.   03/04/2018 at 1800  . Multiple Vitamins-Minerals (MULTIVITAMIN WITH MINERALS) tablet Take 1 tablet by mouth daily.   03/04/2018 at 0100  . Probiotic Product (PROBIOTIC PO) Take 1 tablet by mouth daily.   03/04/2018 at 0100    Current medications: Current Facility-Administered Medications  Medication Dose Route Frequency Provider Last Rate Last Dose  . acetaminophen (TYLENOL) tablet 650 mg  650 mg Oral Q6H PRN Katha Hamming, MD       Or  . acetaminophen (TYLENOL) suppository 650 mg  650 mg Rectal Q6H PRN Katha Hamming, MD      . albuterol (PROVENTIL) (2.5 MG/3ML) 0.083% nebulizer solution 3 mL  3 mL Inhalation BID Katha Hamming, MD   3 mL at 03/06/18 0729  . atorvastatin (LIPITOR) tablet 10 mg  10 mg Oral QPM Katha Hamming, MD   10 mg at 03/05/18 1728  . bisacodyl (DULCOLAX) EC tablet 5 mg  5 mg Oral Daily PRN Katha Hamming, MD      . brimonidine (ALPHAGAN) 0.2 % ophthalmic solution 1 drop  1 drop Both Eyes BID Katha Hamming, MD   1 drop at 03/06/18 1001  . bumetanide (BUMEX) tablet 2 mg  2 mg Oral BID Katha Hamming, MD   2 mg at 03/06/18 1001  . carvedilol (COREG) tablet 12.5 mg  12.5 mg Oral BID Katha Hamming, MD   12.5 mg at 03/06/18 0940  . docusate sodium (COLACE) capsule 100 mg  100 mg Oral BID Katha Hamming, MD   100 mg at 03/06/18 0940  . ferrous sulfate tablet 325 mg  325 mg Oral BID Katha Hamming, MD   325 mg at 03/06/18 0940  . heparin ADULT infusion 100 units/mL (25000 units/273mL sodium chloride 0.45%)  2,000 Units/hr Intravenous Continuous Olene Floss, RPH 20 mL/hr at 03/06/18 0941 2,000 Units/hr at 03/06/18 0941  . HYDROcodone-acetaminophen (NORCO/VICODIN) 5-325 MG per tablet 1-2 tablet  1-2 tablet Oral Q4H PRN Katha Hamming, MD   2 tablet at 03/05/18 2316  . insulin aspart (novoLOG) injection 0-15 Units  0-15 Units Subcutaneous TID WC Katha Hamming, MD   2 Units at 03/06/18 0940  . insulin aspart (novoLOG) injection 3 Units  3 Units Subcutaneous TID WC Katha Hamming, MD   3 Units at 03/05/18 1729  . meropenem (MERREM) 1 g in sodium chloride 0.9 % 100 mL IVPB  1 g Intravenous Q8H Hallaji, Sheema M, RPH   Stopped at 03/06/18 0600  . multivitamin with minerals tablet 1 tablet  1 tablet Oral Daily Katha Hamming, MD   1 tablet at 03/06/18 0940  . ondansetron (ZOFRAN) tablet 4 mg  4 mg Oral Q6H PRN Katha Hamming, MD       Or  . ondansetron (ZOFRAN) injection 4 mg  4 mg Intravenous Q6H PRN  Katha Hamming, MD   4 mg at 03/06/18 0502  . phenol (CHLORASEPTIC) mouth spray 1 spray  1 spray Mouth/Throat PRN Houston Siren, MD      . promethazine (PHENERGAN) injection 12.5 mg  12.5 mg Intravenous Q6H PRN Arnaldo Natal, MD   12.5 mg at 03/06/18 0616  .  traZODone (DESYREL) tablet 25 mg  25 mg Oral QHS PRN Katha Hamming, MD          Allergies: Allergies  Allergen Reactions  . Bactrim [Sulfamethoxazole-Trimethoprim] Other (See Comments)    Caused kidney failure      Past Medical History: Past Medical History:  Diagnosis Date  . Atrial fibrillation, chronic (HCC)    On apixaban  . CKD (chronic kidney disease), stage III (HCC)   . Diabetes mellitus type 2 in obese (HCC)   . 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 History of hemodialysis    Bactrim mediated  Acute renal failure  . Hypertension   . Morbid obesity with BMI of 45.0-49.9, adult (HCC)   . Osteomyelitis of ankle or foot, acute, left (HCC)    chronic     Past Surgical History: Past Surgical History:  Procedure Laterality Date  . DEBRIDEMENT  FOOT Left      Family History: Family History  Problem Relation Age of Onset  . Stroke Mother   . Diabetes Father      Social History: Social History   Socioeconomic History  . Marital status: Married    Spouse name: Not on file  . Number of children: Not on file  . Years of education: Not on file  . Highest education level: Not on file  Occupational History  . Not on file  Social Needs  . Financial resource strain: Not on file  . Food insecurity:    Worry: Not on file    Inability: Not on file  . Transportation needs:    Medical: Not on file    Non-medical: Not on file  Tobacco Use  . Smoking status: Never Smoker  . Smokeless tobacco: Never Used  Substance and Sexual Activity  . Alcohol use: Not on file  . Drug use: Not on file  . Sexual activity: Not on file  Lifestyle   . Physical activity:    Days per week: Not on file    Minutes per session: Not on file  . Stress: Not on file  Relationships  . Social connections:    Talks on phone: Not on file    Gets together: Not on file    Attends religious service: Not on file    Active member of club or organization: Not on file    Attends meetings of clubs or organizations: Not on file    Relationship status: Not on file  . Intimate partner violence:    Fear of current or ex partner: Not on file    Emotionally abused: Not on file    Physically abused: Not on file    Forced sexual activity: Not on file  Other Topics Concern  . Not on file  Social History Narrative  . Not on file     Review of Systems: Review of Systems  Constitutional: Positive for chills, fever and malaise/fatigue. Negative for diaphoresis and weight loss.  HENT: Negative.  Negative for congestion, ear discharge, ear pain, hearing loss, nosebleeds, sinus pain, sore throat and tinnitus.   Eyes: Negative.  Negative for blurred vision, double vision, photophobia, pain, discharge and redness.  Respiratory: Positive for cough and wheezing. Negative for hemoptysis, sputum production, shortness of breath and stridor.   Cardiovascular: Positive for leg swelling. Negative for chest pain, palpitations, orthopnea, claudication and PND.  Gastrointestinal: Positive for diarrhea, nausea and vomiting. Negative for abdominal pain, blood  in stool, constipation, heartburn and melena.  Genitourinary: Negative.  Negative for dysuria, flank pain, frequency, hematuria and urgency.  Musculoskeletal: Negative.  Negative for back pain, falls, joint pain, myalgias and neck pain.  Skin: Negative.  Negative for itching and rash.  Neurological: Negative.  Negative for dizziness, tingling, tremors, sensory change, speech change, focal weakness, seizures, loss of consciousness, weakness and headaches.  Endo/Heme/Allergies: Negative.  Negative for environmental  allergies and polydipsia. Does not bruise/bleed easily.  Psychiatric/Behavioral: Negative.  Negative for depression, hallucinations, memory loss, substance abuse and suicidal ideas. The patient is not nervous/anxious and does not have insomnia.     Vital Signs: Blood pressure (!) 156/81, pulse 87, temperature (!) 97.4 F (36.3 C), temperature source Oral, resp. rate (!) 22, height 6\' 1"  (1.854 m), weight (!) 167.2 kg (368 lb 8 oz), SpO2 98 %.  Weight trends: Filed Weights   03/05/18 1610 03/05/18 1325 03/06/18 0325  Weight: (!) 169.7 kg (374 lb 1.6 oz) (!) 166.5 kg (367 lb) (!) 167.2 kg (368 lb 8 oz)    Physical Exam: General: NAD  Head: Normocephalic, atraumatic. Moist oral mucosal membranes  Eyes: Anicteric, PERRL  Neck: Supple, trachea midline  Lungs:  Clear to auscultation  Heart: Regular rate and rhythm  Abdomen:  Soft, nontender, obese  Extremities: No peripheral edema.  Neurologic: Nonfocal, moving all four extremities  Skin: No lesions        Lab results: Basic Metabolic Panel: Recent Labs  Lab 03/05/18 0646 03/06/18 0559  NA 141 142  K 3.9 3.6  CL 101 99  CO2 28 32  GLUCOSE 173* 148*  BUN 29* 33*  CREATININE 1.86* 1.95*  CALCIUM 8.4* 7.8*    Liver Function Tests: Recent Labs  Lab 03/05/18 0646  AST 33  ALT 18  ALKPHOS 81  BILITOT 0.7  PROT 8.3*  ALBUMIN 3.4*   Recent Labs  Lab 03/05/18 0646  LIPASE 28   No results for input(s): AMMONIA in the last 168 hours.  CBC: Recent Labs  Lab 03/05/18 0646 03/06/18 0559  WBC 21.3* 12.5*  NEUTROABS 19.9*  --   HGB 12.6* 11.3*  HCT 38.9* 34.6*  MCV 87.0 86.8  PLT 272 205    Cardiac Enzymes: Recent Labs  Lab 03/05/18 0646  TROPONINI 0.03*    BNP: Invalid input(s): POCBNP  CBG: Recent Labs  Lab 03/05/18 1638 03/05/18 2134 03/06/18 0734  GLUCAP 125* 166* 147*    Microbiology: Results for orders placed or performed during the hospital encounter of 03/05/18  Blood Culture (routine  x 2)     Status: None (Preliminary result)   Collection Time: 03/05/18  6:47 AM  Result Value Ref Range Status   Specimen Description BLOOD RIGHT FOREARM  Final   Special Requests   Final    BOTTLES DRAWN AEROBIC AND ANAEROBIC Blood Culture adequate volume   Culture  Setup Time   Final    GRAM NEGATIVE RODS IN BOTH AEROBIC AND ANAEROBIC BOTTLES CRITICAL RESULT CALLED TO, READ BACK BY AND VERIFIED WITH: SHEEMA HALLAJI @ 1611 ON 03/05/2018 BY CAF Performed at [facility], [address] [place], [place] [address]    Culture GRAM NEGATIVE RODS  Final   Report Status PENDING  Incomplete  Blood Culture (routine x 2)     Status: Abnormal (Preliminary result)   Collection Time: 03/05/18  6:49 AM  Result Value Ref Range Status   Specimen Description   Final    BLOOD RIGHT HAND Performed at  [facility], [address], [place], [place] [address]    Special Requests   Final    BOTTLES DRAWN AEROBIC AND ANAEROBIC Blood Culture results may not be optimal due to an excessive volume of blood received in culture bottles Performed at [facility], [address] [place], [place] [address]    Culture  Setup Time   Final    IN BOTH AEROBIC AND ANAEROBIC BOTTLES GRAM NEGATIVE RODS CRITICAL RESULT CALLED TO, READ BACK BY AND VERIFIED WITH: SHEEMA HALLAJI @ 1611 ON 03/05/2018 BY CAF    Culture (A)  Final    ESCHERICHIA COLI SUSCEPTIBILITIES TO FOLLOW Performed at [facility], [address] [place], [place] [address]    Report Status PENDING  Incomplete  Blood Culture ID Panel (Reflexed)     Status: Abnormal   Collection Time: 03/05/18  6:49 AM  Result Value Ref Range Status   Enterococcus species NOT DETECTED NOT DETECTED Final   Listeria monocytogenes NOT DETECTED NOT DETECTED Final   Staphylococcus species NOT DETECTED NOT DETECTED Final   Staphylococcus aureus NOT DETECTED NOT DETECTED Final   Streptococcus species NOT DETECTED NOT DETECTED Final    Streptococcus agalactiae NOT DETECTED NOT DETECTED Final   Streptococcus pneumoniae NOT DETECTED NOT DETECTED Final   Streptococcus pyogenes NOT DETECTED NOT DETECTED Final   Acinetobacter baumannii NOT DETECTED NOT DETECTED Final   Enterobacteriaceae species DETECTED (A) NOT DETECTED Final    Comment: Enterobacteriaceae represent a large family of gram-negative bacteria, not a single organism. CRITICAL RESULT CALLED TO, READ BACK BY AND VERIFIED WITH: SHEEMA HALLAJI @ 1611 ON 03/05/2018 BY CAF    Enterobacter cloacae complex NOT DETECTED NOT DETECTED Final   Escherichia coli DETECTED (A) NOT DETECTED Final    Comment: CRITICAL RESULT CALLED TO, READ BACK BY AND VERIFIED WITH: SHEEMA HALLAJI @ 1611 ON 03/05/2018 BY CAF    Klebsiella oxytoca NOT DETECTED NOT DETECTED Final   Klebsiella pneumoniae NOT DETECTED NOT DETECTED Final   Proteus species NOT DETECTED NOT DETECTED Final   Serratia marcescens NOT DETECTED NOT DETECTED Final   Carbapenem resistance NOT DETECTED NOT DETECTED Final   Haemophilus influenzae NOT DETECTED NOT DETECTED Final   Neisseria meningitidis NOT DETECTED NOT DETECTED Final   Pseudomonas aeruginosa NOT DETECTED NOT DETECTED Final   Candida albicans NOT DETECTED NOT DETECTED Final   Candida glabrata NOT DETECTED NOT DETECTED Final   Candida krusei NOT DETECTED NOT DETECTED Final   Candida parapsilosis NOT DETECTED NOT DETECTED Final   Candida tropicalis NOT DETECTED NOT DETECTED Final    Comment: Performed at [facility], [address] [place], [place] [address]  Urine culture     Status: None   Collection Time: 03/05/18  7:19 AM  Result Value Ref Range Status   Specimen Description   Final    URINE, RANDOM Performed at [facility], [address] [place], [place] [address]    Special Requests   Final    NONE Performed at [facility], [address], [place], [place] [address]    Culture   Final    NO GROWTH Performed at  [facility], [address], [place], [place] [address]    Report Status 03/06/2018 FINAL  Final    Coagulation Studies: Recent Labs    03/05/18 0646  LABPROT 17.2*  INR 1.42    Urinalysis: Recent Labs    03/05/18 0719  COLORURINE YELLOW*  LABSPEC 1.014  PHURINE 5.0  GLUCOSEU NEGATIVE  HGBUR MODERATE*  BILIRUBINUR NEGATIVE  KETONESUR NEGATIVE  PROTEINUR 100*  NITRITE NEGATIVE  LEUKOCYTESUR NEGATIVE      Imaging: Ct Abdomen Pelvis Wo Contrast  Result Date: 03/05/2018 CLINICAL DATA:  67 year old male with fever of unknown origin, leukocytosis. Evaluate for intra-abdominal source for infection. EXAM: CT ABDOMEN AND PELVIS WITHOUT CONTRAST TECHNIQUE: Multidetector CT imaging of the abdomen and pelvis was performed following the standard protocol without IV contrast. COMPARISON:  None. FINDINGS: Lower chest: Respiratory motion artifact limits evaluation for small pulmonary nodules. Mild bibasilar subsegmental atelectasis. Incompletely imaged cardiomegaly. Calcifications of the aortic valve and coronary arteries are noted. No pericardial effusion. Unremarkable distal thoracic esophagus. Hepatobiliary: Relative hypertrophy of the left hepatic lobe and caudate lobe with blunting of the free edge of the liver suggests morphologic changes of cirrhosis. No discrete hepatic mass, gallbladder abnormality or biliary ductal dilatation. Pancreas: No pancreatic mass or inflammatory changes. There are a few punctate calcifications which may represent the sequelae of prior pancreatitis. Spleen: Normal in size without focal abnormality. Adrenals/Urinary Tract: Normal adrenal glands. No evidence of hydronephrosis or nephrolithiasis. Unremarkable ureters. Foley catheter present in the collapsed bladder. Stomach/Bowel: Normal colon, appendix and terminal ileum. The stomach and duodenum are also unremarkable. However, there are several loops of dilated and air-filled small bowel left of midline in  the abdomen without a definite transition point which are non-specific. No significant inflammatory changes in the adjacent mesentery and no evidence of interloop fluid. Vascular/Lymphatic: Limited evaluation in the absence of intravenous contrast. No evidence of aneurysm. Calcifications present throughout the abdominal aorta. Abnormal lymphadenopathy is present in the left retroperitoneum beginning in the iliac chain. Multiple hypertrophic lymph nodes are present in the left common and external iliac nodal stations as well as in the left superficial inguinal region. Index lymph nodes as follows: Left superficial inguinal node 2.0 cm in short axis (image 109 series 2); left deep inguinal lymph node 1.5 cm in short axis (image 96 series 2); left external iliac node measures 2.7 cm in short axis (image 87 series 2); left external iliac node measures 2.2 cm in short axis (image 72 series 2); left common iliac node measures 1.5 cm in short axis (image 61 series 2). There are also mildly prominent, but not definitively enlarged nodes along the right iliac chain. No additional lymphadenopathy is identified. Reproductive: Prostate is unremarkable. Other: No abdominal wall hernia or abnormality. No abdominopelvic ascites. Musculoskeletal: No acute fracture or aggressive appearing lytic or blastic osseous lesion. Multiple subacute to remote bilateral rib fractures, incompletely imaged. Multilevel degenerative disc disease and bilateral facet arthropathy. Mild bilateral hip joint osteoarthritis. Chronic right L5 pars fracture. IMPRESSION: 1. The primary abnormality is lymphadenopathy along the left iliac and inguinal nodal stations. Differential considerations include reactive adenopathy if the patient has a significant or chronic infectious/inflammatory process involving the left lower extremity. Additionally, a lymphoproliferative process such as lymphoma, or less likely metastatic adenopathy are considerations. Lymphoma is  favored in the absence of a known chronic left lower extremity infectious process. Recommend further evaluation with PET-CT to assess for hypermetabolic activity and to identify a suitable target for tissue sampling. 2. Several loops of gaseous distension of small bowel in the left mid abdomen without significant wall thickening or transition point. Differential considerations include mild gastroenteritis and potentially early or partial small bowel obstruction. 3. Morphologic changes of early hepatic cirrhosis. 4. Aortic valve calcifications. 5. Coronary artery atherosclerotic calcifications. 6.  Aortic Atherosclerosis (ICD10-170.0). 7. Foley  catheter in place in the collapsed bladder. 8. Multiple bilateral subacute to remote rib fractures. 9. Multilevel degenerative disc disease and bilateral facet arthropathy. 10. Chronic right L5 pars fracture. 11. Mild bilateral hip joint osteoarthritis. Electronically Signed   By: Malachy Moan M.D.   On: 03/05/2018 09:45   Ct Head Wo Contrast  Result Date: 03/05/2018 CLINICAL DATA:  Altered mental status EXAM: CT HEAD WITHOUT CONTRAST TECHNIQUE: Contiguous axial images were obtained from the base of the skull through the vertex without intravenous contrast. COMPARISON:  None. FINDINGS: Brain: 5 mm hyper density in the anterior superior third ventricle compatible with colloid cyst. No hydrocephalus. Negative for infarct or hemorrhage. No fluid collection or midline shift. Vascular: Negative for hyperdense vessel Skull: Negative Sinuses/Orbits: Paranasal sinuses clear. Bilateral cataract surgery. 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 No acute abnormality. Electronically Signed   By: Marlan Palau M.D.   On: 03/05/2018 09:26   Mr Foot Left Wo Contrast  Result Date: 03/05/2018 CLINICAL DATA:  Plantar ulceration of the left foot with severe lower extremity edema. Evaluate for osteomyelitis. EXAM: MRI OF THE LEFT FOOT  WITHOUT CONTRAST TECHNIQUE: Multiplanar, multisequence MR imaging of the left foot was performed. No intravenous contrast was administered. Osteomyelitis protocol MRI of the foot was obtained, to include the entire foot and ankle. This protocol uses a large field of view to cover the entire foot and ankle, and is suitable for assessing bony structures for osteomyelitis. Due to the large field of view and imaging plane choice, this protocol is less sensitive for assessing small structures such as ligamentous structures of the foot and ankle, compared to a dedicated forefoot or dedicated hindfoot exam. COMPARISON:  Left foot x-rays from same day. FINDINGS: Bones/Joint/Cartilage Cortical irregularity and focal marrow edema with decreased T1 marrow signal in the plantar posterior calcaneus, consistent with osteomyelitis. No fracture or dislocation. Severe pes planus. Hindfoot valgus with degenerative marrow edema and cystic change in the lateral talus and articulating calcaneus. Small amount of marrow edema and irregularity of the distal fibula. Mild osteoarthritis of the first MTP joint. Moderate osteoarthritis of the first IP joint. No joint effusion. Ligaments Grossly intact. Muscles and Tendons Flexor, peroneal and extensor compartment tendons are intact. Severe fatty atrophy of the foot muscles. Soft tissue Large plantar hindfoot soft tissue ulceration. No fluid collection or hematoma. No soft tissue mass. Moderate skin thickening and soft tissue swelling of the foot. IMPRESSION: 1. Large plantar hindfoot soft tissue ulcer with osteomyelitis of the plantar calcaneus. No abscess. 2. Severe pes planovalgus with evidence of lateral hindfoot impingement. Electronically Signed   By: Obie Dredge M.D.   On: 03/05/2018 13:13   Dg Chest Port 1 View  Result Date: 03/05/2018 CLINICAL DATA:  Fever and hypoxia. EXAM: PORTABLE CHEST 1 VIEW COMPARISON:  None FINDINGS: There is moderate cardiac enlargement and aortic  atherosclerosis. Decreased lung volumes. No pleural effusion or edema. No airspace opacities identified. IMPRESSION: 1. No acute findings. 2. Cardiac enlargement. 3.  Aortic Atherosclerosis (ICD10-I70.0). Electronically Signed   By: Signa Kell M.D.   On: 03/05/2018 07:30   Dg Foot 2 Views Left  Result Date: 03/05/2018 CLINICAL DATA:  Ulcer of left foot. EXAM: LEFT FOOT - 2 VIEW COMPARISON:  None. FINDINGS: There is marked diffuse soft tissue swelling. The bones appear diffusely osteopenic. Large soft tissue ulcer along the plantar surface of the hindfoot and midfoot noted. There is increased sclerosis and periosteal reaction along the  plantar surface of the calcaneus which may represent the sequelae of chronic osteomyelitis. IMPRESSION: 1. Large plantar soft tissue ulceration. 2. Increase sclerosis and periosteal reaction along the plantar surface of the calcaneus which may represent chronic osteomyelitis. Acute on chronic osteomyelitis not excluded. This could be better assessed with MRI with contrast material or three-phase bone scintigraphy. Electronically Signed   By: Signa Kell M.D.   On: 03/05/2018 07:58      Assessment & Plan: Mr. Hillman Attig is a 67 y.o. white male with osteomyelitis, hypertension, diabetes mellitus type II, atrial fibrillation, history of acute kidney injury requiring hemodialysis, obstructive sleep apnea , who was admitted to [facility] on 03/05/2018 for Ulcer of left foot (HCC) [L97.529] Cellulitis of left lower extremity [L03.116] Sepsis, due to unspecified organism [facility]) [A41.9] Osteomyelitis of left foot, unspecified type (HCC) [M86.9] Sepsis (HCC) [A41.9]  1. Acute renal failure on chronic kidney disease stage III with proteinuria: History of acute renal failure requiring hemodialysis. Reports baseline creatinine around 2.  Chronic kidney disease secondary diabetes, hypertension and acute injury with limited recovery.  Acute kidney failure secondary to sepsis,  bacteremia, E. Coli UTI - Not currently on IV fluids, however unable to take PO due to nausea/vomiting. - No indication for IV fluids  - Check renal ultrasound - Holding bumetanide.   2. Hypertension: elevated this morning. Home regimen of bumetanide, carvedilol, and hydralazine.   3. Diabetes mellitus type II with chronic kidney disease: insulin dependent.  Complication of left lower extremity ulcer - hold metformin - Check hemoglobin A1c - Appreciate podiatry and vascular input. Debridement scheduled for Tuesday 7/16.   LOS: 1 Cipriano Millikan 7/15/201910:05 AM

## 2018-03-06 NOTE — H&P (View-Only) (Signed)
Patient ID: Alan Dillon, male   DOB: 1950/11/09, 67 y.o.   MRN: [id number] Subjective: Patient seen.  Unresponsive.  Objective: Foot bandaged with just some mild drainage through the bandage.  Assessment: Osteomyelitis left calcaneus.  Plan: I had discussed with the patient yesterday debriding the infected bone on the left heel tomorrow.  We did discuss possible risks and complications including inability to heal due to his diabetes or infection as well as significant risk for loss of his leg.  I did discuss his potential surgery tomorrow with Dr. Cherlynn KaiserSainani who with this point thinks that we may still be able to proceed.  We will have the patient n.p.o. after midnight and place an order for consent for debridement infected soft tissue and bone left foot for tomorrow, with the understanding that we may have to postpone pending clearance from medicine and anesthesia.

## 2018-03-07 ENCOUNTER — Encounter
Admission: EM | Disposition: A | Discharge status: Skilled Nursing Facility | Payer: Self-pay | Source: Home / Self Care | Attending: Internal Medicine

## 2018-03-07 ENCOUNTER — Encounter: Payer: Self-pay | Admitting: Certified Registered Nurse Anesthetist

## 2018-03-07 ENCOUNTER — Inpatient Hospital Stay: Payer: Medicare Other

## 2018-03-07 ENCOUNTER — Inpatient Hospital Stay (HOSPITAL_COMMUNITY)
Admit: 2018-03-07 | Discharge: 2018-03-07 | Disposition: A | Discharge status: Home / Self Care | Payer: Medicare Other | Attending: Pulmonary Disease | Admitting: Pulmonary Disease

## 2018-03-07 DIAGNOSIS — I429 Cardiomyopathy, unspecified: Secondary | ICD-10-CM

## 2018-03-07 DIAGNOSIS — L97529 Non-pressure chronic ulcer of other part of left foot with unspecified severity: Secondary | ICD-10-CM

## 2018-03-07 LAB — BLOOD GAS, ARTERIAL
Acid-Base Excess: 16.4 mmol/L — ABNORMAL HIGH (ref 0.0–2.0)
Acid-Base Excess: 19.3 mmol/L — ABNORMAL HIGH (ref 0.0–2.0)
BICARBONATE: 43.5 mmol/L — AB (ref 20.0–28.0)
Bicarbonate: 46.9 mmol/L — ABNORMAL HIGH (ref 20.0–28.0)
Delivery systems: POSITIVE
EXPIRATORY PAP: 5
FIO2: 0.28
FIO2: 0.28
Inspiratory PAP: 18
O2 SAT: 89.8 %
O2 SAT: 94.2 %
PATIENT TEMPERATURE: 37
PCO2 ART: 64 mmHg — AB (ref 32.0–48.0)
PH ART: 7.44 (ref 7.350–7.450)
PO2 ART: 56 mmHg — AB (ref 83.0–108.0)
Patient temperature: 37
pCO2 arterial: 69 mmHg (ref 32.0–48.0)
pH, Arterial: 7.44 (ref 7.350–7.450)
pO2, Arterial: 69 mmHg — ABNORMAL LOW (ref 83.0–108.0)

## 2018-03-07 LAB — CULTURE, BLOOD (ROUTINE X 2): SPECIAL REQUESTS: ADEQUATE

## 2018-03-07 LAB — RENAL FUNCTION PANEL
ALBUMIN: 2.7 g/dL — AB (ref 3.5–5.0)
ANION GAP: 9 (ref 5–15)
BUN: 31 mg/dL — ABNORMAL HIGH (ref 8–23)
CALCIUM: 8.1 mg/dL — AB (ref 8.9–10.3)
CO2: 40 mmol/L — ABNORMAL HIGH (ref 22–32)
Chloride: 99 mmol/L (ref 98–111)
Creatinine, Ser: 1.68 mg/dL — ABNORMAL HIGH (ref 0.61–1.24)
GFR calc non Af Amer: 40 mL/min — ABNORMAL LOW (ref >60)
GFR, EST AFRICAN AMERICAN: 47 mL/min — AB (ref >60)
Glucose, Bld: 112 mg/dL — ABNORMAL HIGH (ref 70–99)
PHOSPHORUS: 3 mg/dL (ref 2.5–4.6)
POTASSIUM: 3.5 mmol/L (ref 3.5–5.1)
SODIUM: 148 mmol/L — AB (ref 135–145)

## 2018-03-07 LAB — GLUCOSE, CAPILLARY
GLUCOSE-CAPILLARY: 103 mg/dL — AB (ref 70–99)
Glucose-Capillary: 96 mg/dL (ref 70–99)
Glucose-Capillary: 109 mg/dL — ABNORMAL HIGH (ref 70–99)
Glucose-Capillary: 112 mg/dL — ABNORMAL HIGH (ref 70–99)
Glucose-Capillary: 98 mg/dL (ref 70–99)

## 2018-03-07 LAB — HEPARIN LEVEL (UNFRACTIONATED): Heparin Unfractionated: 1.16 IU/mL — ABNORMAL HIGH (ref 0.30–0.70)

## 2018-03-07 LAB — AMMONIA: Ammonia: <9 umol/L — ABNORMAL LOW (ref 9–35)

## 2018-03-07 LAB — HIV ANTIBODY (ROUTINE TESTING W REFLEX): HIV Screen 4th Generation wRfx: NONREACTIVE

## 2018-03-07 LAB — LACTIC ACID, PLASMA: Lactic Acid, Venous: 1.2 mmol/L (ref 0.5–1.9)

## 2018-03-07 LAB — ECHOCARDIOGRAM COMPLETE
Height: 73 in
Weight: 5827.2 oz

## 2018-03-07 LAB — APTT
APTT: 70 s — AB (ref 24–36)
APTT: 48 s — AB (ref 24–36)
aPTT: 55 seconds — ABNORMAL HIGH (ref 24–36)

## 2018-03-07 LAB — BRAIN NATRIURETIC PEPTIDE: B Natriuretic Peptide: 105 pg/mL — ABNORMAL HIGH (ref 0.0–100.0)

## 2018-03-07 SURGERY — IRRIGATION AND DEBRIDEMENT FOOT
Anesthesia: Choice | Laterality: Left

## 2018-03-07 MED ORDER — CEFAZOLIN SODIUM-DEXTROSE 2-4 GM/100ML-% IV SOLN
2.0000 g | Freq: Three times a day (TID) | INTRAVENOUS | Status: DC
  Administered 2018-03-07 – 2018-03-14 (×22): 2 g via INTRAVENOUS
  Filled 2018-03-07 (×26): qty 100

## 2018-03-07 NOTE — Consult Note (Addendum)
SURGICAL CONSULTATION NOTE   HISTORY OF PRESENT ILLNESS (HPI):  67 y.o. male presented to [facility] ED for evaluation of altered mental status. Upon evaluation patient was awake but difficult to give a good history most likely because still disoriented and on BiPAP. Most to the history taken from ICU physician. Patient was found with altered mental status and fever. Upon arrival to ED he was confused and lethargic. As per wife the patient has had chronic left foot osteomyelitis. Workup at the ED was done with head CT scan which was negative for acute abnormality. Abdominal CT scan was done looking for sepsis source other than osteomyelitis and was negative for infectious findings. I reviewed the images and agree with radiologist that there is a loop of bowel more distended that the rest. The CT scan was done without IV or oral contrast. Food xray show acute over chronic osteomyelitis.  Patient was admitted for the treatment of sepsis secondary to the foot osteomyelitis. Yesterday patietn again became lethargic and was transferred to ICU. Patient with CO2 retention and started on BiPAP. As per ICU physician, patient with abdominal distention before BiPAP. Patient also with nausea and vomiting. Patient denies previous history of abdominal surgery. ICU physician refers that after placing NGT patient referred some relief. Patient has drained 2425 mL from the NGT, which is significant.   Surgery is consulted by Dr. Sherryll Burger in this context for evaluation and management of suspected small bowel obstruction.   PAST MEDICAL HISTORY (PMH):  Past Medical History:  Diagnosis Date  . Atrial fibrillation, chronic (HCC)    On apixaban  . CKD (chronic kidney disease), stage III (HCC)   . Diabetes mellitus type 2 in obese (HCC)   . 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 History of hemodialysis    Bactrim mediated  Acute renal failure  . Hypertension   . Morbid  obesity with BMI of 45.0-49.9, adult (HCC)   . Osteomyelitis of ankle or foot, acute, left (HCC)    chronic     PAST SURGICAL HISTORY (PSH):  Past Surgical History:  Procedure Laterality Date  . DEBRIDEMENT  FOOT Left      MEDICATIONS:  Prior to Admission medications   Medication Sig Start Date End Date Taking? Authorizing Provider  acetaminophen (TYLENOL) 500 MG tablet Take 500 mg by mouth every 6 (six) hours as needed for mild pain or fever.   Yes [provider]  albuterol (PROVENTIL HFA;VENTOLIN HFA) 108 (90 Base) MCG/ACT inhaler Inhale 2 puffs into the lungs 2 (two) times daily.   Yes [provider]  ammonium lactate (LAC-HYDRIN) 12 % lotion Apply 1 application topically as needed for dry skin (on legs).   Yes [provider]  apixaban (ELIQUIS) 5 MG TABS tablet Take 5 mg by mouth 2 (two) times daily.   Yes [provider]  atorvastatin (LIPITOR) 10 MG tablet Take 10 mg by mouth every evening.   Yes [provider]  brimonidine (ALPHAGAN) 0.2 % ophthalmic solution Place 1 drop into both eyes 2 (two) times daily.   Yes [provider]  bumetanide (BUMEX) 2 MG tablet Take 2 mg by mouth 2 (two) times daily.   Yes [provider]  carvedilol (COREG) 12.5 MG tablet Take 12.5 mg by mouth 2 (two) times daily.   Yes [provider]  cetirizine (ZYRTEC) 10 MG tablet Take 10 mg by mouth daily.   Yes  [provider]  Cholecalciferol (D3-1000) 1000 units capsule Take 1,000 Units by mouth daily.   Yes [provider]  clotrimazole-betamethasone (LOTRISONE) cream Apply 1 application topically 2 (two) times daily as needed (to skin folds).   Yes [provider]  famotidine (PEPCID) 20 MG tablet Take 20 mg by mouth every evening.    Yes [provider]  ferrous sulfate 325 (65 FE) MG tablet Take 325 mg by mouth 2 (two) times daily.   Yes [provider]  gabapentin (NEURONTIN) 300 MG  capsule Take 300 mg by mouth 2 (two) times daily.   Yes [provider]  hydrALAZINE (APRESOLINE) 25 MG tablet Take 25 mg by mouth 3 (three) times daily.   Yes [provider]  insulin aspart (NOVOLOG FLEXPEN) 100 UNIT/ML FlexPen Inject 36 Units into the skin 3 (three) times daily before meals. According to sliding scale if sugars above 150.   Yes [provider]  Insulin Degludec (TRESIBA FLEXTOUCH) 200 UNIT/ML SOPN Inject 56 Units into the skin 2 (two) times daily.   Yes [provider]  metFORMIN (GLUCOPHAGE-XR) 500 MG 24 hr tablet Take 500 mg by mouth 2 (two) times daily.   Yes [provider]  Multiple Vitamins-Minerals (MULTIVITAMIN WITH MINERALS) tablet Take 1 tablet by mouth daily.   Yes [provider]  Probiotic Product (PROBIOTIC PO) Take 1 tablet by mouth daily.   Yes [provider]     ALLERGIES:  Allergies  Allergen Reactions  . Bactrim [Sulfamethoxazole-Trimethoprim] Other (See Comments)    Caused kidney failure     SOCIAL HISTORY:  Social History   Socioeconomic History  . Marital status: Married    Spouse name: Not on file  . Number of children: Not on file  . Years of education: Not on file  . Highest education level: Not on file  Occupational History  . Not on file  Social Needs  . Financial resource strain: Not on file  . Food insecurity:    Worry: Not on file    Inability: Not on file  . Transportation needs:    Medical: Not on file    Non-medical: Not on file  Tobacco Use  . Smoking status: Never Smoker  . Smokeless tobacco: Never Used  Substance and Sexual Activity  . Alcohol use: Not on file  . Drug use: Not on file  . Sexual activity: Not on file  Lifestyle  . Physical activity:    Days per week: Not on file    Minutes per session: Not on file  . Stress: Not on file  Relationships  . Social connections:    Talks on phone: Not on file    Gets together: Not on file    Attends  religious service: Not on file    Active member of club or organization: Not on file    Attends meetings of clubs or organizations: Not on file    Relationship status: Not on file  . Intimate partner violence:    Fear of current or ex partner: Not on file    Emotionally abused: Not on file    Physically abused: Not on file    Forced sexual activity: Not on file  Other Topics Concern  . Not on file  Social History Narrative  . Not on file     FAMILY HISTORY:  Family History  Problem Relation Age of Onset  . Stroke Mother   . Diabetes Father  REVIEW OF SYSTEMS: (as per history from patient and ICU physician) Constitutional: denies weight loss, chills, or sweats. Positive for fever  Eyes: denies any other vision changes, history of eye injury  ENT: denies sore throat, hearing problems  Respiratory: positive for shortness of breath, wheezing  Cardiovascular: denies chest pain, palpitations. Positive for irregular rythm Gastrointestinal: denies abdominal pain, positive for N/V, positive for diarrhea Genitourinary: denies burning with urination or urinary frequency Musculoskeletal: positive for foot pain. See HPI  Skin: denies any other rashes. Positive for left foot ulcer  Neurological: denies any other headache, dizziness, weakness  Psychiatric: denies any other depression, anxiety   All other review of systems were negative   VITAL SIGNS:  Temp:  [97.8 F (36.6 C)-98.9 F (37.2 C)] 98.5 F (36.9 C) (07/16 0800) Pulse Rate:  [71-100] 80 (07/16 0800) Resp:  [11-26] 15 (07/16 0800) BP: (126-166)/(54-95) 146/74 (07/16 0800) SpO2:  [75 %-99 %] 98 % (07/16 0818) FiO2 (%):  [28 %-40 %] 40 % (07/16 0817) Weight:  [162.8 kg (358 lb 14.5 oz)-165.2 kg (364 lb 3.2 oz)] 165.2 kg (364 lb 3.2 oz) (07/16 0439)     Height: 6\' 1"  (185.4 cm) Weight: (!) 165.2 kg (364 lb 3.2 oz) BMI (Calculated): 48.06   INTAKE/OUTPUT:  NGT: 2425 mL since yesterday afternoon.   PHYSICAL EXAM:   Constitutional:  -- Morbid obesity,  -- \Awake, alert, and oriented in person  Eyes:  -- Pupils equally round and reactive to light  -- No scleral icterus  Ear, nose, and throat:  -- No jugular venous distension  Pulmonary:  -- No crackles  -- Equal decreased breath sounds bilaterally -- Breathing non-labored at rest on BiPAP Cardiovascular:  -- S1, S2 present, irregular rythm -- No pericardial rubs Gastrointestinal:  -- Abdomen soft, nontender, non-distended, no guarding or rebound tenderness -- No abdominal masses appreciated, pulsatile or otherwise  Musculoskeletal and Integumentary:  -- significant bilateral leg edema. Left foot wounded. Bilateral dermatofibrosclerosis.  Neurologic:  -- Motor function: intact and symmetric -- Sensation: decreased in lower extremities.    Labs:  CBC Latest Ref Rng & Units 03/06/2018 03/05/2018  WBC 3.8 - 10.6 K/uL 12.5(H) 21.3(H)  Hemoglobin 13.0 - 18.0 g/dL 11.3(L) 12.6(L)  Hematocrit 40.0 - 52.0 % 34.6(L) 38.9(L)  Platelets 150 - 440 K/uL 205 272   CMP Latest Ref Rng & Units 03/07/2018 03/06/2018 03/05/2018  Glucose 70 - 99 mg/dL 409(W) 119(J) 478(G)  BUN 8 - 23 mg/dL 95(A) 21(H) 08(M)  Creatinine 0.61 - 1.24 mg/dL 5.78(I) 6.96(E) 9.52(W)  Sodium 135 - 145 mmol/L 148(H) 142 141  Potassium 3.5 - 5.1 mmol/L 3.5 3.6 3.9  Chloride 98 - 111 mmol/L 99 99 101  CO2 22 - 32 mmol/L 40(H) 32 28  Calcium 8.9 - 10.3 mg/dL 8.1(L) 7.8(L) 8.4(L)  Total Protein 6.5 - 8.1 g/dL - - 8.3(H)  Total Bilirubin 0.3 - 1.2 mg/dL - - 0.7  Alkaline Phos 38 - 126 U/L - - 81  AST 15 - 41 U/L - - 33  ALT 0 - 44 U/L - - 18    Imaging studies:  EXAM: CT ABDOMEN AND PELVIS WITHOUT CONTRAST  TECHNIQUE: Multidetector CT imaging of the abdomen and pelvis was performed following the standard protocol without IV contrast.  COMPARISON:  None.  FINDINGS: Lower chest: Respiratory motion artifact limits evaluation for small pulmonary nodules. Mild bibasilar  subsegmental atelectasis. Incompletely imaged cardiomegaly. Calcifications of the aortic valve and coronary arteries are noted. No pericardial effusion. Unremarkable  distal thoracic esophagus.  Hepatobiliary: Relative hypertrophy of the left hepatic lobe and caudate lobe with blunting of the free edge of the liver suggests morphologic changes of cirrhosis. No discrete hepatic mass, gallbladder abnormality or biliary ductal dilatation.  Pancreas: No pancreatic mass or inflammatory changes. There are a few punctate calcifications which may represent the sequelae of prior pancreatitis.  Spleen: Normal in size without focal abnormality.  Adrenals/Urinary Tract: Normal adrenal glands. No evidence of hydronephrosis or nephrolithiasis. Unremarkable ureters. Foley catheter present in the collapsed bladder.  Stomach/Bowel: Normal colon, appendix and terminal ileum. The stomach and duodenum are also unremarkable. However, there are several loops of dilated and air-filled small bowel left of midline in the abdomen without a definite transition point which are non-specific. No significant inflammatory changes in the adjacent mesentery and no evidence of interloop fluid.  Vascular/Lymphatic: Limited evaluation in the absence of intravenous contrast. No evidence of aneurysm. Calcifications present throughout the abdominal aorta. Abnormal lymphadenopathy is present in the left retroperitoneum beginning in the iliac chain. Multiple hypertrophic lymph nodes are present in the left common and external iliac nodal stations as well as in the left superficial inguinal region. Index lymph nodes as follows: Left superficial inguinal node 2.0 cm in short axis (image 109 series 2); left deep inguinal lymph node 1.5 cm in short axis (image 96 series 2); left external iliac node measures 2.7 cm in short axis (image 87 series 2); left external iliac node measures 2.2 cm in short axis (image 72 series  2); left common iliac node measures 1.5 cm in short axis (image 61 series 2). There are also mildly prominent, but not definitively enlarged nodes along the right iliac chain. No additional lymphadenopathy is identified.  Reproductive: Prostate is unremarkable.  Other: No abdominal wall hernia or abnormality. No abdominopelvic ascites.  Musculoskeletal: No acute fracture or aggressive appearing lytic or blastic osseous lesion. Multiple subacute to remote bilateral rib fractures, incompletely imaged. Multilevel degenerative disc disease and bilateral facet arthropathy. Mild bilateral hip joint osteoarthritis. Chronic right L5 pars fracture.  IMPRESSION: 1. The primary abnormality is lymphadenopathy along the left iliac and inguinal nodal stations. Differential considerations include reactive adenopathy if the patient has a significant or chronic infectious/inflammatory process involving the left lower extremity. Additionally, a lymphoproliferative process such as lymphoma, or less likely metastatic adenopathy are considerations. Lymphoma is favored in the absence of a known chronic left lower extremity infectious process. Recommend further evaluation with PET-CT to assess for hypermetabolic activity and to identify a suitable target for tissue sampling. 2. Several loops of gaseous distension of small bowel in the left mid abdomen without significant wall thickening or transition point. Differential considerations include mild gastroenteritis and potentially early or partial small bowel obstruction. 3. Morphologic changes of early hepatic cirrhosis. 4. Aortic valve calcifications. 5. Coronary artery atherosclerotic calcifications. 6.  Aortic Atherosclerosis (ICD10-170.0). 7. Foley catheter in place in the collapsed bladder. 8. Multiple bilateral subacute to remote rib fractures. 9. Multilevel degenerative disc disease and bilateral facet arthropathy. 10. Chronic right L5  pars fracture. 11. Mild bilateral hip joint osteoarthritis.  Electronically Signed   By: Malachy Moan M.D.   On: 03/05/2018 09:45  EXAM: ABDOMEN - 1 VIEW  COMPARISON:  CT abdomen pelvis from yesterday.  FINDINGS: Gaseous distention of the stomach. The bowel gas pattern is normal. No radio-opaque calculi or other significant radiographic abnormality are seen. No acute osseous abnormality.  IMPRESSION: 1. Gaseous distention of the stomach.  No small bowel obstruction.  Electronically Signed   By: Obie DredgeWilliam T Derry M.D.   On: 03/06/2018 15:27  Assessment/Plan:  67 y.o. male with left foot osteomyelitis and suspected small bowel obstruction, complicated by pertinent comorbidities including hypertension, diabetes mellitus type 2, chronic atrial fibrillation on apixaban, chronic kidney disease stage III, chronic osteomyelitis of the left heel and morbid obesity.   Upon evaluation the patient with morbid obesity with difficult physical exam. Patient currently on BiPAP. Patient consulted for suspected small bowel obstruction. On evaluation of physical exam, CT scan and subsequent abdominal xray, there is a predominance of gastric dilation. With patient current chronic medical condition, differential diagnosis include gastroparesis, gastroenteritis, gastric outlet obstruction, small bowel ileus, and/or small bowel obstruction. Patient without previous abdominal surgery that makes small bowel obstruction due to adhesions unlikely. Enteritis may be a cause of the partial bowel obstruction. Also patient with active infection, chronic history of diabetes, CKD makes gastroparesis vs ileus the most likely cause. Since patient is going for surgery of the foot today, I will recommend to continue NGT to decompress stomach and then tomorrow order a imaging study with contrast like Upper GI series with Gastrografin. Will continue to follow with you.    Gae GallopEdgardo Cintrn-Daz, MD

## 2018-03-07 NOTE — Progress Notes (Addendum)
ANTICOAGULATION CONSULT NOTE  Pharmacy Consult for heparin Indication: atrial fibrillation  Allergies  Allergen Reactions  . Bactrim [Sulfamethoxazole-Trimethoprim] Other (See Comments)    Caused kidney failure    Patient Measurements: Height: 6\' 1"  (185.4 cm) Weight: (!) 364 lb 3.2 oz (165.2 kg) IBW/kg (Calculated) : 79.9 Heparin Dosing Weight: 119.9 kg  Vital Signs: Temp: 98.8 F (37.1 C) (07/16 1200) Temp Source: Oral (07/16 1200) BP: 128/69 (07/16 1400) Pulse Rate: 86 (07/16 1400)  Labs: Recent Labs    03/05/18 0646 03/05/18 1336  03/06/18 0559 03/06/18 1547 03/06/18 2252 03/07/18 0554  HGB 12.6*  --   --  11.3*  --   --   --   HCT 38.9*  --   --  34.6*  --   --   --   PLT 272  --   --  205  --   --   --   APTT  --  35   < > 49* 66* 59* 70*  LABPROT 17.2*  --   --   --   --   --   --   INR 1.42  --   --   --   --   --   --   HEPARINUNFRC  --  1.61*  --  1.09*  --   --  1.16*  CREATININE 1.86*  --   --  1.95*  --   --  1.68*  TROPONINI 0.03*  --   --   --   --   --   --    < > = values in this interval not displayed.    Estimated Creatinine Clearance: 68.8 mL/min (A) (by C-G formula based on SCr of 1.68 mg/dL (H)).   Medical History: Past Medical History:  Diagnosis Date  . Atrial fibrillation, chronic (HCC)    On apixaban  . CKD (chronic kidney disease), stage III (HCC)   . Diabetes mellitus type 2 in obese (HCC)   . 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 History of hemodialysis    Bactrim mediated  Acute renal failure  . Hypertension   . Morbid obesity with BMI of 45.0-49.9, adult (HCC)   . Osteomyelitis of ankle or foot, acute, left (HCC)    chronic    Medications:  Infusions:  . sodium chloride 75 mL/hr at 03/07/18 1400  .  ceFAZolin (ANCEF) IV Stopped (03/07/18 1430)  . heparin 2,250 Units/hr (03/07/18 1406)    67 yom cc weakness with PMH DM, AF on Eliquis PTA, CHF, HTN. Per RN  patient and wife are both unable to provide detailed information regarding last Eliquis dose. Pharmacy consulted to dose heparin for AF. Baseline aPTT, PT/INR, and heparin level are ordered - Eliquis has been discontinued. Heparin currently infusing at 2250 units/hr.    Goal of Therapy:  Heparin level 0.3-0.7 units/ml aPTT 66 to 102 seconds Monitor platelets by anticoagulation protocol: Yes   Assessment/Plan:  APTT in therapeutic range, heparin held previously for possible surgery. Heparin resumed at 1406. APTT obtained at 1600 (approximately 2 hours after re-initiation of heparin). Will obtain aPTT at 2000 and adjust per protocol.   Patient's surgery is delayed pending resolution of bowel obstruction; anticipated 1-2 days.   Will monitor heparin levels daily until aPTTs and heparin level correlate.   Pharmacy will continue to monitor and adjust per consult.   Simpson,Michael L,  03/07/2018,3:53 PM

## 2018-03-07 NOTE — Progress Notes (Signed)
[facility] NAME: Karthikeya Funke    MR#:  [id number]  DATE OF BIRTH:  28-Sep-1950  SUBJECTIVE:   Patient was transferred to the ICU yesterday due to worsening hypercapnic respiratory failure.  Now weaned off the BiPAP.  Had an x-ray yesterday which showed gaseous distention with some ileus and now has an NG tube placed.  Much more awake and feels better and less nauseated today.  Plan was for surgical debridement of his left foot ulcer/osteomyelitis but this has been canceled due to his ileus.  REVIEW OF SYSTEMS:    Review of Systems  Constitutional: Negative for chills and fever.  HENT: Negative for congestion and tinnitus.   Eyes: Negative for blurred vision and double vision.  Respiratory: Positive for shortness of breath. Negative for cough and wheezing.   Cardiovascular: Negative for chest pain, orthopnea and PND.  Gastrointestinal: Negative for abdominal pain, diarrhea, nausea and vomiting.  Genitourinary: Negative for dysuria and hematuria.  Neurological: Positive for weakness. Negative for dizziness, sensory change and focal weakness.  All other systems reviewed and are negative.   Nutrition: NPO  Tolerating Diet: No Tolerating PT: Await Eval.   DRUG ALLERGIES:   Allergies  Allergen Reactions  . Bactrim [Sulfamethoxazole-Trimethoprim] Other (See Comments)    Caused kidney failure    VITALS:  Blood pressure 128/69, pulse 86, temperature 98.8 F (37.1 C), temperature source Oral, resp. rate 16, height _0  (1.854 m), weight (!) 165.2 kg (364 lb 3.2 oz), SpO2 97 %.  PHYSICAL EXAMINATION:   Physical Exam  GENERAL:  67 y.o.-year-old morbidly obese patient lying in bed in NAD.  EYES: Pupils equal, round, reactive to light and accommodation. No scleral icterus. Extraocular muscles intact.  HEENT: Head atraumatic, normocephalic. NG tube in place and draining some bilious fluid.  NECK:  Supple, no jugular venous  distention. No thyroid enlargement, no tenderness.  LUNGS: Normal breath sounds bilaterally, no wheezing, rales, rhonchi. No use of accessory muscles of respiration.  CARDIOVASCULAR: S1, S2 normal. No murmurs, rubs, or gallops.  ABDOMEN: Soft, nontender, protuberant, nondistended. Bowel sounds present. No organomegaly or mass.  EXTREMITIES: No cyanosis, clubbing, +1-2 edema from the knees and ankles bilaterally. NEUROLOGIC: Cranial nerves II through XII are intact.  Focal motor or sensory deficits appreciated bilaterally, globally weak. PSYCHIATRIC: The patient is alert and oriented x 3.  SKIN: No obvious rash, lesion, or ulcer.  Left foot plantar diabetic foot ulcer with underlying osteomyelitis covered in Kerlix wrap. Signs of chronic venous stasis b/l.    LABORATORY PANEL:   CBC Recent Labs  Lab 03/06/18 0559  WBC 12.5*  HGB 11.3*  HCT 34.6*  PLT 205   ------------------------------------------------------------------------------------------------------------------  Chemistries  Recent Labs  Lab 03/05/18 0646  03/07/18 0554  NA 141   < > 148*  K 3.9   < > 3.5  CL 101   < > 99  CO2 28   < > 40*  GLUCOSE 173*   < > 112*  BUN 29*   < > 31*  CREATININE 1.86*   < > 1.68*  CALCIUM 8.4*   < > 8.1*  AST 33  --   --   ALT 18  --   --   ALKPHOS 81  --   --   BILITOT 0.7  --   --    < > = values in this interval not displayed.   ------------------------------------------------------------------------------------------------------------------  Cardiac Enzymes Recent Labs  Lab 03/05/18  6283  TROPONINI 0.03*   ------------------------------------------------------------------------------------------------------------------  RADIOLOGY:  Dg Chest 1 View  Result Date: 03/07/2018 CLINICAL DATA:  Shortness of breath. EXAM: CHEST  1 VIEW COMPARISON:  03/05/2018 FINDINGS: The heart is enlarged but stable. There is tortuosity and calcification of the thoracic aorta. Bibasilar  infiltrates are noted. No definite pleural effusions. Vascular congestion without overt pulmonary edema. The bony structures are grossly intact. Advanced degenerative changes involving the right shoulder. IMPRESSION: Bibasilar infiltrates. Vascular congestion without overt pulmonary edema or definite pleural effusions. Electronically Signed   By: Marijo Sanes M.D.   On: 03/07/2018 07:54   Dg Abd 1 View  Result Date: 03/06/2018 CLINICAL DATA:  67 year old male post nasogastric tube placement. Initial encounter. EXAM: ABDOMEN - 1 VIEW COMPARISON:  [id number] p.m. FINDINGS: Nasogastric tube placed with tip and side hole gastric fundus level. Gas distended stomach. IMPRESSION: Nasogastric tube tip and side hole gastric fundus level. Gas distended stomach. Electronically Signed   By: Genia Del M.D.   On: 03/06/2018 15:35   Dg Abd 1 View  Result Date: 03/06/2018 CLINICAL DATA:  Nausea and vomiting. EXAM: ABDOMEN - 1 VIEW COMPARISON:  CT abdomen pelvis from yesterday. FINDINGS: Gaseous distention of the stomach. The bowel gas pattern is normal. No radio-opaque calculi or other significant radiographic abnormality are seen. No acute osseous abnormality. IMPRESSION: 1. Gaseous distention of the stomach.  No small bowel obstruction. Electronically Signed   By: Titus Dubin M.D.   On: 03/06/2018 15:27   US Renal  Result Date: 03/06/2018 CLINICAL DATA:  67 year old male with chronic kidney disease. Acute kidney failure. Initial encounter. EXAM: RENAL / URINARY TRACT ULTRASOUND COMPLETE COMPARISON:  03/05/2018 CT. FINDINGS: Right Kidney: Length: 13.2 cm. Echogenicity within normal limits. No mass or hydronephrosis visualized. Minimal perinephric fluid noted. Left Kidney: Length: 13.1 cm. Echogenicity within normal limits. No mass or hydronephrosis visualized. Minimal perinephric fluid noted. Bladder: Decompressed by Foley catheter. IMPRESSION: No hydronephrosis. Electronically Signed   By: Genia Del M.D.    On: 03/06/2018 12:09     ASSESSMENT AND PLAN:   67 year old male with past medical history of morbid obesity, diabetes, obstructive sleep apnea, hypertension, chronic kidney disease stage III, chronic atrial fibrillation who presented to the hospital due to generalized weakness, lethargy and noted to have sepsis secondary to left foot osteomyelitis.  1.  Sepsis- patient met criteria given his leukocytosis, hypotension, leukocytosis and noted to have infected left foot diabetic ulcer with osteomyelitis. -Blood cultures are positive for E. coli and it is not ESBL.  Switched from IV meropenem to IV Ancef for now. - Urinalysis was negative for UTI.  2.  Acute on chronic respiratory failure with hypercapnia-secondary to underlying obstructive sleep apnea, obesity pickwickian syndrome. - Patient developed worsening hypercapnia yesterday was transferred to the ICU.  Was placed on BiPAP and has improved and currently off BiPAP.  3.  Ileus- patient was having persistent nausea vomiting yesterday.  Had a KUB/abdominal x-ray which was suggestive of gaseous distention.  Status post NG tube placement and patient had almost a liter of fluid that drained out and he felt much better. -Continue NG tube to intermittent suction for now.  Follow serial x-rays.  4.  Left foot osteomyelitis- seen by podiatry and plan for surgical debridement today but due to his ileus/SBO anesthesia has cancelled the surgery for now.  -Plan for surgical debridement and angiogram once his ileus has improved.  Probably in the next 1 to 2 days.. -Continue local wound care as  per podiatry, continue IV Ancef.  5.  Chronic diastolic CHF-continue carvedilol, Bumex.  6. CKD stage III-seen by nephrology, creatinine close to baseline we will continue to monitor. -Renal ultrasound showing no evidence of obstruction or hydronephrosis.  7.  Diabetes type 2 without complication-continue sliding scale insulin.  Blood sugar stable.  8.   Hyperlipidemia-continue atorvastatin.  9. Chronic A. Fib - rate controlled.  - hold Eliquis. Cont. Heparin gtt, cont. Coreg.    All the records are reviewed and case discussed with Care Management/Social Worker. Management plans discussed with the patient, family and they are in agreement.  CODE STATUS: Full code  DVT Prophylaxis: Heparin gtt  TOTAL Critical Care TIME TAKING CARE OF THIS PATIENT: 30 minutes.   POSSIBLE D/C unclear , DEPENDING ON CLINICAL CONDITION.   Henreitta Leber M.D on 03/07/2018 at 3:12 PM  Between 7am to 6pm - Pager - [phone]  After 6pm go to [email or web address] - Proofreader  [facility]  Office  [phone]  CC: Primary care physician; Neta Ehlers, MD

## 2018-03-07 NOTE — Progress Notes (Signed)
Pharmacy Antibiotic Note  Alan Dillon is a 67 y.o. male admitted on 03/05/2018 with Ecoli bacteremia. Patient with left foot osteomyelitis with scheduled surgical debridement after resolution of ileus. Pharmacy has been consulted for cefazolin dosing.  Plan: Transition patient to cefazolin 2g IV Q8hr.   Height: 6\' 1"  (185.4 cm) Weight: (!) 364 lb 3.2 oz (165.2 kg) IBW/kg (Calculated) : 79.9  Temp (24hrs), Avg:98.5 F (36.9 C), Min:97.8 F (36.6 C), Max:98.9 F (37.2 C)  Recent Labs  Lab 03/05/18 0646 03/05/18 0957 03/06/18 0559 03/06/18 1258 03/07/18 0548 03/07/18 0554  WBC 21.3*  --  12.5*  --   --   --   CREATININE 1.86*  --  1.95*  --   --  1.68*  LATICACIDVEN 3.0* 1.9  --  0.9 1.2  --     Estimated Creatinine Clearance: 68.8 mL/min (A) (by C-G formula based on SCr of 1.68 mg/dL (H)).    Allergies  Allergen Reactions  . Bactrim [Sulfamethoxazole-Trimethoprim] Other (See Comments)    Caused kidney failure    Antimicrobials this admission: Zosyn 7/14  Vancomycin 7/14 Meropenem 7/14 >> 7/16  Cefazolin 7/16 >>   Dose adjustments this admission: N/A  Microbiology results: 7/14 BCx: Ecoli - S to cefazolin  7/14 UCx: no growth  7/15 MRSA PCR: negative   Thank you for allowing pharmacy to be a part of this patient's care.  Soraida Vickers L 03/07/2018 4:02 PM

## 2018-03-07 NOTE — Progress Notes (Signed)
Patient transferred to the floor from ICU, patient alert and orieted, denies any pain at this time, vss, NG tube in place to intermittent low wall suction , denies any nausea or vomiting

## 2018-03-07 NOTE — Progress Notes (Signed)
[facility]  ROUNDING NOTE   Subjective:   Moved to ICU due to hypoxia and unresponsive.   Today, wife at bedside. Patient sitting up in bed. Scheduled for surgery later today.  Afebrile  UOP 2310  Creatinine 1.68 (1.95)  1/2NS at 53mL/hr  CXR with infiltrates  NG tube placed - concern for small bowel obstruction  Objective:  Vital signs in last 24 hours:  Temp:  [97.8 F (36.6 C)-98.9 F (37.2 C)] 98.5 F (36.9 C) (07/16 0800) Pulse Rate:  [71-100] 80 (07/16 0800) Resp:  [11-26] 15 (07/16 0800) BP: (126-166)/(54-95) 146/74 (07/16 0800) SpO2:  [75 %-99 %] 98 % (07/16 0818) FiO2 (%):  [28 %-40 %] 40 % (07/16 0817) Weight:  [162.8 kg (358 lb 14.5 oz)-165.2 kg (364 lb 3.2 oz)] 165.2 kg (364 lb 3.2 oz) (07/16 0439)  Weight change: -3.67 kg (-8 lb 1.5 oz) Filed Weights   03/06/18 0325 03/06/18 1244 03/07/18 0439  Weight: (!) 167.2 kg (368 lb 8 oz) (!) 162.8 kg (358 lb 14.5 oz) (!) 165.2 kg (364 lb 3.2 oz)    Intake/Output: I/O last 3 completed shifts: In: 2151.7 [I.V.:1651.7; IV Piggyback:500] Out: 5535 [Urine:3110; Emesis/NG output:2425]   Intake/Output this shift:  No intake/output data recorded.  Physical Exam: General: NAD, sitting up in bed  Head: Normocephalic, atraumatic. Moist oral mucosal membranes  Eyes: Anicteric, PERRL  Neck: Supple, trachea midline  Lungs:  Clear to auscultation  Heart: Regular rate and rhythm  Abdomen:  Soft, nontender,   Extremities:  bilateral peripheral edema.  Neurologic: Nonfocal, moving all four extremities  Skin: No lesions        Basic Metabolic Panel: Recent Labs  Lab 03/05/18 0646 03/06/18 0559 03/07/18 0554  NA 141 142 148*  K 3.9 3.6 3.5  CL 101 99 99  CO2 28 32 40*  GLUCOSE 173* 148* 112*  BUN 29* 33* 31*  CREATININE 1.86* 1.95* 1.68*  CALCIUM 8.4* 7.8* 8.1*  PHOS  --   --  3.0    Liver Function Tests: Recent Labs  Lab 03/05/18 0646 03/07/18 0554  AST 33  --   ALT 18  --   ALKPHOS  81  --   BILITOT 0.7  --   PROT 8.3*  --   ALBUMIN 3.4* 2.7*   Recent Labs  Lab 03/05/18 0646  LIPASE 28   Recent Labs  Lab 03/07/18 0548  AMMONIA <9*    CBC: Recent Labs  Lab 03/05/18 0646 03/06/18 0559  WBC 21.3* 12.5*  NEUTROABS 19.9*  --   HGB 12.6* 11.3*  HCT 38.9* 34.6*  MCV 87.0 86.8  PLT 272 205    Cardiac Enzymes: Recent Labs  Lab 03/05/18 0646  TROPONINI 0.03*    BNP: Invalid input(s): POCBNP  CBG: Recent Labs  Lab 03/06/18 1155 03/06/18 1238 03/06/18 1626 03/06/18 2116 03/07/18 0720  GLUCAP 147* 145* 149* 119* 96    Microbiology: Results for orders placed or performed during the hospital encounter of 03/05/18  Blood Culture (routine x 2)     Status: Abnormal   Collection Time: 03/05/18  6:47 AM  Result Value Ref Range Status   Specimen Description   Final    BLOOD RIGHT FOREARM Performed at [facility], [address], [place], [place] [address]    Special Requests   Final    BOTTLES DRAWN AEROBIC AND ANAEROBIC Blood Culture adequate volume Performed at [facility], [address] [place], [place] [address]  Culture  Setup Time   Final    GRAM NEGATIVE RODS IN BOTH AEROBIC AND ANAEROBIC BOTTLES CRITICAL RESULT CALLED TO, READ BACK BY AND VERIFIED WITH: SHEEMA HALLAJI @ 1611 ON 03/05/2018 BY CAF Performed at [facility], [address] [place], [place] [address]    Culture (A)  Final    ESCHERICHIA COLI SUSCEPTIBILITIES PERFORMED ON PREVIOUS CULTURE WITHIN THE LAST 5 DAYS. Performed at [facility], [address] [place], [place] [address]    Report Status 03/07/2018 FINAL  Final  Blood Culture (routine x 2)     Status: Abnormal   Collection Time: 03/05/18  6:49 AM  Result Value Ref Range Status   Specimen Description   Final    BLOOD RIGHT HAND Performed at [facility], [address] [place], [place] [address]    Special Requests   Final    BOTTLES DRAWN AEROBIC AND  ANAEROBIC Blood Culture results may not be optimal due to an excessive volume of blood received in culture bottles Performed at [facility], [address] [place], [place] [address]    Culture  Setup Time   Final    IN BOTH AEROBIC AND ANAEROBIC BOTTLES GRAM NEGATIVE RODS CRITICAL RESULT CALLED TO, READ BACK BY AND VERIFIED WITH: SHEEMA HALLAJI @ 1611 ON 03/05/2018 BY CAF Performed at [facility], [address], [place], [place] [address]    Culture ESCHERICHIA COLI (A)  Final   Report Status 03/07/2018 FINAL  Final   Organism ID, Bacteria ESCHERICHIA COLI  Final      Susceptibility   Escherichia coli - MIC*    AMPICILLIN >=32 RESISTANT Resistant     CEFAZOLIN <=4 SENSITIVE Sensitive     CEFEPIME <=1 SENSITIVE Sensitive     CEFTAZIDIME <=1 SENSITIVE Sensitive     CEFTRIAXONE <=1 SENSITIVE Sensitive     CIPROFLOXACIN >=4 RESISTANT Resistant     GENTAMICIN <=1 SENSITIVE Sensitive     IMIPENEM <=0.25 SENSITIVE Sensitive     TRIMETH/SULFA >=320 RESISTANT Resistant     AMPICILLIN/SULBACTAM 16 INTERMEDIATE Intermediate     PIP/TAZO <=4 SENSITIVE Sensitive     Extended ESBL NEGATIVE Sensitive     * ESCHERICHIA COLI  Blood Culture ID Panel (Reflexed)     Status: Abnormal   Collection Time: 03/05/18  6:49 AM  Result Value Ref Range Status   Enterococcus species NOT DETECTED NOT DETECTED Final   Listeria monocytogenes NOT DETECTED NOT DETECTED Final   Staphylococcus species NOT DETECTED NOT DETECTED Final   Staphylococcus aureus NOT DETECTED NOT DETECTED Final   Streptococcus species NOT DETECTED NOT DETECTED Final   Streptococcus agalactiae NOT DETECTED NOT DETECTED Final   Streptococcus pneumoniae NOT DETECTED NOT DETECTED Final   Streptococcus pyogenes NOT DETECTED NOT DETECTED Final   Acinetobacter baumannii NOT DETECTED NOT DETECTED Final   Enterobacteriaceae species DETECTED (A) NOT DETECTED Final    Comment: Enterobacteriaceae represent a large family of  gram-negative bacteria, not a single organism. CRITICAL RESULT CALLED TO, READ BACK BY AND VERIFIED WITH: SHEEMA HALLAJI @ 1611 ON 03/05/2018 BY CAF    Enterobacter cloacae complex NOT DETECTED NOT DETECTED Final   Escherichia coli DETECTED (A) NOT DETECTED Final    Comment: CRITICAL RESULT CALLED TO, READ BACK BY AND VERIFIED WITH: SHEEMA HALLAJI @ 1611 ON 03/05/2018 BY CAF    Klebsiella oxytoca NOT DETECTED NOT DETECTED Final   Klebsiella pneumoniae NOT DETECTED NOT DETECTED Final   Proteus species  NOT DETECTED NOT DETECTED Final   Serratia marcescens NOT DETECTED NOT DETECTED Final   Carbapenem resistance NOT DETECTED NOT DETECTED Final   Haemophilus influenzae NOT DETECTED NOT DETECTED Final   Neisseria meningitidis NOT DETECTED NOT DETECTED Final   Pseudomonas aeruginosa NOT DETECTED NOT DETECTED Final   Candida albicans NOT DETECTED NOT DETECTED Final   Candida glabrata NOT DETECTED NOT DETECTED Final   Candida krusei NOT DETECTED NOT DETECTED Final   Candida parapsilosis NOT DETECTED NOT DETECTED Final   Candida tropicalis NOT DETECTED NOT DETECTED Final    Comment: Performed at [facility], [address], [place], [place] [address]  Urine culture     Status: None   Collection Time: 03/05/18  7:19 AM  Result Value Ref Range Status   Specimen Description   Final    URINE, RANDOM Performed at [facility], [address] [place], [place] [address]    Special Requests   Final    NONE Performed at [facility], [address] [place], [place] [address]    Culture   Final    NO GROWTH Performed at [facility], [address] [place], [place] [address]    Report Status 03/06/2018 FINAL  Final  MRSA PCR Screening     Status: None   Collection Time: 03/06/18 12:58 PM  Result Value Ref Range Status   MRSA by PCR NEGATIVE NEGATIVE Final    Comment:        The GeneXpert MRSA Assay (FDA approved for NASAL specimens only), is one  component of a comprehensive MRSA colonization surveillance program. It is not intended to diagnose MRSA infection nor to guide or monitor treatment for MRSA infections. Performed at [facility], [address] [place], [place] [address]     Coagulation Studies: Recent Labs    03/05/18 0646  LABPROT 17.2*  INR 1.42    Urinalysis: Recent Labs    03/05/18 0719  COLORURINE YELLOW*  LABSPEC 1.014  PHURINE 5.0  GLUCOSEU NEGATIVE  HGBUR MODERATE*  BILIRUBINUR NEGATIVE  KETONESUR NEGATIVE  PROTEINUR 100*  NITRITE NEGATIVE  LEUKOCYTESUR NEGATIVE      Imaging: Dg Chest 1 View  Result Date: 03/07/2018 CLINICAL DATA:  Shortness of breath. EXAM: CHEST  1 VIEW COMPARISON:  03/05/2018 FINDINGS: The heart is enlarged but stable. There is tortuosity and calcification of the thoracic aorta. Bibasilar infiltrates are noted. No definite pleural effusions. Vascular congestion without overt pulmonary edema. The bony structures are grossly intact. Advanced degenerative changes involving the right shoulder. IMPRESSION: Bibasilar infiltrates. Vascular congestion without overt pulmonary edema or definite pleural effusions. Electronically Signed   By: Rudie Meyer M.D.   On: 03/07/2018 07:54   Dg Abd 1 View  Result Date: 03/06/2018 CLINICAL DATA:  67 year old male post nasogastric tube placement. Initial encounter. EXAM: ABDOMEN - 1 VIEW COMPARISON:  [id number] p.m. FINDINGS: Nasogastric tube placed with tip and side hole gastric fundus level. Gas distended stomach. IMPRESSION: Nasogastric tube tip and side hole gastric fundus level. Gas distended stomach. Electronically Signed   By: Lacy Duverney M.D.   On: 03/06/2018 15:35   Dg Abd 1 View  Result Date: 03/06/2018 CLINICAL DATA:  Nausea and vomiting. EXAM: ABDOMEN - 1 VIEW COMPARISON:  CT abdomen pelvis from yesterday. FINDINGS: Gaseous distention of the stomach. The bowel gas pattern is normal. No radio-opaque calculi or other  significant radiographic abnormality are seen. No acute osseous abnormality. IMPRESSION: 1. Gaseous distention of the stomach.  No small bowel obstruction. Electronically Signed   By: Obie Dredge M.D.   On: 03/06/2018 15:27   US Renal  Result Date: 03/06/2018 CLINICAL DATA:  67 year old male with chronic kidney disease. Acute kidney failure. Initial encounter. EXAM: RENAL / URINARY TRACT ULTRASOUND COMPLETE COMPARISON:  03/05/2018 CT. FINDINGS: Right Kidney: Length: 13.2 cm. Echogenicity within normal limits. No mass or hydronephrosis visualized. Minimal perinephric fluid noted. Left Kidney: Length: 13.1 cm. Echogenicity within normal limits. No mass or hydronephrosis visualized. Minimal perinephric fluid noted. Bladder: Decompressed by Foley catheter. IMPRESSION: No hydronephrosis. Electronically Signed   By: Lacy Duverney M.D.   On: 03/06/2018 12:09   Mr Foot Left Wo Contrast  Result Date: 03/05/2018 CLINICAL DATA:  Plantar ulceration of the left foot with severe lower extremity edema. Evaluate for osteomyelitis. EXAM: MRI OF THE LEFT FOOT WITHOUT CONTRAST TECHNIQUE: Multiplanar, multisequence MR imaging of the left foot was performed. No intravenous contrast was administered. Osteomyelitis protocol MRI of the foot was obtained, to include the entire foot and ankle. This protocol uses a large field of view to cover the entire foot and ankle, and is suitable for assessing bony structures for osteomyelitis. Due to the large field of view and imaging plane choice, this protocol is less sensitive for assessing small structures such as ligamentous structures of the foot and ankle, compared to a dedicated forefoot or dedicated hindfoot exam. COMPARISON:  Left foot x-rays from same day. FINDINGS: Bones/Joint/Cartilage Cortical irregularity and focal marrow edema with decreased T1 marrow signal in the plantar posterior calcaneus, consistent with osteomyelitis. No fracture or dislocation. Severe pes planus.  Hindfoot valgus with degenerative marrow edema and cystic change in the lateral talus and articulating calcaneus. Small amount of marrow edema and irregularity of the distal fibula. Mild osteoarthritis of the first MTP joint. Moderate osteoarthritis of the first IP joint. No joint effusion. Ligaments Grossly intact. Muscles and Tendons Flexor, peroneal and extensor compartment tendons are intact. Severe fatty atrophy of the foot muscles. Soft tissue Large plantar hindfoot soft tissue ulceration. No fluid collection or hematoma. No soft tissue mass. Moderate skin thickening and soft tissue swelling of the foot. IMPRESSION: 1. Large plantar hindfoot soft tissue ulcer with osteomyelitis of the plantar calcaneus. No abscess. 2. Severe pes planovalgus with evidence of lateral hindfoot impingement. Electronically Signed   By: Obie Dredge M.D.   On: 03/05/2018 13:13     Medications:   . sodium chloride 75 mL/hr at 03/07/18 0600  . heparin Stopped (03/07/18 0806)  . meropenem (MERREM) IV 200 mL/hr at 03/07/18 0600   . albuterol  3 mL Inhalation BID  . brimonidine  1 drop Both Eyes BID  . docusate sodium  100 mg Oral BID  . ferrous sulfate  325 mg Oral BID  . insulin aspart  0-15 Units Subcutaneous TID WC  . insulin aspart  3 Units Subcutaneous TID WC  . ipratropium  0.5 mg Nebulization Q6H  . mouth rinse  15 mL Mouth Rinse BID  . metoprolol tartrate  2.5 mg Intravenous Q6H  . multivitamin with minerals  1 tablet Oral Daily   acetaminophen **OR** acetaminophen, ondansetron **OR** ondansetron (ZOFRAN) IV, phenol, promethazine  Assessment/ Plan:  Mr. Alan Dillon is a 67 y.o. white male with osteomyelitis, hypertension, diabetes mellitus type II, atrial fibrillation, history of acute kidney injury requiring hemodialysis, obstructive sleep apnea , who was admitted to [facility] on 03/05/2018 for Ulcer of left foot   1. Acute renal failure on chronic kidney disease stage  III with proteinuria: History of  acute renal failure requiring hemodialysis. Reports baseline creatinine around 2.  Chronic kidney disease secondary diabetes, hypertension and acute injury with limited recovery.  Acute kidney failure secondary to sepsis, bacteremia, E. Coli UTI - Nonoliguric urine output.  - Continue  IV fluids  - Holding bumetanide.   2. Hypertension:  Home regimen of bumetanide, carvedilol, and hydralazine.   3. Diabetes mellitus type II with chronic kidney disease: insulin dependent.  Complication of left lower extremity ulcer - hold metformin - Pending hemoglobin A1c - Appreciate podiatry and vascular input. Debridement scheduled for Tuesday 7/16.   4. Small bowel obstruction: NGT placed. Appreciate surgery input.    LOS: 2 Dean Goldner 7/16/201910:39 AM

## 2018-03-07 NOTE — Progress Notes (Signed)
CRITICAL CARE NOTE  CC  follow up sepsis secondary to osteomyelitis of left plantar calcaneal with possible partial small bowel obstruction  SUBJECTIVE 67 year old male with sepsis secondary to left plantar calcaneal osteomyelitis with possible partial bowel obstruction. Patient states he is feeling much better today and was able to get some sleep last night. He has no complaints today, but is requesting a drink of water.  SIGNIFICANT EVENTS Patient placed on BiPAP last night due to hypercapnia (73). BiPAP continued this morning. Repeat ABG this morning (7/16) with improvement. CO2 (69) and pH 7.44.   BP (!) 146/74 (BP Location: Left Wrist)   Pulse 80   Temp 98.5 F (36.9 C) (Axillary)   Resp 15   Ht 6\' 1"  (1.854 m)   Wt (!) 165.2 kg (364 lb 3.2 oz)   SpO2 98%   BMI 48.05 kg/m    REVIEW OF SYSTEMS  PATIENT IS UNABLE TO PROVIDE COMPLETE REVIEW OF SYSTEMS DUE TO CRITICAL ILLNESS   PHYSICAL EXAMINATION:  GENERAL:critically ill appearing, -resp distress HEAD: Normocephalic, atraumatic.  EYES: Pupils equal, round, reactive to light.  No scleral icterus.  MOUTH: Moist mucosal membrane. NECK: Supple. No thyromegaly. No nodules. No JVD.  PULMONARY: lungs clear bilaterally. No rales, wheezes CARDIOVASCULAR: S1 and S2. Regular rate and irregular rhythm. No murmurs, rubs, or gallops.  GASTROINTESTINAL: Soft, nontender, -distended. No masses. Diminished bowel sounds. No hepatosplenomegaly.  MUSCULOSKELETAL: 2+ pitting edema of right leg up to mid shin, 1+ dorsalis pedis pulse felt. Left leg with chronic ulceration of plantar calcaneus. Edema of left foot. NEUROLOGIC: alert and oriented x 3. SKIN:intact,warm,dry  ASSESSMENT AND PLAN SYNOPSIS  67 year old male with sepsis secondary to chronic left plantar calcaneal ulceration. MRI shows osteomyelitis.    PULMONARY -Acute on chronic hypercarbic respiratory failure with possible underlying Obesity hypoventilation syndrome -BiPAP  tolerated over night and taken off this morning. Repeat ABG shows improvement -Continue 2L of nasal cannula -continue atorvent -continue BiPAP at night  GASTROENTEROLOGY: -continue NG tube, >2L of fluid drawn out since placement, continue suction -general surgery consult for possible partial small bowel obstruction: recommends continued NG tube with suction and further studies tomorrow   RENAL -CKD stage III with AKI secondary to E. Coli bacteremia -Renal US normal, Hold bumetadine -creatinine has improved: 1.95--> 1.68 -continue IVF   NEUROLOGY -encephalopathy resolved -continue monitoring neurological status   CARDIAC -chronic atrial fibrillation with congestive heart failure -ICU monitoring -echo performed 03/07/2018. Results pending -heparin drip   ID -continue IV abx as prescribed: d/c meropenem  -START cefazolin -blood culture 7/14: positive for E. Coli, sensitive to cefazolin  -repeat blood culture today -CBC tomorrow -Anesthesia would like to wait for surgery until GI concerns resolve.    ENDOCRINE: -d/c insulin meal coverage as patient is NPO -monitor blood glucose  DVT: heparin prophylaxis  -DVT study pending   TRANSFUSIONS AS NEEDED MONITOR FSBS ASSESS the need for LABS as needed   Critical Care Time devoted to patient care services described in this note is 30 minutes.   Overall, patient is critically ill, prognosis is guarded.

## 2018-03-07 NOTE — Progress Notes (Signed)
*  PRELIMINARY RESULTS* Echocardiogram 2D Echocardiogram has been performed.  Cristela BlueHege, Antwan Bribiesca 03/07/2018, 1:21 PM

## 2018-03-07 NOTE — Progress Notes (Signed)
Subjective/Chief Complaint: Patient seen.  Awake and alert.  States he does feel better.   Objective: Vital signs in last 24 hours: Temp:  [97.8 F (36.6 C)-98.9 F (37.2 C)] 98.5 F (36.9 C) (07/16 0800) Pulse Rate:  [71-87] 81 (07/16 0900) Resp:  [11-26] 16 (07/16 0900) BP: (126-166)/(54-89) 163/86 (07/16 0900) SpO2:  [75 %-98 %] 96 % (07/16 0900) FiO2 (%):  [28 %-40 %] 40 % (07/16 0817) Weight:  [165.2 kg (364 lb 3.2 oz)] 165.2 kg (364 lb 3.2 oz) (07/16 0439) Last BM Date: 03/06/18  Intake/Output from previous day: 07/15 0701 - 07/16 0700 In: 2151.7 [I.V.:1651.7; IV Piggyback:500] Out: 4735 [Urine:2310; Emesis/NG output:2425] Intake/Output this shift: Total I/O In: -  Out: 750 [Emesis/NG output:750]  Significant drainage coming through the bandaging on the left foot and ankle area.  Some purulence noted on the gauze.  Plantar wound remains full-thickness and no significant change.  Lab Results:  Recent Labs    03/05/18 0646 03/06/18 0559  WBC 21.3* 12.5*  HGB 12.6* 11.3*  HCT 38.9* 34.6*  PLT 272 205   BMET Recent Labs    03/06/18 0559 03/07/18 0554  NA 142 148*  K 3.6 3.5  CL 99 99  CO2 32 40*  GLUCOSE 148* 112*  BUN 33* 31*  CREATININE 1.95* 1.68*  CALCIUM 7.8* 8.1*   PT/INR Recent Labs    03/05/18 0646  LABPROT 17.2*  INR 1.42   ABG Recent Labs    03/07/18 0321 03/07/18 0949  PHART 7.44 7.44  HCO3 43.5* 46.9*    Studies/Results: Dg Chest 1 View  Result Date: 03/07/2018 CLINICAL DATA:  Shortness of breath. EXAM: CHEST  1 VIEW COMPARISON:  03/05/2018 FINDINGS: The heart is enlarged but stable. There is tortuosity and calcification of the thoracic aorta. Bibasilar infiltrates are noted. No definite pleural effusions. Vascular congestion without overt pulmonary edema. The bony structures are grossly intact. Advanced degenerative changes involving the right shoulder. IMPRESSION: Bibasilar infiltrates. Vascular congestion without overt  pulmonary edema or definite pleural effusions. Electronically Signed   By: Rudie MeyerP.  Gallerani M.D.   On: 03/07/2018 07:54   Dg Abd 1 View  Result Date: 03/06/2018 CLINICAL DATA:  67 year old male post nasogastric tube placement. Initial encounter. EXAM: ABDOMEN - 1 VIEW COMPARISON:  [id number] 222 3535 p.m. FINDINGS: Nasogastric tube placed with tip and side hole gastric fundus level. Gas distended stomach. IMPRESSION: Nasogastric tube tip and side hole gastric fundus level. Gas distended stomach. Electronically Signed   By: Lacy DuverneySteven  Olson M.D.   On: 03/06/2018 15:35   Dg Abd 1 View  Result Date: 03/06/2018 CLINICAL DATA:  Nausea and vomiting. EXAM: ABDOMEN - 1 VIEW COMPARISON:  CT abdomen pelvis from yesterday. FINDINGS: Gaseous distention of the stomach. The bowel gas pattern is normal. No radio-opaque calculi or other significant radiographic abnormality are seen. No acute osseous abnormality. IMPRESSION: 1. Gaseous distention of the stomach.  No small bowel obstruction. Electronically Signed   By: Obie DredgeWilliam T Derry M.D.   On: 03/06/2018 15:27   [place] Renal  Result Date: 03/06/2018 CLINICAL DATA:  67 year old male with chronic kidney disease. Acute kidney failure. Initial encounter. EXAM: RENAL / URINARY TRACT ULTRASOUND COMPLETE COMPARISON:  03/05/2018 CT. FINDINGS: Right Kidney: Length: 13.2 cm. Echogenicity within normal limits. No mass or hydronephrosis visualized. Minimal perinephric fluid noted. Left Kidney: Length: 13.1 cm. Echogenicity within normal limits. No mass or hydronephrosis visualized. Minimal perinephric fluid noted. Bladder: Decompressed by Foley catheter. IMPRESSION: No hydronephrosis. Electronically Signed   By: Viviann SpareSteven  Constance Goltz M.D.   On: 03/06/2018 12:09    Anti-infectives: Anti-infectives (From admission, onward)   Start     Dose/Rate Route Frequency Ordered Stop   03/07/18 1400  ceFAZolin (ANCEF) IVPB 2g/100 mL premix     2 g 200 mL/hr over 30 Minutes Intravenous Every 8 hours 03/07/18 1201      03/05/18 1800  meropenem (MERREM) 1 g in sodium chloride 0.9 % 100 mL IVPB  Status:  Discontinued     1 g 200 mL/hr over 30 Minutes Intravenous Every 8 hours 03/05/18 1622 03/07/18 1055   03/05/18 1430  vancomycin (VANCOCIN) 1,750 mg in sodium chloride 0.9 % 500 mL IVPB  Status:  Discontinued     1,750 mg 250 mL/hr over 120 Minutes Intravenous Every 24 hours 03/05/18 1351 03/05/18 1620   03/05/18 1400  piperacillin-tazobactam (ZOSYN) IVPB 3.375 g  Status:  Discontinued     3.375 g 12.5 mL/hr over 240 Minutes Intravenous Every 8 hours 03/05/18 1334 03/05/18 1620   03/05/18 0715  piperacillin-tazobactam (ZOSYN) IVPB 3.375 g     3.375 g 100 mL/hr over 30 Minutes Intravenous  Once 03/05/18 0712 03/05/18 0745   03/05/18 0715  vancomycin (VANCOCIN) IVPB 1000 mg/200 mL premix     1,000 mg 200 mL/hr over 60 Minutes Intravenous  Once 03/05/18 0712 03/05/18 0837      Assessment/Plan: s/p Procedure(s): IRRIGATION AND DEBRIDEMENT FOOT-LEFT HEEL (Left) Assessment: Osteomyelitis left calcaneus   Plan: Bulky dry dressing change applied to the ulceration on the left foot.  Spoke with Dr. Henrene Hawking with anesthesia who stated that we would need to postpone his surgery due to his bowel obstruction.  At this point we will plan for daily dressing changes on the left foot until debridement can be performed.  Hopefully patient will be stabilized within the next 1 to 2 days to proceed with this.  LOS: 2 days    Ricci Barker 03/07/2018

## 2018-03-08 ENCOUNTER — Inpatient Hospital Stay: Payer: Medicare Other | Admitting: Anesthesiology

## 2018-03-08 ENCOUNTER — Encounter
Admission: EM | Disposition: A | Discharge status: Skilled Nursing Facility | Payer: Self-pay | Source: Home / Self Care | Attending: Internal Medicine

## 2018-03-08 ENCOUNTER — Inpatient Hospital Stay: Payer: Medicare Other

## 2018-03-08 HISTORY — PX: APPLICATION OF WOUND VAC: SHX5189

## 2018-03-08 HISTORY — PX: IRRIGATION AND DEBRIDEMENT FOOT: SHX6602

## 2018-03-08 LAB — TSH: TSH: 2.591 u[IU]/mL (ref 0.350–4.500)

## 2018-03-08 LAB — HEPATIC FUNCTION PANEL
ALK PHOS: 62 U/L (ref 38–126)
ALT: 32 U/L (ref 0–44)
AST: 69 U/L — ABNORMAL HIGH (ref 15–41)
Albumin: 2.7 g/dL — ABNORMAL LOW (ref 3.5–5.0)
TOTAL PROTEIN: 7.8 g/dL (ref 6.5–8.1)
Total Bilirubin: 0.8 mg/dL (ref 0.3–1.2)

## 2018-03-08 LAB — RENAL FUNCTION PANEL
ALBUMIN: 2.6 g/dL — AB (ref 3.5–5.0)
Anion gap: 8 (ref 5–15)
BUN: 28 mg/dL — ABNORMAL HIGH (ref 8–23)
CHLORIDE: 94 mmol/L — AB (ref 98–111)
CO2: 44 mmol/L — ABNORMAL HIGH (ref 22–32)
Calcium: 8.2 mg/dL — ABNORMAL LOW (ref 8.9–10.3)
Creatinine, Ser: 1.59 mg/dL — ABNORMAL HIGH (ref 0.61–1.24)
GFR calc non Af Amer: 43 mL/min — ABNORMAL LOW (ref >60)
GFR, EST AFRICAN AMERICAN: 50 mL/min — AB (ref >60)
Glucose, Bld: 105 mg/dL — ABNORMAL HIGH (ref 70–99)
PHOSPHORUS: 2.8 mg/dL (ref 2.5–4.6)
POTASSIUM: 3.5 mmol/L (ref 3.5–5.1)
Sodium: 146 mmol/L — ABNORMAL HIGH (ref 135–145)

## 2018-03-08 LAB — GLUCOSE, CAPILLARY
GLUCOSE-CAPILLARY: 105 mg/dL — AB (ref 70–99)
GLUCOSE-CAPILLARY: 110 mg/dL — AB (ref 70–99)
Glucose-Capillary: 109 mg/dL — ABNORMAL HIGH (ref 70–99)
Glucose-Capillary: 94 mg/dL (ref 70–99)

## 2018-03-08 LAB — HEPARIN LEVEL (UNFRACTIONATED)
Heparin Unfractionated: 0.79 IU/mL — ABNORMAL HIGH (ref 0.30–0.70)
Heparin Unfractionated: 0.74 IU/mL — ABNORMAL HIGH (ref 0.30–0.70)

## 2018-03-08 LAB — CBC WITH DIFFERENTIAL/PLATELET
Basophils Absolute: 0 10*3/uL (ref 0–0.1)
Basophils Relative: 0 %
EOS ABS: 0.1 10*3/uL (ref 0–0.7)
Eosinophils Relative: 1 %
HEMATOCRIT: 33.9 % — AB (ref 40.0–52.0)
HEMOGLOBIN: 11.1 g/dL — AB (ref 13.0–18.0)
LYMPHS ABS: 0.6 10*3/uL — AB (ref 1.0–3.6)
Lymphocytes Relative: 7 %
MCH: 28.4 pg (ref 26.0–34.0)
MCHC: 32.6 g/dL (ref 32.0–36.0)
MCV: 87.2 fL (ref 80.0–100.0)
MONOS PCT: 13 %
Monocytes Absolute: 1.1 10*3/uL — ABNORMAL HIGH (ref 0.2–1.0)
NEUTROS PCT: 79 %
Neutro Abs: 6.4 10*3/uL (ref 1.4–6.5)
Platelets: 227 10*3/uL (ref 150–440)
RBC: 3.89 MIL/uL — ABNORMAL LOW (ref 4.40–5.90)
RDW: 19.3 % — ABNORMAL HIGH (ref 11.5–14.5)
WBC: 8.2 10*3/uL (ref 3.8–10.6)

## 2018-03-08 LAB — PHOSPHORUS: Phosphorus: 2.9 mg/dL (ref 2.5–4.6)

## 2018-03-08 LAB — HEMOGLOBIN A1C
HEMOGLOBIN A1C: 7.3 % — AB (ref 4.8–5.6)
Mean Plasma Glucose: 162.81 mg/dL

## 2018-03-08 LAB — CK: CK TOTAL: 768 U/L — AB (ref 49–397)

## 2018-03-08 LAB — APTT: aPTT: 75 seconds — ABNORMAL HIGH (ref 24–36)

## 2018-03-08 LAB — MAGNESIUM: Magnesium: 2.6 mg/dL — ABNORMAL HIGH (ref 1.7–2.4)

## 2018-03-08 SURGERY — IRRIGATION AND DEBRIDEMENT FOOT
Anesthesia: Choice | Site: Foot | Laterality: Left | Wound class: "Dirty or Infected "

## 2018-03-08 MED ORDER — DEXMEDETOMIDINE HCL 200 MCG/2ML IV SOLN
INTRAVENOUS | Status: DC | PRN
  Administered 2018-03-08 (×3): 8 ug via INTRAVENOUS

## 2018-03-08 MED ORDER — IPRATROPIUM-ALBUTEROL 0.5-2.5 (3) MG/3ML IN SOLN
3.0000 mL | Freq: Four times a day (QID) | RESPIRATORY_TRACT | Status: DC
  Administered 2018-03-09 (×3): 3 mL via RESPIRATORY_TRACT
  Filled 2018-03-08 (×3): qty 3

## 2018-03-08 MED ORDER — DEXMEDETOMIDINE HCL IN NACL 400 MCG/100ML IV SOLN
INTRAVENOUS | Status: DC | PRN
  Administered 2018-03-08: .4 ug/kg/h via INTRAVENOUS

## 2018-03-08 MED ORDER — ONDANSETRON HCL 4 MG/2ML IJ SOLN: 4.0000 mg | Freq: Once | INTRAMUSCULAR | Status: DC | PRN

## 2018-03-08 MED ORDER — BUPIVACAINE HCL (PF) 0.5 % IJ SOLN
INTRAMUSCULAR | Status: AC
  Filled 2018-03-08: qty 30

## 2018-03-08 MED ORDER — GENTAMICIN SULFATE 40 MG/ML IJ SOLN
INTRAMUSCULAR | Status: DC | PRN
  Administered 2018-03-08: 240 mg

## 2018-03-08 MED ORDER — MIDAZOLAM HCL 2 MG/2ML IJ SOLN
INTRAMUSCULAR | Status: AC
  Filled 2018-03-08: qty 2

## 2018-03-08 MED ORDER — FENTANYL CITRATE (PF) 100 MCG/2ML IJ SOLN: 25.0000 ug | INTRAMUSCULAR | Status: DC | PRN

## 2018-03-08 MED ORDER — MIDAZOLAM HCL 2 MG/2ML IJ SOLN
INTRAMUSCULAR | Status: DC | PRN
  Administered 2018-03-08 (×2): 1 mg via INTRAVENOUS

## 2018-03-08 MED ORDER — ONDANSETRON HCL 4 MG/2ML IJ SOLN
INTRAMUSCULAR | Status: DC | PRN
  Administered 2018-03-08: 4 mg via INTRAVENOUS

## 2018-03-08 MED ORDER — FENTANYL CITRATE (PF) 100 MCG/2ML IJ SOLN
INTRAMUSCULAR | Status: AC
  Filled 2018-03-08: qty 2

## 2018-03-08 MED ORDER — VANCOMYCIN HCL 1000 MG IV SOLR
INTRAVENOUS | Status: DC | PRN
  Administered 2018-03-08: 1000 mg

## 2018-03-08 MED ORDER — HEPARIN BOLUS VIA INFUSION
1500.0000 [IU] | Freq: Once | INTRAVENOUS | Status: AC
  Administered 2018-03-08: 1500 [IU] via INTRAVENOUS
  Filled 2018-03-08: qty 1500

## 2018-03-08 MED ORDER — DEXMEDETOMIDINE HCL IN NACL 80 MCG/20ML IV SOLN
INTRAVENOUS | Status: AC
  Filled 2018-03-08: qty 40

## 2018-03-08 MED ORDER — GENTAMICIN SULFATE 40 MG/ML IJ SOLN
INTRAMUSCULAR | Status: AC
Start: 2018-03-08 — End: ?
  Filled 2018-03-08: qty 4

## 2018-03-08 MED ORDER — NEOMYCIN-POLYMYXIN B GU 40-200000 IR SOLN
Status: AC
  Filled 2018-03-08: qty 2

## 2018-03-08 MED ORDER — VANCOMYCIN HCL 1000 MG IV SOLR
INTRAVENOUS | Status: AC
  Filled 2018-03-08: qty 1000

## 2018-03-08 MED ORDER — FENTANYL CITRATE (PF) 100 MCG/2ML IJ SOLN
INTRAMUSCULAR | Status: DC | PRN
  Administered 2018-03-08 (×4): 25 ug via INTRAVENOUS

## 2018-03-08 MED ORDER — ONDANSETRON HCL 4 MG/2ML IJ SOLN
INTRAMUSCULAR | Status: AC
  Filled 2018-03-08: qty 2

## 2018-03-08 SURGICAL SUPPLY — 57 items
"PENCIL ELECTRO HAND CTR " (MISCELLANEOUS) ×1 IMPLANT
BANDAGE ELASTIC 4 LF NS (GAUZE/BANDAGES/DRESSINGS) ×3 IMPLANT
BLADE OSCILLATING/SAGITTAL (BLADE)
BLADE SURG 15 STRL LF DISP TIS (BLADE) ×1 IMPLANT
BLADE SURG 15 STRL SS (BLADE) ×2
BLADE SW THK.38XMED LNG THN (BLADE) IMPLANT
BNDG ESMARK 4X12 TAN STRL LF (GAUZE/BANDAGES/DRESSINGS) ×3 IMPLANT
BNDG GAUZE 4.5X4.1 6PLY STRL (MISCELLANEOUS) ×4 IMPLANT
CANISTER SUCT 1200ML W/VALVE (MISCELLANEOUS) ×3 IMPLANT
CUFF TOURN 18 STER (MISCELLANEOUS) ×1 IMPLANT
CUFF TOURN 30 STER DUAL PORT (MISCELLANEOUS) ×2 IMPLANT
CUFF TOURN DUAL PL 12 NO SLV (MISCELLANEOUS) ×1 IMPLANT
DRAPE FLUOR MINI C-ARM 54X84 (DRAPES) IMPLANT
DRESSING SURGICEL FIBRLLR 1X2 (HEMOSTASIS) IMPLANT
DRSG MEPITEL 4X7.2 (GAUZE/BANDAGES/DRESSINGS) ×2 IMPLANT
DRSG SURGICEL FIBRILLAR 1X2 (HEMOSTASIS) ×6
DRSG VAC ATS MED SENSATRAC (GAUZE/BANDAGES/DRESSINGS) ×2 IMPLANT
DURAPREP 26ML APPLICATOR (WOUND CARE) ×3 IMPLANT
ELECT REM PT RETURN 9FT ADLT (ELECTROSURGICAL) ×3
ELECTRODE REM PT RTRN 9FT ADLT (ELECTROSURGICAL) ×1 IMPLANT
GAUZE PETRO XEROFOAM 1X8 (MISCELLANEOUS) ×3 IMPLANT
GAUZE SPONGE 4X4 12PLY STRL (GAUZE/BANDAGES/DRESSINGS) ×3 IMPLANT
GAUZE STRETCH 2X75IN STRL (MISCELLANEOUS) ×3 IMPLANT
GLOVE BIO SURGEON STRL SZ7.5 (GLOVE) ×5 IMPLANT
GLOVE BIO SURGEON STRL SZ8 (GLOVE) ×2 IMPLANT
GLOVE INDICATOR 8.0 STRL GRN (GLOVE) ×3 IMPLANT
GOWN STRL REUS W/ TWL LRG LVL3 (GOWN DISPOSABLE) ×2 IMPLANT
GOWN STRL REUS W/TWL LRG LVL3 (GOWN DISPOSABLE) ×4
HANDPIECE VERSAJET DEBRIDEMENT (MISCELLANEOUS) ×3 IMPLANT
KIT STIMULAN RAPID CURE 5CC (Orthopedic Implant) ×2 IMPLANT
LABEL OR SOLS (LABEL) ×3 IMPLANT
NDL FILTER BLUNT 18X1 1/2 (NEEDLE) ×1 IMPLANT
NDL HYPO 25X1 1.5 SAFETY (NEEDLE) ×3 IMPLANT
NEEDLE FILTER BLUNT 18X 1/2SAF (NEEDLE) ×2
NEEDLE FILTER BLUNT 18X1 1/2 (NEEDLE) ×1 IMPLANT
NEEDLE HYPO 25X1 1.5 SAFETY (NEEDLE) ×9 IMPLANT
NS IRRIG 500ML POUR BTL (IV SOLUTION) ×3 IMPLANT
PACK EXTREMITY ARMC (MISCELLANEOUS) ×3 IMPLANT
PAD ABD DERMACEA PRESS 5X9 (GAUZE/BANDAGES/DRESSINGS) ×4 IMPLANT
PENCIL ELECTRO HAND CTR (MISCELLANEOUS) ×3 IMPLANT
RASP SM TEAR CROSS CUT (RASP) IMPLANT
SOL PREP PVP 2OZ (MISCELLANEOUS) ×3
SOLUTION PREP PVP 2OZ (MISCELLANEOUS) ×1 IMPLANT
SPONGE TONSIL SM 30040 (SPONGE) ×2 IMPLANT
STAPLER SKIN PROX 35W (STAPLE) ×2 IMPLANT
STOCKINETTE 48X4 2 PLY STRL (GAUZE/BANDAGES/DRESSINGS) ×1 IMPLANT
STOCKINETTE STRL 4IN 9604848 (GAUZE/BANDAGES/DRESSINGS) IMPLANT
STOCKINETTE STRL 6IN 960660 (GAUZE/BANDAGES/DRESSINGS) ×3 IMPLANT
SUT ETHILON 4-0 (SUTURE) ×2
SUT ETHILON 4-0 FS2 18XMFL BLK (SUTURE) ×1
SUT VIC AB 3-0 SH 27 (SUTURE) ×2
SUT VIC AB 3-0 SH 27X BRD (SUTURE) ×1 IMPLANT
SUT VIC AB 4-0 FS2 27 (SUTURE) ×3 IMPLANT
SUTURE ETHLN 4-0 FS2 18XMF BLK (SUTURE) ×1 IMPLANT
SYR 10ML LL (SYRINGE) ×6 IMPLANT
SYR 3ML LL SCALE MARK (SYRINGE) ×3 IMPLANT
WND VAC CANISTER 500ML (MISCELLANEOUS) ×2 IMPLANT

## 2018-03-08 NOTE — Anesthesia Postprocedure Evaluation (Signed)
Anesthesia Post Note  Patient: Kevan NyJay Freda  Procedure(s) Performed: IRRIGATION AND DEBRIDEMENT FOOT- LEFT HEEL (Left Foot) APPLICATION OF WOUND VAC (Left Foot)  Patient location during evaluation: PACU Anesthesia Type: General Level of consciousness: awake and alert Pain management: pain level controlled Vital Signs Assessment: post-procedure vital signs reviewed and stable Respiratory status: spontaneous breathing, nonlabored ventilation, respiratory function stable and patient connected to nasal cannula oxygen Cardiovascular status: blood pressure returned to baseline and stable Postop Assessment: no apparent nausea or vomiting Anesthetic complications: no     Last Vitals:  Vitals:   03/08/18 2001 03/08/18 2007  BP:  (!) 160/68  Pulse: 69 (!) 55  Resp: 16 18  Temp:  36.8 C  SpO2: 90% 100%    Last Pain:  Vitals:   03/08/18 2007  TempSrc: Oral  PainSc:                  Yevette EdwardsJames G Adams

## 2018-03-08 NOTE — Progress Notes (Signed)
[facility] [place] NAME: Alan Dillon    MR#:  [id number]  DATE OF BIRTH:  Dec 18, 1950  SUBJECTIVE:   Patient was transferred back out of the ICU yesterday.  Still has NG tube and had significant drainage overnight but improved this morning.  Patient denies any nausea, is passing flatus and denies any abdominal pain.  Plan is for possible surgical debridement of his left diabetic foot ulcer/osteomyelitis later today.  REVIEW OF SYSTEMS:    Review of Systems  Constitutional: Negative for chills and fever.  HENT: Negative for congestion and tinnitus.   Eyes: Negative for blurred vision and double vision.  Respiratory: Positive for shortness of breath. Negative for cough and wheezing.   Cardiovascular: Negative for chest pain, orthopnea and PND.  Gastrointestinal: Negative for abdominal pain, diarrhea, nausea and vomiting.  Genitourinary: Negative for dysuria and hematuria.  Neurological: Positive for weakness. Negative for dizziness, sensory change and focal weakness.  All other systems reviewed and are negative.   Nutrition: NPO  Tolerating Diet: No Tolerating PT: Await Eval.   DRUG ALLERGIES:   Allergies  Allergen Reactions  . Bactrim [Sulfamethoxazole-Trimethoprim] Other (See Comments)    Caused kidney failure    VITALS:  Blood pressure (!) 158/84, pulse 90, temperature 98.2 F (36.8 C), temperature source Oral, resp. rate 12, height _0  (1.854 m), weight (!) 161.8 kg (356 lb 11.2 oz), SpO2 97 %.  PHYSICAL EXAMINATION:   Physical Exam  GENERAL:  67 y.o.-year-old morbidly obese patient lying in bed in NAD.  EYES: Pupils equal, round, reactive to light and accommodation. No scleral icterus. Extraocular muscles intact.  HEENT: Head atraumatic, normocephalic. NG tube in place some brown color fluid draining  NECK:  Supple, no jugular venous distention. No thyroid enlargement, no tenderness.  LUNGS: Normal breath sounds  bilaterally, no wheezing, rales, rhonchi. No use of accessory muscles of respiration.  CARDIOVASCULAR: S1, S2 normal. No murmurs, rubs, or gallops.  ABDOMEN: Soft, nontender, protuberant, nondistended. Bowel sounds present. No organomegaly or mass.  EXTREMITIES: No cyanosis, clubbing, +1-2 edema from the knees and ankles bilaterally. NEUROLOGIC: Cranial nerves II through XII are intact.  Focal motor or sensory deficits appreciated bilaterally, globally weak. PSYCHIATRIC: The patient is alert and oriented x 3.  SKIN: No obvious rash, lesion, or ulcer.  Left foot plantar diabetic foot ulcer with underlying osteomyelitis covered in Kerlix wrap. Signs of chronic venous stasis b/l.    LABORATORY PANEL:   CBC Recent Labs  Lab 03/07/18 0321  WBC 8.2  HGB 11.1*  HCT 33.9*  PLT 227   ------------------------------------------------------------------------------------------------------------------  Chemistries  Recent Labs  Lab 03/07/18 0321  03/08/18 0519  NA  --    < > 146*  K  --    < > 3.5  CL  --    < > 94*  CO2  --    < > 44*  GLUCOSE  --    < > 105*  BUN  --    < > 28*  CREATININE  --    < > 1.59*  CALCIUM  --    < > 8.2*  MG 2.6*  --   --   AST 69*  --   --   ALT 32  --   --   ALKPHOS 62  --   --   BILITOT 0.8  --   --    < > = values in this interval not displayed.   ------------------------------------------------------------------------------------------------------------------  Cardiac Enzymes Recent Labs  Lab 03/05/18 0646  TROPONINI 0.03*   ------------------------------------------------------------------------------------------------------------------  RADIOLOGY:  Dg Chest 1 View  Result Date: 03/07/2018 CLINICAL DATA:  Shortness of breath. EXAM: CHEST  1 VIEW COMPARISON:  03/05/2018 FINDINGS: The heart is enlarged but stable. There is tortuosity and calcification of the thoracic aorta. Bibasilar infiltrates are noted. No definite pleural effusions.  Vascular congestion without overt pulmonary edema. The bony structures are grossly intact. Advanced degenerative changes involving the right shoulder. IMPRESSION: Bibasilar infiltrates. Vascular congestion without overt pulmonary edema or definite pleural effusions. Electronically Signed   By: Marijo Sanes M.D.   On: 03/07/2018 07:54   Dg Abd 1 View  Result Date: 03/08/2018 CLINICAL DATA:  Ileus  Follow up , SBO EXAM: ABDOMEN - 1 VIEW COMPARISON:  [id number] FINDINGS: Eighteen No dilated loops of large or small bowel. Gas expanded stomach. No pathologic calcifications. No organomegaly. No aggressive osseous lesion. No intraperitoneal free air. IMPRESSION: No acute abdominal findings. Electronically Signed   By: Suzy Bouchard M.D.   On: 03/08/2018 12:19   Dg Abd 1 View  Result Date: 03/06/2018 CLINICAL DATA:  67 year old male post nasogastric tube placement. Initial encounter. EXAM: ABDOMEN - 1 VIEW COMPARISON:  [id number] p.m. FINDINGS: Nasogastric tube placed with tip and side hole gastric fundus level. Gas distended stomach. IMPRESSION: Nasogastric tube tip and side hole gastric fundus level. Gas distended stomach. Electronically Signed   By: Genia Del M.D.   On: 03/06/2018 15:35   Dg Abd 1 View  Result Date: 03/06/2018 CLINICAL DATA:  Nausea and vomiting. EXAM: ABDOMEN - 1 VIEW COMPARISON:  CT abdomen pelvis from yesterday. FINDINGS: Gaseous distention of the stomach. The bowel gas pattern is normal. No radio-opaque calculi or other significant radiographic abnormality are seen. No acute osseous abnormality. IMPRESSION: 1. Gaseous distention of the stomach.  No small bowel obstruction. Electronically Signed   By: Titus Dubin M.D.   On: 03/06/2018 15:27   US Venous Img Lower Bilateral  Result Date: 03/07/2018 CLINICAL DATA:  Bilateral lower extremity edema. History of bladder cancer. Evaluate for DVT. EXAM: BILATERAL LOWER EXTREMITY VENOUS DOPPLER ULTRASOUND TECHNIQUE: Gray-scale sonography  with graded compression, as well as color Doppler and duplex ultrasound were performed to evaluate the lower extremity deep venous systems from the level of the common femoral vein and including the common femoral, femoral, profunda femoral, popliteal and calf veins including the posterior tibial, peroneal and gastrocnemius veins when visible. The superficial great saphenous vein was also interrogated. Spectral Doppler was utilized to evaluate flow at rest and with distal augmentation maneuvers in the common femoral, femoral and popliteal veins. COMPARISON:  None. FINDINGS: RIGHT LOWER EXTREMITY Common Femoral Vein: No evidence of thrombus. Normal compressibility, respiratory phasicity and response to augmentation. Saphenofemoral Junction: No evidence of thrombus. Normal compressibility and flow on color Doppler imaging. Profunda Femoral Vein: No evidence of thrombus. Normal compressibility and flow on color Doppler imaging. Femoral Vein: No evidence of thrombus. Normal compressibility, respiratory phasicity and response to augmentation. Popliteal Vein: No evidence of thrombus. Normal compressibility, respiratory phasicity and response to augmentation. Calf Veins: Appear patent where imaged Superficial Great Saphenous Vein: No evidence of thrombus. Normal compressibility. Venous Reflux:  None. Other Findings:  None. LEFT LOWER EXTREMITY Common Femoral Vein: No evidence of thrombus. Normal compressibility, respiratory phasicity and response to augmentation. Saphenofemoral Junction: No evidence of thrombus. Normal compressibility and flow on color Doppler imaging. Profunda Femoral Vein: No evidence of thrombus. Normal compressibility and flow on color Doppler  imaging. Femoral Vein: No evidence of thrombus. Normal compressibility, respiratory phasicity and response to augmentation. Popliteal Vein: No evidence of thrombus. Normal compressibility, respiratory phasicity and response to augmentation. Calf Veins: Appear  patent where imaged. Superficial Great Saphenous Vein: No evidence of thrombus. Normal compressibility. Venous Reflux:  None. Other Findings:  None. IMPRESSION: No evidence of DVT within either lower extremity. Electronically Signed   By: Sandi Mariscal M.D.   On: 03/07/2018 15:30     ASSESSMENT AND PLAN:   67 year old male with past medical history of morbid obesity, diabetes, obstructive sleep apnea, hypertension, chronic kidney disease stage III, chronic atrial fibrillation who presented to the hospital due to generalized weakness, lethargy and noted to have sepsis secondary to left foot osteomyelitis.  1.  Sepsis- patient met criteria given his leukocytosis, hypotension, leukocytosis and noted to have infected left foot diabetic ulcer with osteomyelitis. -Blood cultures are positive for E. coli and it is not ESBL.  Cont. IV ancef for now.   2.  Acute on chronic respiratory failure with hypercapnia- secondary to underlying obstructive sleep apnea, obesity pickwickian syndrome. -Patient developed worsening hypercapnia and was transferred to the ICU, placed on BiPAP and hypercapnia has improved.  Patient's mental status is also improved.  Patient off BiPAP now.  3.  Ileus- patient was having persistent nausea vomiting 2 days ago.  Had a KUB/abdominal x-ray which was suggestive of gaseous distention.   -Patient had NG tube for the past 24 to 48 hours and had significant drainage from it.  It has improved this morning.  A repeat KUB obtained which showed no ileus/gaseous distention or obstruction.  Patient has no nausea and denies any abdominal pain and is passing flatus.  Will remove NG tube.  Seen by general surgery and they have ordered a upper GI series for tomorrow. -Continue supportive care.  4.  Left foot osteomyelitis- patient was supposed to have surgical debridement yesterday but it was canceled due to patient's GI issues/ileus.  Discussed with Dr. Cleda Mccreedy and plan for surgical debridement  later today as patient's ileus is improved.  5.  Chronic diastolic CHF- clinically not in CHF presently. - pt. Coreg, Bumex were held due to low BP  6. CKD stage III- seen by nephrology, creatinine close to baseline.  Seen by Nephro and cont.  - cont. Gentle IV fluids for now as per Nephro.  -Renal ultrasound showing no evidence of obstruction or hydronephrosis.  7.  Diabetes type 2 without complication-continue sliding scale insulin.  Blood sugar stable.  8.  Hyperlipidemia-continue atorvastatin.  9. Chronic A. Fib - rate controlled. Will resume Coreg, Eliquis  - hold Eliquis. Hold Heparin gtt for now as pt. To have surgery later today.    All the records are reviewed and case discussed with Care Management/Social Worker. Management plans discussed with the patient, family and they are in agreement.  CODE STATUS: Full code  DVT Prophylaxis: Heparin gtt  TOTAL Critical Care TIME TAKING CARE OF THIS PATIENT: 30 minutes.   POSSIBLE D/C unclear , DEPENDING ON CLINICAL CONDITION.   Henreitta Leber M.D on 03/08/2018 at 3:05 PM  Between 7am to 6pm - Pager - [phone]  After 6pm go to [email or web address] - Proofreader  [facility]  Office  [phone]  CC: Primary care physician; Neta Ehlers, MD

## 2018-03-08 NOTE — Care Management Important Message (Signed)
Copy of signed IM left with patient in room.  

## 2018-03-08 NOTE — Progress Notes (Signed)
ANTICOAGULATION CONSULT NOTE  Pharmacy Consult for heparin Indication: atrial fibrillation  Allergies  Allergen Reactions  . Bactrim [Sulfamethoxazole-Trimethoprim] Other (See Comments)    Caused kidney failure    Patient Measurements: Height: 6\' 1"  (185.4 cm) Weight: (!) 364 lb 3.2 oz (165.2 kg) IBW/kg (Calculated) : 79.9 Heparin Dosing Weight: 119.9 kg  Vital Signs: Temp: 99.3 F (37.4 C) (07/16 1925) Temp Source: Oral (07/16 1925) BP: 160/85 (07/16 1925) Pulse Rate: 78 (07/16 2345)  Labs: Recent Labs    03/05/18 0646 03/05/18 1336  03/06/18 0559  03/07/18 0554 03/07/18 1605 03/07/18 1957  HGB 12.6*  --   --  11.3*  --   --   --   --   HCT 38.9*  --   --  34.6*  --   --   --   --   PLT 272  --   --  205  --   --   --   --   APTT  --  35   < > 49*   < > 70* 48* 55*  LABPROT 17.2*  --   --   --   --   --   --   --   INR 1.42  --   --   --   --   --   --   --   HEPARINUNFRC  --  1.61*  --  1.09*  --  1.16*  --   --   CREATININE 1.86*  --   --  1.95*  --  1.68*  --   --   TROPONINI 0.03*  --   --   --   --   --   --   --    < > = values in this interval not displayed.    Estimated Creatinine Clearance: 68.8 mL/min (A) (by C-G formula based on SCr of 1.68 mg/dL (H)).   Medical History: Past Medical History:  Diagnosis Date  . Atrial fibrillation, chronic (HCC)    On apixaban  . CKD (chronic kidney disease), stage III (HCC)   . Diabetes mellitus type 2 in obese (HCC)   . 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 History of hemodialysis    Bactrim mediated  Acute renal failure  . Hypertension   . Morbid obesity with BMI of 45.0-49.9, adult (HCC)   . Osteomyelitis of ankle or foot, acute, left (HCC)    chronic    Medications:  Infusions:  . sodium chloride 75 mL/hr at 03/07/18 2036  .  ceFAZolin (ANCEF) IV 2 g (03/07/18 2259)  . heparin 2,250 Units/hr (03/08/18 0118)    67 yom cc weakness with PMH  DM, AF on Eliquis PTA, CHF, HTN. Per RN patient and wife are both unable to provide detailed information regarding last Eliquis dose. Pharmacy consulted to dose heparin for AF. Baseline aPTT, PT/INR, and heparin level are ordered - Eliquis has been discontinued. Heparin currently infusing at 2250 units/hr.    Goal of Therapy:  Heparin level 0.3-0.7 units/ml aPTT 66 to 102 seconds Monitor platelets by anticoagulation protocol: Yes   Assessment/Plan:  07/16 @ 2000 aPTT 55 subtherapeutic. Will bolus heparin 1500 units IV x 1 and increase rate to 2350 units/hr and will recheck next aPTT/HL at 1000. Will monitor CBC w/ am labs.  Thomasene Rippleavid Styles Fambro, PharmD, BCPS Clinical Pharmacist 03/08/2018

## 2018-03-08 NOTE — Interval H&P Note (Signed)
History and Physical Interval Note:  03/08/2018 5:06 PM  Alan Dillon  has presented today for surgery, with the diagnosis of OSTEOMYELITIS  The various methods of treatment have been discussed with the patient and family. After consideration of risks, benefits and other options for treatment, the patient has consented to  Procedure(s): IRRIGATION AND DEBRIDEMENT FOOT- LEFT HEEL (Left) APPLICATION OF WOUND VAC (Left) as a surgical intervention .  The patient's history has been reviewed, patient examined, no change in status, stable for surgery.  I have reviewed the patient's chart and labs.  Questions were answered to the patient's satisfaction.     Ricci Barkerodd W Amaar Oshita

## 2018-03-08 NOTE — Clinical Social Work Note (Signed)
CSW was asked by patient and his wife about FMLA paperwork.  CSW informed patient and his wife that the PCP would be the one that would have to sign off on FMLA paperwork.  CSW to sign off, please reconsult if social work needs arise.  Ervin KnackEric R. Nelsie Domino, MSW, Theresia MajorsLCSWA [phone]  03/08/2018 10:32 AM

## 2018-03-08 NOTE — Progress Notes (Signed)
Pt refused BiPAP mask, pt stated "I did alright night last night with out it". Pt encouraged to call nursing if he changes his mind and we can put it on for him.

## 2018-03-08 NOTE — Plan of Care (Signed)
  Problem: Clinical Measurements: Goal: Diagnostic test results will improve Outcome: Progressing   

## 2018-03-08 NOTE — Anesthesia Preprocedure Evaluation (Signed)
Anesthesia Evaluation  Patient identified by MRN, date of birth, ID band Patient awake    Reviewed: Allergy & Precautions, H&P , NPO status , Patient's Chart, lab work & pertinent test results, reviewed documented beta blocker date and time   Airway Mallampati: II   Neck ROM: full    Dental  (+) Poor Dentition   Pulmonary neg pulmonary ROS,    Pulmonary exam normal        Cardiovascular hypertension, On Medications negative cardio ROS Normal cardiovascular examAtrial Fibrillation  Rhythm:regular Rate:Normal     Neuro/Psych negative neurological ROS  negative psych ROS   GI/Hepatic negative GI ROS, Neg liver ROS,   Endo/Other  negative endocrine ROSdiabetes, Well Controlled, Type 2  Renal/GU CRFRenal diseasenegative Renal ROS  negative genitourinary   Musculoskeletal   Abdominal   Peds  Hematology negative hematology ROS (+)   Anesthesia Other Findings Past Medical History: No date: Atrial fibrillation, chronic (HCC)     Comment:  On apixaban No date: CKD (chronic kidney disease), stage III (HCC) No date: Diabetes mellitus type 2 in obese (HCC) No date: 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)     Comment:  L Ankle - chronic No date: History of hemodialysis     Comment:  Bactrim mediated  Acute renal failure No date: Hypertension No date: Morbid obesity with BMI of 45.0-49.9, adult (HCC) No date: Osteomyelitis of ankle or foot, acute, left (HCC)     Comment:  chronic Past Surgical History: No date: DEBRIDEMENT  FOOT; Left BMI    Body Mass Index:  47.06 kg/m     Reproductive/Obstetrics negative OB ROS                             Anesthesia Physical Anesthesia Plan  ASA: III  Anesthesia Plan: General   Post-op Pain Management:    Induction:   PONV Risk Score and Plan:   Airway Management Planned:   Additional Equipment:   Intra-op  Plan:   Post-operative Plan:   Informed Consent: I have reviewed the patients History and Physical, chart, labs and discussed the procedure including the risks, benefits and alternatives for the proposed anesthesia with the patient or authorized representative who has indicated his/her understanding and acceptance.   Dental Advisory Given  Plan Discussed with: CRNA  Anesthesia Plan Comments:         Anesthesia Quick Evaluation

## 2018-03-08 NOTE — Op Note (Signed)
Date of operation: 03/08/2018.  Surgeon: Durward Fortes D.P.M.  Preoperative diagnosis: Osteomyelitis left heel.  Postoperative diagnosis: Same.  Procedure: I&D left heel with debridement of infected bone.  Anesthesia: MAC.  Hemostasis: Pneumatic tourniquet left ankle 300 mmHg.  Estimated blood loss: 10 to 15 cc.  Pathology: Bone left calcaneus.  Cultures: Bone culture left calcaneus.  Implants: Stimulan rapid cure antibiotic beads impregnated with vancomycin and gentamicin.  Fibrillar for hemostasis.  Complications: None apparent.  Operative indications: This is a 67 year old male with a chronic history of ulceration on his left heel.  He has had previous debridements.  Recently admitted to the hospital with sepsis with osteomyelitis of the left heel determined by MRI.  Decision was made for debridement of the heel once the patient was medically stable to remove the infected bone.  Operative procedure: Patient was taken to the operating room and placed on the table in the supine position.  Following MAC anesthesia a pneumatic tourniquet was applied at the level of the left lower leg and the foot was prepped and draped in the usual sterile fashion.  The foot was exsanguinated and the tourniquet inflated to 250 mmHg.     Attention was then directed to the plantar aspect of the left foot where the large ulceration was noted.  An approximate 8 cm linear incision was made through the plantar aspect of the wound and extending onto the posterior aspect of the heel.  The incision was carried sharply down to the level of bone.  Using a key and Cobb elevator the soft tissue was elevated off of the inferior and medial and lateral aspect of the calcaneus.  Using straight and curved osteotomes the inferior aspect of the heel was resected and removed.  Using a large bone curette the anterior aspect of the calcaneus was curetted out down into the lateral aspect where the infection was showed to be on MRI.   Curettement was taken down to good healthy bone.  The wound was then thoroughly debrided using a versa jet on a setting of 5 among all the soft tissues and the bone area within the wound as well as the more superficial ulceration on the plantar aspect of the foot.  Wound was then flushed with copious amounts of sterile saline using a bulb syringe.  The proximal and distal aspects of the incision were closed using 3-0 nylon vertical mattress and simple interrupted sutures.  Fibrillar was then implanted into the wound along the bone surface areas to control bleeding and for hemostasis.  Stimulan rapid cure antibiotic beads impregnated with vancomycin and gentamicin were then implanted into the wound.  The wound was then covered with Mepitel and staples to hold beads intact.  A wound VAC was then applied to the left heel and wound area and was noted to have a good seal.  Tourniquet was released.  ABDs Kerlix and an Ace wrap applied to the left lower extremity.  Patient was awakened and transported to the PACU with vital signs stable and in good condition.

## 2018-03-08 NOTE — Progress Notes (Signed)
Patient ID: Alan Dillon, male   DOB: 1950/09/17, 67 y.o.   MRN: [id number]     SURGICAL PROGRESS NOTE   Hospital Day(s): 3.   Post op day(s):  Marland Kitchen.   Interval History: Patient seen and examined, no acute events or new complaints overnight. Patient reports passing gas, denies nausea or vomiting since NGT was placed. Denies abdominal pain.  Vital signs in last 24 hours: [min-max] current  Temp:  [97.6 F (36.4 C)-99.3 F (37.4 C)] 98.2 F (36.8 C) (07/17 0753) Pulse Rate:  [78-90] 90 (07/17 0753) Resp:  [12-25] 12 (07/17 0441) BP: (128-176)/(61-92) 158/84 (07/17 0753) SpO2:  [90 %-98 %] 97 % (07/17 0813) FiO2 (%):  [40 %] 40 % (07/17 0204) Weight:  [161.8 kg (356 lb 11.2 oz)] 161.8 kg (356 lb 11.2 oz) (07/17 0433)     Height: 6\' 1"  (185.4 cm) Weight: (!) 161.8 kg (356 lb 11.2 oz) BMI (Calculated): 47.07   Intake/Output this shift:  NGT output in last 24 hours: 3200 mL NGT output today: 750 mL  Physical Exam:  Constitutional: alert, cooperative and no distress  Respiratory: breathing non-labored at rest  Cardiovascular: regular rate and sinus rhythm  Gastrointestinal: soft, non-tender, and non-distended. Morbid obese  Labs:  CBC Latest Ref Rng & Units 03/07/2018 03/06/2018 03/05/2018  WBC 3.8 - 10.6 K/uL 8.2 12.5(H) 21.3(H)  Hemoglobin 13.0 - 18.0 g/dL 11.1(L) 11.3(L) 12.6(L)  Hematocrit 40.0 - 52.0 % 33.9(L) 34.6(L) 38.9(L)  Platelets 150 - 440 K/uL 227 205 272   CMP Latest Ref Rng & Units 03/07/2018 03/06/2018 03/05/2018  Glucose 70 - 99 mg/dL 409(W112(H) 119(J148(H) 478(G173(H)  BUN 8 - 23 mg/dL 95(A31(H) 21(H33(H) 08(M29(H)  Creatinine 0.61 - 1.24 mg/dL 5.78(I1.68(H) 6.96(E1.95(H) 9.52(W1.86(H)  Sodium 135 - 145 mmol/L 148(H) 142 141  Potassium 3.5 - 5.1 mmol/L 3.5 3.6 3.9  Chloride 98 - 111 mmol/L 99 99 101  CO2 22 - 32 mmol/L 40(H) 32 28  Calcium 8.9 - 10.3 mg/dL 8.1(L) 7.8(L) 8.4(L)  Total Protein 6.5 - 8.1 g/dL 7.8 - 8.3(H)  Total Bilirubin 0.3 - 1.2 mg/dL 0.8 - 0.7  Alkaline Phos 38 - 126 U/L 62 - 81  AST 15 - 41  U/L 69(H) - 33  ALT 0 - 44 U/L 32 - 18    Imaging studies: Abdominal Xray personally reviewed and found with significant dilation of the stomach. No small bowel or large bowel dilation. No air fluid levels. No free air.   Assessment/Plan: 67 y.o. male with gastric dilation, persistent with significant NGT output. Even though patient has improved symptoms I consider that the gastric dilation is significant and the NGT output is high. Differential again, gastric outlet obstruction vs gastroparesis, gastroenteritis. This may be chronic due to patient multiple medica condition but since we don't have previous images, it is not able to be compared. My recommendations is to evaluate the gastric function with Upper GI series.   Gae GallopEdgardo Cintrn-Daz, MD

## 2018-03-08 NOTE — Progress Notes (Signed)
Bipap on standby, PT is on 2L [place].

## 2018-03-08 NOTE — Anesthesia Post-op Follow-up Note (Signed)
Anesthesia QCDR form completed.        

## 2018-03-08 NOTE — Progress Notes (Signed)
[facility]  ROUNDING NOTE   Subjective:   Wife at bedside. Transferred to 2A.   NGT - 3200  1/2NS at 4175mL/hr  Objective:  Vital signs in last 24 hours:  Temp:  [97.6 F (36.4 C)-99.3 F (37.4 C)] 98.2 F (36.8 C) (07/17 0753) Pulse Rate:  [78-90] 90 (07/17 0753) Resp:  [12-25] 12 (07/17 0441) BP: (128-176)/(61-92) 158/84 (07/17 0753) SpO2:  [90 %-98 %] 97 % (07/17 0813) FiO2 (%):  [40 %] 40 % (07/17 0204) Weight:  [161.8 kg (356 lb 11.2 oz)] 161.8 kg (356 lb 11.2 oz) (07/17 0433)  Weight change: -1.002 kg (-2 lb 3.3 oz) Filed Weights   03/06/18 1244 03/07/18 0439 03/08/18 0433  Weight: (!) 162.8 kg (358 lb 14.5 oz) (!) 165.2 kg (364 lb 3.2 oz) (!) 161.8 kg (356 lb 11.2 oz)    Intake/Output: I/O last 3 completed shifts: In: 2751.7 [I.V.:2251.7; IV Piggyback:500] Out: 7860 [Urine:3535; Emesis/NG output:4325]   Intake/Output this shift:  Total I/O In: -  Out: 750 [Emesis/NG output:750]  Physical Exam: General: NAD   Head: +NGT   Eyes: Anicteric, PERRL  Neck: Supple, trachea midline  Lungs:  Clear to auscultation  Heart: Regular rate and rhythm  Abdomen:  Obese, +bowel sounds  Extremities:  bilateral peripheral edema.  Neurologic: Nonfocal, moving all four extremities  Skin: No lesions        Basic Metabolic Panel: Recent Labs  Lab 03/05/18 0646 03/06/18 0559 03/07/18 0321 03/07/18 0554 03/08/18 0519  NA 141 142  --  148* 146*  K 3.9 3.6  --  3.5 3.5  CL 101 99  --  99 94*  CO2 28 32  --  40* 44*  GLUCOSE 173* 148*  --  112* 105*  BUN 29* 33*  --  31* 28*  CREATININE 1.86* 1.95*  --  1.68* 1.59*  CALCIUM 8.4* 7.8*  --  8.1* 8.2*  MG  --   --  2.6*  --   --   PHOS  --   --  2.9 3.0 2.8    Liver Function Tests: Recent Labs  Lab 03/05/18 0646 03/07/18 0321 03/07/18 0554 03/08/18 0519  AST 33 69*  --   --   ALT 18 32  --   --   ALKPHOS 81 62  --   --   BILITOT 0.7 0.8  --   --   PROT 8.3* 7.8  --   --   ALBUMIN 3.4* 2.7*  2.7* 2.6*   Recent Labs  Lab 03/05/18 0646  LIPASE 28   Recent Labs  Lab 03/07/18 0548  AMMONIA <9*    CBC: Recent Labs  Lab 03/05/18 0646 03/06/18 0559 03/07/18 0321  WBC 21.3* 12.5* 8.2  NEUTROABS 19.9*  --  6.4  HGB 12.6* 11.3* 11.1*  HCT 38.9* 34.6* 33.9*  MCV 87.0 86.8 87.2  PLT 272 205 227    Cardiac Enzymes: Recent Labs  Lab 03/05/18 0646 03/07/18 0321  CKTOTAL  --  768*  TROPONINI 0.03*  --     BNP: Invalid input(s): POCBNP  CBG: Recent Labs  Lab 03/07/18 1608 03/07/18 1720 03/07/18 2111 03/08/18 0751 03/08/18 1135  GLUCAP 109* 112* 98 109* 105*    Microbiology: Results for orders placed or performed during the hospital encounter of 03/05/18  Blood Culture (routine x 2)     Status: Abnormal   Collection Time: 03/05/18  6:47 AM  Result Value Ref Range Status   Specimen Description  Final    BLOOD RIGHT FOREARM Performed at [facility], [address] [place], [place] [address]    Special Requests   Final    BOTTLES DRAWN AEROBIC AND ANAEROBIC Blood Culture adequate volume Performed at [facility], [address] [place], [place] [address]    Culture  Setup Time   Final    GRAM NEGATIVE RODS IN BOTH AEROBIC AND ANAEROBIC BOTTLES CRITICAL RESULT CALLED TO, READ BACK BY AND VERIFIED WITH: SHEEMA HALLAJI @ 1611 ON 03/05/2018 BY CAF Performed at [facility], [address] [place], [place] [address]    Culture (A)  Final    ESCHERICHIA COLI SUSCEPTIBILITIES PERFORMED ON PREVIOUS CULTURE WITHIN THE LAST 5 DAYS. Performed at [facility], [address], [place], [place] [address]    Report Status 03/07/2018 FINAL  Final  Blood Culture (routine x 2)     Status: Abnormal   Collection Time: 03/05/18  6:49 AM  Result Value Ref Range Status   Specimen Description   Final    BLOOD RIGHT HAND Performed at [facility], [address] [place], [place] [address]    Special Requests   Final     BOTTLES DRAWN AEROBIC AND ANAEROBIC Blood Culture results may not be optimal due to an excessive volume of blood received in culture bottles Performed at [facility], [address] [place], [place] [address]    Culture  Setup Time   Final    IN BOTH AEROBIC AND ANAEROBIC BOTTLES GRAM NEGATIVE RODS CRITICAL RESULT CALLED TO, READ BACK BY AND VERIFIED WITH: SHEEMA HALLAJI @ 1611 ON 03/05/2018 BY CAF Performed at [facility], [address] [place], [place] [address]    Culture ESCHERICHIA COLI (A)  Final   Report Status 03/07/2018 FINAL  Final   Organism ID, Bacteria ESCHERICHIA COLI  Final      Susceptibility   Escherichia coli - MIC*    AMPICILLIN >=32 RESISTANT Resistant     CEFAZOLIN <=4 SENSITIVE Sensitive     CEFEPIME <=1 SENSITIVE Sensitive     CEFTAZIDIME <=1 SENSITIVE Sensitive     CEFTRIAXONE <=1 SENSITIVE Sensitive     CIPROFLOXACIN >=4 RESISTANT Resistant     GENTAMICIN <=1 SENSITIVE Sensitive     IMIPENEM <=0.25 SENSITIVE Sensitive     TRIMETH/SULFA >=320 RESISTANT Resistant     AMPICILLIN/SULBACTAM 16 INTERMEDIATE Intermediate     PIP/TAZO <=4 SENSITIVE Sensitive     Extended ESBL NEGATIVE Sensitive     * ESCHERICHIA COLI  Blood Culture ID Panel (Reflexed)     Status: Abnormal   Collection Time: 03/05/18  6:49 AM  Result Value Ref Range Status   Enterococcus species NOT DETECTED NOT DETECTED Final   Listeria monocytogenes NOT DETECTED NOT DETECTED Final   Staphylococcus species NOT DETECTED NOT DETECTED Final   Staphylococcus aureus NOT DETECTED NOT DETECTED Final   Streptococcus species NOT DETECTED NOT DETECTED Final   Streptococcus agalactiae NOT DETECTED NOT DETECTED Final   Streptococcus pneumoniae NOT DETECTED NOT DETECTED Final   Streptococcus pyogenes NOT DETECTED NOT DETECTED Final   Acinetobacter baumannii NOT DETECTED NOT DETECTED Final   Enterobacteriaceae species DETECTED (A) NOT DETECTED Final    Comment: Enterobacteriaceae  represent a large family of gram-negative bacteria, not a single organism. CRITICAL RESULT CALLED TO, READ BACK BY AND VERIFIED WITH: SHEEMA HALLAJI @ 1611 ON 03/05/2018 BY CAF    Enterobacter cloacae complex NOT DETECTED NOT DETECTED Final  Escherichia coli DETECTED (A) NOT DETECTED Final    Comment: CRITICAL RESULT CALLED TO, READ BACK BY AND VERIFIED WITH: SHEEMA HALLAJI @ 1611 ON 03/05/2018 BY CAF    Klebsiella oxytoca NOT DETECTED NOT DETECTED Final   Klebsiella pneumoniae NOT DETECTED NOT DETECTED Final   Proteus species NOT DETECTED NOT DETECTED Final   Serratia marcescens NOT DETECTED NOT DETECTED Final   Carbapenem resistance NOT DETECTED NOT DETECTED Final   Haemophilus influenzae NOT DETECTED NOT DETECTED Final   Neisseria meningitidis NOT DETECTED NOT DETECTED Final   Pseudomonas aeruginosa NOT DETECTED NOT DETECTED Final   Candida albicans NOT DETECTED NOT DETECTED Final   Candida glabrata NOT DETECTED NOT DETECTED Final   Candida krusei NOT DETECTED NOT DETECTED Final   Candida parapsilosis NOT DETECTED NOT DETECTED Final   Candida tropicalis NOT DETECTED NOT DETECTED Final    Comment: Performed at [facility], [address] [place], [place] [address]  Urine culture     Status: None   Collection Time: 03/05/18  7:19 AM  Result Value Ref Range Status   Specimen Description   Final    URINE, RANDOM Performed at [facility], [name], [place], [place] [address]    Special Requests   Final    NONE Performed at [facility], [address], [place], [place] [address]    Culture   Final    NO GROWTH Performed at [facility], [address] [place], [place] [address]    Report Status 03/06/2018 FINAL  Final  MRSA PCR Screening     Status: None   Collection Time: 03/06/18 12:58 PM  Result Value Ref Range Status   MRSA by PCR NEGATIVE NEGATIVE Final    Comment:        The GeneXpert MRSA Assay (FDA approved for NASAL  specimens only), is one component of a comprehensive MRSA colonization surveillance program. It is not intended to diagnose MRSA infection nor to guide or monitor treatment for MRSA infections. Performed at [facility] [address] [place], [place] [address]   Culture, blood (single) w Reflex to ID Panel     Status: None (Preliminary result)   Collection Time: 03/07/18  3:41 PM  Result Value Ref Range Status   Specimen Description BLOOD BLOOD LEFT HAND  Final   Special Requests   Final    BOTTLES DRAWN AEROBIC AND ANAEROBIC Blood Culture results may not be optimal due to an inadequate volume of blood received in culture bottles   Culture   Final    NO GROWTH < 24 HOURS Performed at [facility], [address] [place], [place] [address]    Report Status PENDING  Incomplete    Coagulation Studies: No results for input(s): LABPROT, INR in the last 72 hours.  Urinalysis: No results for input(s): COLORURINE, LABSPEC, PHURINE, GLUCOSEU, HGBUR, BILIRUBINUR, KETONESUR, PROTEINUR, UROBILINOGEN, NITRITE, LEUKOCYTESUR in the last 72 hours.  Invalid input(s): APPERANCEUR    Imaging: Dg Chest 1 View  Result Date: 03/07/2018 CLINICAL DATA:  Shortness of breath. EXAM: CHEST  1 VIEW COMPARISON:  03/05/2018 FINDINGS: The heart is enlarged but stable. There is tortuosity and calcification of the thoracic aorta. Bibasilar infiltrates are noted. No definite pleural effusions. Vascular congestion without overt pulmonary edema. The bony structures are grossly intact. Advanced degenerative changes involving the right shoulder. IMPRESSION: Bibasilar infiltrates. Vascular congestion without overt pulmonary edema or definite pleural effusions. Electronically Signed   By: Demetrius Charity.  Gallerani M.D.   On: 03/07/2018 07:54   Dg Abd 1 View  Result Date: 03/08/2018 CLINICAL DATA:  Ileus  Follow up , SBO EXAM: ABDOMEN - 1 VIEW COMPARISON:  [id number] FINDINGS: Eighteen No dilated loops of large  or small bowel. Gas expanded stomach. No pathologic calcifications. No organomegaly. No aggressive osseous lesion. No intraperitoneal free air. IMPRESSION: No acute abdominal findings. Electronically Signed   By: Genevive Bi M.D.   On: 03/08/2018 12:19   Dg Abd 1 View  Result Date: 03/06/2018 CLINICAL DATA:  67 year old male post nasogastric tube placement. Initial encounter. EXAM: ABDOMEN - 1 VIEW COMPARISON:  [id number] p.m. FINDINGS: Nasogastric tube placed with tip and side hole gastric fundus level. Gas distended stomach. IMPRESSION: Nasogastric tube tip and side hole gastric fundus level. Gas distended stomach. Electronically Signed   By: Lacy Duverney M.D.   On: 03/06/2018 15:35   Dg Abd 1 View  Result Date: 03/06/2018 CLINICAL DATA:  Nausea and vomiting. EXAM: ABDOMEN - 1 VIEW COMPARISON:  CT abdomen pelvis from yesterday. FINDINGS: Gaseous distention of the stomach. The bowel gas pattern is normal. No radio-opaque calculi or other significant radiographic abnormality are seen. No acute osseous abnormality. IMPRESSION: 1. Gaseous distention of the stomach.  No small bowel obstruction. Electronically Signed   By: Obie Dredge M.D.   On: 03/06/2018 15:27   US Venous Img Lower Bilateral  Result Date: 03/07/2018 CLINICAL DATA:  Bilateral lower extremity edema. History of bladder cancer. Evaluate for DVT. EXAM: BILATERAL LOWER EXTREMITY VENOUS DOPPLER ULTRASOUND TECHNIQUE: Gray-scale sonography with graded compression, as well as color Doppler and duplex ultrasound were performed to evaluate the lower extremity deep venous systems from the level of the common femoral vein and including the common femoral, femoral, profunda femoral, popliteal and calf veins including the posterior tibial, peroneal and gastrocnemius veins when visible. The superficial great saphenous vein was also interrogated. Spectral Doppler was utilized to evaluate flow at rest and with distal augmentation maneuvers in the  common femoral, femoral and popliteal veins. COMPARISON:  None. FINDINGS: RIGHT LOWER EXTREMITY Common Femoral Vein: No evidence of thrombus. Normal compressibility, respiratory phasicity and response to augmentation. Saphenofemoral Junction: No evidence of thrombus. Normal compressibility and flow on color Doppler imaging. Profunda Femoral Vein: No evidence of thrombus. Normal compressibility and flow on color Doppler imaging. Femoral Vein: No evidence of thrombus. Normal compressibility, respiratory phasicity and response to augmentation. Popliteal Vein: No evidence of thrombus. Normal compressibility, respiratory phasicity and response to augmentation. Calf Veins: Appear patent where imaged Superficial Great Saphenous Vein: No evidence of thrombus. Normal compressibility. Venous Reflux:  None. Other Findings:  None. LEFT LOWER EXTREMITY Common Femoral Vein: No evidence of thrombus. Normal compressibility, respiratory phasicity and response to augmentation. Saphenofemoral Junction: No evidence of thrombus. Normal compressibility and flow on color Doppler imaging. Profunda Femoral Vein: No evidence of thrombus. Normal compressibility and flow on color Doppler imaging. Femoral Vein: No evidence of thrombus. Normal compressibility, respiratory phasicity and response to augmentation. Popliteal Vein: No evidence of thrombus. Normal compressibility, respiratory phasicity and response to augmentation. Calf Veins: Appear patent where imaged. Superficial Great Saphenous Vein: No evidence of thrombus. Normal compressibility. Venous Reflux:  None. Other Findings:  None. IMPRESSION: No evidence of DVT within either lower extremity. Electronically Signed   By: Simonne Come M.D.   On: 03/07/2018 15:30     Medications:   . sodium chloride 75 mL/hr at 03/07/18 2036  .  ceFAZolin (ANCEF) IV Stopped (03/08/18 0827)  .  heparin 2,350 Units/hr (03/08/18 0156)   . albuterol  3 mL Inhalation BID  . brimonidine  1 drop Both  Eyes BID  . docusate sodium  100 mg Oral BID  . ferrous sulfate  325 mg Oral BID  . insulin aspart  0-15 Units Subcutaneous TID WC  . ipratropium  0.5 mg Nebulization Q6H  . mouth rinse  15 mL Mouth Rinse BID  . metoprolol tartrate  2.5 mg Intravenous Q6H  . multivitamin with minerals  1 tablet Oral Daily   acetaminophen **OR** acetaminophen, ondansetron **OR** ondansetron (ZOFRAN) IV, phenol, promethazine  Assessment/ Plan:  Mr. Roshad Hack is a 67 y.o. white male with osteomyelitis, hypertension, diabetes mellitus type II, atrial fibrillation, history of acute kidney injury requiring hemodialysis, obstructive sleep apnea , who was admitted to [facility] on 03/05/2018 for Ulcer of left foot   1. Acute renal failure on chronic kidney disease stage III with proteinuria: History of acute renal failure requiring hemodialysis.   Chronic kidney disease secondary diabetes, hypertension and acute injury with limited recovery.  Acute kidney failure secondary to sepsis, bacteremia, E. Coli UTI - Nonoliguric urine output.  - Continue  IV fluids  - Holding bumetanide.   2. Hypertension:  Home regimen of bumetanide, carvedilol, and hydralazine.   3. Diabetes mellitus type II with chronic kidney disease: insulin dependent.  Complication of left lower extremity ulcer. Hemoglobin A1c 7.3% - holding metformin - Appreciate podiatry and vascular input. Debridement scheduled for later today   4. Small bowel obstruction: NGT placed. Appreciate surgery input.    LOS: 3 Alan Dillon 7/17/201912:30 PM

## 2018-03-08 NOTE — Transfer of Care (Signed)
Immediate Anesthesia Transfer of Care Note  Patient: Alan Dillon  Procedure(s) Performed: IRRIGATION AND DEBRIDEMENT FOOT- LEFT HEEL (Left Foot) APPLICATION OF WOUND VAC (Left Foot)  Patient Location: PACU  Anesthesia Type:MAC  Level of Consciousness: awake, alert  and oriented  Airway & Oxygen Therapy: Patient connected to face mask oxygen  Post-op Assessment: Post -op Vital signs reviewed and stable  Post vital signs: stable  Last Vitals:  Vitals Value Taken Time  BP 128/63 03/08/2018  6:58 PM  Temp 36.5 C 03/08/2018  6:58 PM  Pulse 92 03/08/2018  6:58 PM  Resp    SpO2 91 % 03/08/2018  6:58 PM  Vitals shown include unvalidated device data.  Last Pain:  Vitals:   03/08/18 1858  TempSrc: Temporal  PainSc:          Complications: No apparent anesthesia complications

## 2018-03-08 NOTE — Progress Notes (Signed)
May have clear liquids after procedure.

## 2018-03-09 ENCOUNTER — Inpatient Hospital Stay: Payer: Medicare Other

## 2018-03-09 ENCOUNTER — Encounter: Payer: Self-pay | Admitting: Podiatry

## 2018-03-09 LAB — BASIC METABOLIC PANEL
Anion gap: 11 (ref 5–15)
BUN: 29 mg/dL — ABNORMAL HIGH (ref 8–23)
CALCIUM: 8.1 mg/dL — AB (ref 8.9–10.3)
CHLORIDE: 93 mmol/L — AB (ref 98–111)
CO2: 39 mmol/L — AB (ref 22–32)
CREATININE: 1.48 mg/dL — AB (ref 0.61–1.24)
GFR calc Af Amer: 55 mL/min — ABNORMAL LOW (ref >60)
GFR calc non Af Amer: 47 mL/min — ABNORMAL LOW (ref >60)
GLUCOSE: 137 mg/dL — AB (ref 70–99)
Potassium: 3.6 mmol/L (ref 3.5–5.1)
Sodium: 143 mmol/L (ref 135–145)

## 2018-03-09 LAB — GLUCOSE, CAPILLARY
Glucose-Capillary: 123 mg/dL — ABNORMAL HIGH (ref 70–99)
Glucose-Capillary: 126 mg/dL — ABNORMAL HIGH (ref 70–99)
Glucose-Capillary: 159 mg/dL — ABNORMAL HIGH (ref 70–99)

## 2018-03-09 LAB — CBC
HCT: 31.8 % — ABNORMAL LOW (ref 40.0–52.0)
HEMOGLOBIN: 10.5 g/dL — AB (ref 13.0–18.0)
MCH: 28.6 pg (ref 26.0–34.0)
MCHC: 32.9 g/dL (ref 32.0–36.0)
MCV: 86.8 fL (ref 80.0–100.0)
PLATELETS: 209 10*3/uL (ref 150–440)
RBC: 3.66 MIL/uL — AB (ref 4.40–5.90)
RDW: 18.4 % — ABNORMAL HIGH (ref 11.5–14.5)
WBC: 9.4 10*3/uL (ref 3.8–10.6)

## 2018-03-09 LAB — C DIFFICILE QUICK SCREEN W PCR REFLEX
C DIFFICILE (CDIFF) TOXIN: NEGATIVE
C Diff antigen: NEGATIVE
C Diff interpretation: NOT DETECTED

## 2018-03-09 MED ORDER — LOPERAMIDE HCL 2 MG PO CAPS
2.0000 mg | ORAL_CAPSULE | Freq: Once | ORAL | Status: AC
  Administered 2018-03-09: 2 mg via ORAL
  Filled 2018-03-09: qty 1

## 2018-03-09 MED ORDER — OXYCODONE-ACETAMINOPHEN 5-325 MG PO TABS
1.0000 | ORAL_TABLET | ORAL | Status: DC | PRN
  Administered 2018-03-11: 1 via ORAL
  Filled 2018-03-09: qty 1

## 2018-03-09 MED ORDER — DIATRIZOATE MEGLUMINE & SODIUM 66-10 % PO SOLN
240.0000 mL | Freq: Once | ORAL | Status: AC
  Administered 2018-03-09: 240 mL via ORAL
  Filled 2018-03-09: qty 240

## 2018-03-09 MED ORDER — APIXABAN 5 MG PO TABS
5.0000 mg | ORAL_TABLET | Freq: Two times a day (BID) | ORAL | Status: DC
  Administered 2018-03-09 – 2018-03-12 (×7): 5 mg via ORAL
  Filled 2018-03-09 (×7): qty 1

## 2018-03-09 MED ORDER — METOPROLOL TARTRATE 25 MG PO TABS
25.0000 mg | ORAL_TABLET | Freq: Two times a day (BID) | ORAL | Status: DC
  Administered 2018-03-09 – 2018-03-10 (×2): 25 mg via ORAL
  Filled 2018-03-09 (×2): qty 1

## 2018-03-09 MED ORDER — IPRATROPIUM-ALBUTEROL 0.5-2.5 (3) MG/3ML IN SOLN
3.0000 mL | Freq: Three times a day (TID) | RESPIRATORY_TRACT | Status: DC
  Administered 2018-03-09 – 2018-03-11 (×5): 3 mL via RESPIRATORY_TRACT
  Filled 2018-03-09 (×5): qty 3

## 2018-03-09 NOTE — Progress Notes (Signed)
[facility]  ROUNDING NOTE   Subjective:   Creatinine 1.48 (1.59)  Surgery earlier today.   Objective:  Vital signs in last 24 hours:  Temp:  [97.5 F (36.4 C)-98.7 F (37.1 C)] 98.7 F (37.1 C) (07/18 0800) Pulse Rate:  [55-90] 90 (07/18 0800) Resp:  [12-20] 18 (07/18 0323) BP: (118-168)/(63-88) 165/82 (07/18 0800) SpO2:  [90 %-100 %] 98 % (07/18 0800) Weight:  [161.3 kg (355 lb 11.2 oz)] 161.3 kg (355 lb 11.2 oz) (07/18 0323)  Weight change: -0.454 kg (-1 lb) Filed Weights   03/07/18 0439 03/08/18 0433 03/09/18 0323  Weight: (!) 165.2 kg (364 lb 3.2 oz) (!) 161.8 kg (356 lb 11.2 oz) (!) 161.3 kg (355 lb 11.2 oz)    Intake/Output: I/O last 3 completed shifts: In: 3909.8 [P.O.:600; I.V.:3109.8; IV Piggyback:200] Out: 5575 [Urine:4405; Emesis/NG output:950; Drains:200; Blood:20]   Intake/Output this shift:  No intake/output data recorded.  Physical Exam: General: NAD   Head: +NGT   Eyes: Anicteric, PERRL  Neck: Supple, trachea midline  Lungs:  Clear to auscultation  Heart: Regular rate and rhythm  Abdomen:  Obese, +bowel sounds  Extremities:  bilateral peripheral edema.  Neurologic: Nonfocal, moving all four extremities  Skin: No lesions        Basic Metabolic Panel: Recent Labs  Lab 03/05/18 0646 03/06/18 0559 03/07/18 0321 03/07/18 0554 03/08/18 0519 03/09/18 0427  NA 141 142  --  148* 146* 143  K 3.9 3.6  --  3.5 3.5 3.6  CL 101 99  --  99 94* 93*  CO2 28 32  --  40* 44* 39*  GLUCOSE 173* 148*  --  112* 105* 137*  BUN 29* 33*  --  31* 28* 29*  CREATININE 1.86* 1.95*  --  1.68* 1.59* 1.48*  CALCIUM 8.4* 7.8*  --  8.1* 8.2* 8.1*  MG  --   --  2.6*  --   --   --   PHOS  --   --  2.9 3.0 2.8  --     Liver Function Tests: Recent Labs  Lab 03/05/18 0646 03/07/18 0321 03/07/18 0554 03/08/18 0519  AST 33 69*  --   --   ALT 18 32  --   --   ALKPHOS 81 62  --   --   BILITOT 0.7 0.8  --   --   PROT 8.3* 7.8  --   --   ALBUMIN 3.4*  2.7* 2.7* 2.6*   Recent Labs  Lab 03/05/18 0646  LIPASE 28   Recent Labs  Lab 03/07/18 0548  AMMONIA <9*    CBC: Recent Labs  Lab 03/05/18 0646 03/06/18 0559 03/07/18 0321 03/09/18 0427  WBC 21.3* 12.5* 8.2 9.4  NEUTROABS 19.9*  --  6.4  --   HGB 12.6* 11.3* 11.1* 10.5*  HCT 38.9* 34.6* 33.9* 31.8*  MCV 87.0 86.8 87.2 86.8  PLT 272 205 227 209    Cardiac Enzymes: Recent Labs  Lab 03/05/18 0646 03/07/18 0321  CKTOTAL  --  768*  TROPONINI 0.03*  --     BNP: Invalid input(s): POCBNP  CBG: Recent Labs  Lab 03/08/18 0751 03/08/18 1135 03/08/18 1511 03/08/18 1917 03/09/18 0935  GLUCAP 109* 105* 110* 94 123*    Microbiology: Results for orders placed or performed during the hospital encounter of 03/05/18  Blood Culture (routine x 2)     Status: Abnormal   Collection Time: 03/05/18  6:47 AM  Result Value Ref Range  Status   Specimen Description   Final    BLOOD RIGHT FOREARM Performed at [facility], [address] [place], [place] [address]    Special Requests   Final    BOTTLES DRAWN AEROBIC AND ANAEROBIC Blood Culture adequate volume Performed at [facility], [address] [place], [place] [address]    Culture  Setup Time   Final    GRAM NEGATIVE RODS IN BOTH AEROBIC AND ANAEROBIC BOTTLES CRITICAL RESULT CALLED TO, READ BACK BY AND VERIFIED WITH: SHEEMA HALLAJI @ 1611 ON 03/05/2018 BY CAF Performed at [facility], [address] [place], [place] [address]    Culture (A)  Final    ESCHERICHIA COLI SUSCEPTIBILITIES PERFORMED ON PREVIOUS CULTURE WITHIN THE LAST 5 DAYS. Performed at [facility], [address] [place], [place] [address]    Report Status 03/07/2018 FINAL  Final  Blood Culture (routine x 2)     Status: Abnormal   Collection Time: 03/05/18  6:49 AM  Result Value Ref Range Status   Specimen Description   Final    BLOOD RIGHT HAND Performed at [facility], [address]  [place], [place] [address]    Special Requests   Final    BOTTLES DRAWN AEROBIC AND ANAEROBIC Blood Culture results may not be optimal due to an excessive volume of blood received in culture bottles Performed at [facility], [address] [place], [place] [address]    Culture  Setup Time   Final    IN BOTH AEROBIC AND ANAEROBIC BOTTLES GRAM NEGATIVE RODS CRITICAL RESULT CALLED TO, READ BACK BY AND VERIFIED WITH: SHEEMA HALLAJI @ 1611 ON 03/05/2018 BY CAF Performed at [facility], [address] [place], [place] [address]    Culture ESCHERICHIA COLI (A)  Final   Report Status 03/07/2018 FINAL  Final   Organism ID, Bacteria ESCHERICHIA COLI  Final      Susceptibility   Escherichia coli - MIC*    AMPICILLIN >=32 RESISTANT Resistant     CEFAZOLIN <=4 SENSITIVE Sensitive     CEFEPIME <=1 SENSITIVE Sensitive     CEFTAZIDIME <=1 SENSITIVE Sensitive     CEFTRIAXONE <=1 SENSITIVE Sensitive     CIPROFLOXACIN >=4 RESISTANT Resistant     GENTAMICIN <=1 SENSITIVE Sensitive     IMIPENEM <=0.25 SENSITIVE Sensitive     TRIMETH/SULFA >=320 RESISTANT Resistant     AMPICILLIN/SULBACTAM 16 INTERMEDIATE Intermediate     PIP/TAZO <=4 SENSITIVE Sensitive     Extended ESBL NEGATIVE Sensitive     * ESCHERICHIA COLI  Blood Culture ID Panel (Reflexed)     Status: Abnormal   Collection Time: 03/05/18  6:49 AM  Result Value Ref Range Status   Enterococcus species NOT DETECTED NOT DETECTED Final   Listeria monocytogenes NOT DETECTED NOT DETECTED Final   Staphylococcus species NOT DETECTED NOT DETECTED Final   Staphylococcus aureus NOT DETECTED NOT DETECTED Final   Streptococcus species NOT DETECTED NOT DETECTED Final   Streptococcus agalactiae NOT DETECTED NOT DETECTED Final   Streptococcus pneumoniae NOT DETECTED NOT DETECTED Final   Streptococcus pyogenes NOT DETECTED NOT DETECTED Final   Acinetobacter baumannii NOT DETECTED NOT DETECTED Final   Enterobacteriaceae species DETECTED (A)  NOT DETECTED Final    Comment: Enterobacteriaceae represent a large family of gram-negative bacteria, not a single organism. CRITICAL RESULT CALLED TO, READ BACK BY AND VERIFIED WITH: SHEEMA HALLAJI @ 1611 ON 03/05/2018 BY CAF    Enterobacter  cloacae complex NOT DETECTED NOT DETECTED Final   Escherichia coli DETECTED (A) NOT DETECTED Final    Comment: CRITICAL RESULT CALLED TO, READ BACK BY AND VERIFIED WITH: SHEEMA HALLAJI @ 1611 ON 03/05/2018 BY CAF    Klebsiella oxytoca NOT DETECTED NOT DETECTED Final   Klebsiella pneumoniae NOT DETECTED NOT DETECTED Final   Proteus species NOT DETECTED NOT DETECTED Final   Serratia marcescens NOT DETECTED NOT DETECTED Final   Carbapenem resistance NOT DETECTED NOT DETECTED Final   Haemophilus influenzae NOT DETECTED NOT DETECTED Final   Neisseria meningitidis NOT DETECTED NOT DETECTED Final   Pseudomonas aeruginosa NOT DETECTED NOT DETECTED Final   Candida albicans NOT DETECTED NOT DETECTED Final   Candida glabrata NOT DETECTED NOT DETECTED Final   Candida krusei NOT DETECTED NOT DETECTED Final   Candida parapsilosis NOT DETECTED NOT DETECTED Final   Candida tropicalis NOT DETECTED NOT DETECTED Final    Comment: Performed at [facility], [address] [place], [place] [address]  Urine culture     Status: None   Collection Time: 03/05/18  7:19 AM  Result Value Ref Range Status   Specimen Description   Final    URINE, RANDOM Performed at [facility], [address] [place], [place] [address]    Special Requests   Final    NONE Performed at [facility], [address] [place], [place] [address]    Culture   Final    NO GROWTH Performed at [facility], [address] [place], [place] [address]    Report Status 03/06/2018 FINAL  Final  MRSA PCR Screening     Status: None   Collection Time: 03/06/18 12:58 PM  Result Value Ref Range Status   MRSA by PCR NEGATIVE NEGATIVE Final    Comment:        The  GeneXpert MRSA Assay (FDA approved for NASAL specimens only), is one component of a comprehensive MRSA colonization surveillance program. It is not intended to diagnose MRSA infection nor to guide or monitor treatment for MRSA infections. Performed at [facility], [address] [place], [place] [address]   Culture, blood (single) w Reflex to ID Panel     Status: None (Preliminary result)   Collection Time: 03/07/18  3:41 PM  Result Value Ref Range Status   Specimen Description BLOOD BLOOD LEFT HAND  Final   Special Requests   Final    BOTTLES DRAWN AEROBIC AND ANAEROBIC Blood Culture results may not be optimal due to an inadequate volume of blood received in culture bottles   Culture   Final    NO GROWTH 2 DAYS Performed at [facility], [address] [place], [place] [address]    Report Status PENDING  Incomplete    Coagulation Studies: No results for input(s): LABPROT, INR in the last 72 hours.  Urinalysis: No results for input(s): COLORURINE, LABSPEC, PHURINE, GLUCOSEU, HGBUR, BILIRUBINUR, KETONESUR, PROTEINUR, UROBILINOGEN, NITRITE, LEUKOCYTESUR in the last 72 hours.  Invalid input(s): APPERANCEUR    Imaging: Dg Abd 1 View  Result Date: 03/08/2018 CLINICAL DATA:  Ileus  Follow up , SBO EXAM: ABDOMEN - 1 VIEW COMPARISON:  [id number] FINDINGS: Eighteen No dilated loops of large or small bowel. Gas expanded stomach. No pathologic calcifications. No organomegaly. No aggressive osseous lesion. No intraperitoneal free air. IMPRESSION: No acute abdominal findings. Electronically Signed   By: Genevive BiStewart  Edmunds M.D.   On: 03/08/2018 12:19   [place] Venous Img Lower Bilateral  Result Date: 03/07/2018 CLINICAL DATA:  Bilateral lower extremity edema. History of bladder cancer. Evaluate for DVT. EXAM: BILATERAL LOWER EXTREMITY VENOUS DOPPLER ULTRASOUND TECHNIQUE: Gray-scale sonography with graded compression, as well as color Doppler and duplex ultrasound were performed to  evaluate the lower extremity deep venous systems from the level of the common femoral vein and including the common femoral, femoral, profunda femoral, popliteal and calf veins including the posterior tibial, peroneal and gastrocnemius veins when visible. The superficial great saphenous vein was also interrogated. Spectral Doppler was utilized to evaluate flow at rest and with distal augmentation maneuvers in the common femoral, femoral and popliteal veins. COMPARISON:  None. FINDINGS: RIGHT LOWER EXTREMITY Common Femoral Vein: No evidence of thrombus. Normal compressibility, respiratory phasicity and response to augmentation. Saphenofemoral Junction: No evidence of thrombus. Normal compressibility and flow on color Doppler imaging. Profunda Femoral Vein: No evidence of thrombus. Normal compressibility and flow on color Doppler imaging. Femoral Vein: No evidence of thrombus. Normal compressibility, respiratory phasicity and response to augmentation. Popliteal Vein: No evidence of thrombus. Normal compressibility, respiratory phasicity and response to augmentation. Calf Veins: Appear patent where imaged Superficial Great Saphenous Vein: No evidence of thrombus. Normal compressibility. Venous Reflux:  None. Other Findings:  None. LEFT LOWER EXTREMITY Common Femoral Vein: No evidence of thrombus. Normal compressibility, respiratory phasicity and response to augmentation. Saphenofemoral Junction: No evidence of thrombus. Normal compressibility and flow on color Doppler imaging. Profunda Femoral Vein: No evidence of thrombus. Normal compressibility and flow on color Doppler imaging. Femoral Vein: No evidence of thrombus. Normal compressibility, respiratory phasicity and response to augmentation. Popliteal Vein: No evidence of thrombus. Normal compressibility, respiratory phasicity and response to augmentation. Calf Veins: Appear patent where imaged. Superficial Great Saphenous Vein: No evidence of thrombus. Normal  compressibility. Venous Reflux:  None. Other Findings:  None. IMPRESSION: No evidence of DVT within either lower extremity. Electronically Signed   By: Simonne Come M.D.   On: 03/07/2018 15:30   Dg Kayleen Memos W/water Sol Cm  Result Date: 03/09/2018 CLINICAL DATA:  Possible ileus.  NG tube pole yesterday. EXAM: UPPER GI SERIES WITHOUT KUB TECHNIQUE: Routine upper GI series was performed with water-soluble barium. FLUOROSCOPY TIME:  Fluoroscopy Time:  0 minutes 48 seconds Radiation Exposure Index (if provided by the fluoroscopic device): 28 mGy Number of Acquired Spot Images: 7 COMPARISON:  03/08/2018. FINDINGS: Esophagus is widely patent. Delay of contrast noted in the stomach. Stomach and duodenum are patent. Contrast empties slowly into the small bowel. Repeat KUB can be obtained in a.m. to demonstrate continued passage of contrast. IMPRESSION: Esophagus, stomach, duodenum, proximal small bowel are patent. There is delayed emptying from the stomach. Follow-up exam can be obtained to demonstrate continued emptying. Electronically Signed   By: Maisie Fus  Register   On: 03/09/2018 13:01     Medications:   . sodium chloride 75 mL/hr at 03/08/18 2017  .  ceFAZolin (ANCEF) IV Stopped (03/09/18 0549)   . apixaban  5 mg Oral BID  . brimonidine  1 drop Both Eyes BID  . docusate sodium  100 mg Oral BID  . ferrous sulfate  325 mg Oral BID  . insulin aspart  0-15 Units Subcutaneous TID WC  . ipratropium-albuterol  3 mL Nebulization Q6H  . mouth rinse  15 mL Mouth Rinse BID  . metoprolol tartrate  2.5 mg Intravenous Q6H  . multivitamin with minerals  1 tablet Oral Daily   acetaminophen **OR** acetaminophen, ondansetron **OR** ondansetron (ZOFRAN) IV, oxyCODONE-acetaminophen, phenol, promethazine  Assessment/ Plan:  Mr. Romone Shaff is a 67 y.o. white male with osteomyelitis, hypertension, diabetes mellitus type II, atrial fibrillation, history of acute kidney injury requiring hemodialysis, obstructive sleep apnea  , who was admitted to [facility] on [facility]05/2018 for Ulcer of left foot   1. Acute renal failure on chronic kidney disease stage III with proteinuria: History of acute renal failure requiring hemodialysis.   Chronic kidney disease secondary diabetes, hypertension and acute injury with limited recovery.  Acute kidney failure secondary to sepsis, bacteremia, E. Coli UTI - Nonoliguric urine output.  - Continue  IV fluids  - Holding bumetanide.   2. Hypertension:  Home regimen of bumetanide, carvedilol, and hydralazine.   3. Diabetes mellitus type II with chronic kidney disease: insulin dependent.  Complication of left lower extremity ulcer. Hemoglobin A1c 7.3% - holding metformin - Appreciate podiatry and vascular input.    4. Small bowel obstruction: NGT placed. Appreciate surgery input.    LOS: 4 Renai Lopata 7/18/20192:15 PM

## 2018-03-09 NOTE — Progress Notes (Signed)
[place] [facility] [place] NAME: Alan Dillon    MR#:  [id number]  DATE OF BIRTH:  [birth year]  SUBJECTIVE:   Patient states he is overall doing well today. No foot pain. He has been having multiple episodes of diarrhea. No nausea or vomiting. Abdomen feels bloated, but no abdominal pain.  REVIEW OF SYSTEMS:    Review of Systems  Constitutional: Negative for chills and fever.  HENT: Negative for congestion and tinnitus.   Eyes: Negative for blurred vision and double vision.  Respiratory: Negative for cough, shortness of breath and wheezing.   Cardiovascular: Negative for chest pain, orthopnea and PND.  Gastrointestinal: Positive for diarrhea. Negative for abdominal pain, nausea and vomiting.  Genitourinary: Negative for dysuria and hematuria.  Neurological: Positive for weakness. Negative for dizziness, sensory change and focal weakness.  All other systems reviewed and are negative.   Nutrition: NPO  Tolerating Diet: No Tolerating PT: Await Eval.   DRUG ALLERGIES:   Allergies  Allergen Reactions  . Bactrim [Sulfamethoxazole-Trimethoprim] Other (See Comments)    Caused kidney failure    VITALS:  Blood pressure (!) 158/86, pulse 95, temperature (!) 97.5 F (36.4 C), temperature source Oral, resp. rate (!) 22, height _0  (1.854 m), weight (!) 161.3 kg (355 lb 11.2 oz), SpO2 99 %.  PHYSICAL EXAMINATION:   Physical Exam  GENERAL:  67 y.o.-year-old morbidly obese patient lying in bed in NAD.  EYES: PERRLA, EOMI, no scleral icterus HEENT: [place]/AT NECK:  Supple, no JVD. No thyroid enlargement, no tenderness.  LUNGS: Normal work of breathing, no wheezes or crackles  CARDIOVASCULAR: RRR, no murmurs, rubs, or gallops.  ABDOMEN: obese, +BS, soft, protuberant, non-tender EXTREMITIES: No cyanosis, clubbing, 1-2+ pitting edema to the mid-shin bilaterally NEUROLOGIC: CN 2-12 grossly intact, no focal deficits PSYCHIATRIC: The patient is alert and  oriented x 3, appropriate affect SKIN: No obvious rash, lesion, or ulcer. Kerlex wrap present over left foot, bandage is dry. +venous stasis changes bilaterally   LABORATORY PANEL:   CBC Recent Labs  Lab 03/09/18 0427  WBC 9.4  HGB 10.5*  HCT 31.8*  PLT 209   ------------------------------------------------------------------------------------------------------------------  Chemistries  Recent Labs  Lab 03/07/18 0321  03/09/18 0427  NA  --    < > 143  K  --    < > 3.6  CL  --    < > 93*  CO2  --    < > 39*  GLUCOSE  --    < > 137*  BUN  --    < > 29*  CREATININE  --    < > 1.48*  CALCIUM  --    < > 8.1*  MG 2.6*  --   --   AST 69*  --   --   ALT 32  --   --   ALKPHOS 62  --   --   BILITOT 0.8  --   --    < > = values in this interval not displayed.   ------------------------------------------------------------------------------------------------------------------  Cardiac Enzymes Recent Labs  Lab 03/05/18 0646  TROPONINI 0.03*   ------------------------------------------------------------------------------------------------------------------  RADIOLOGY:  Dg Abd 1 View  Result Date: 03/08/2018 CLINICAL DATA:  Ileus  Follow up , SBO EXAM: ABDOMEN - 1 VIEW COMPARISON:  [id number] FINDINGS: Eighteen No dilated loops of large or small bowel. Gas expanded stomach. No pathologic calcifications. No organomegaly. No aggressive osseous lesion. No intraperitoneal free air. IMPRESSION: No acute abdominal findings. Electronically Signed  By: Suzy Bouchard M.D.   On: 03/08/2018 12:19   Dg Duanne Limerick W/water Sol Cm  Result Date: 03/09/2018 CLINICAL DATA:  Possible ileus.  NG tube pole yesterday. EXAM: UPPER GI SERIES WITHOUT KUB TECHNIQUE: Routine upper GI series was performed with water-soluble barium. FLUOROSCOPY TIME:  Fluoroscopy Time:  0 minutes 48 seconds Radiation Exposure Index (if provided by the fluoroscopic device): 28 mGy Number of Acquired Spot Images: 7 COMPARISON:   03/08/2018. FINDINGS: Esophagus is widely patent. Delay of contrast noted in the stomach. Stomach and duodenum are patent. Contrast empties slowly into the small bowel. Repeat KUB can be obtained in a.m. to demonstrate continued passage of contrast. IMPRESSION: Esophagus, stomach, duodenum, proximal small bowel are patent. There is delayed emptying from the stomach. Follow-up exam can be obtained to demonstrate continued emptying. Electronically Signed   By: Marcello Moores  Register   On: 03/09/2018 13:01     ASSESSMENT AND PLAN:   67 year old male with past medical history of morbid obesity, diabetes, obstructive sleep apnea, hypertension, chronic kidney disease stage III, chronic atrial fibrillation who presented to the hospital due to generalized weakness, lethargy and noted to have sepsis secondary to left foot osteomyelitis.  1.  Sepsis- patient met criteria given his leukocytosis, hypotension, leukocytosis and noted to have infected left foot diabetic ulcer with osteomyelitis. - Blood cultures are positive for pansensitive E. coli  - Cont. IV ancef for now  2.  Left foot osteomyelitis- s/p I&D left heel with debridement of infected bone on 7/17 with Dr. Cleda Mccreedy - bone culture taken during surgery was sent to path in formalin, so unable to obtain adequate bone cultures - will consult ID for recommendations on abx. Will likely need 6 weeks of IV abx through PICC. - wound vac placement every 3 days  3.  Acute on chronic respiratory failure with hypercapnia- secondary to underlying obstructive sleep apnea, obesity pickwickian syndrome. Improved. Now on 3L O2 (on 2L O2 at home) -Patient developed worsening hypercapnia and was transferred to the ICU, placed on BiPAP and hypercapnia has improved.  Patient's mental status is also improved.  Patient off BiPAP now and has been stable.  4.  Ileus- patient was having persistent nausea/vomiting that has now resolved. -Patient had NG tube placed for 24 to 48  hours and had significant drainage from it. - General surgery following. Upper GI series today without signs of gastric outlet obstruction, but was more consistent with gastroparesis.  -start full liquid diet and will advance as tolerated  5. Chronic diastolic CHF- stable, not in an exacerbation - holding bumetanide and currently on IVFs - continue IV lopressor 2.67m q6hrs - will change back to home coreg tomorrow if patient is tolerating po  6. CKD stage III - nephrology following - continue holding bumetanide and continue gentle IVFs for now -Renal ultrasound showing no evidence of obstruction or hydronephrosis.  7.  Diabetes type 2 without complication-blood sugars well controlled - moderate SSI  8.  Hyperlipidemia - continue lipitor  9. Chronic A. Fib - rate controlled - continue eliquis - lopressor IV for rate control, plan to switch back to home coreg tomorrow  All the records are reviewed and case discussed with Care Management/Social Worker. Management plans discussed with the patient, family and they are in agreement.  CODE STATUS: Full code  DVT Prophylaxis: eliquis  TOTAL Critical Care TIME TAKING CARE OF THIS PATIENT: 30 minutes.   POSSIBLE D/C unclear , DEPENDING ON CLINICAL CONDITION.   KBerna SpareMayo M.D  on 03/09/2018 at 6:13 PM  Between 7am to 6pm - Pager - [phone]  After 6pm go to [email or web address] - Proofreader  [facility]  Office  [phone]  CC: Primary care physician; Neta Ehlers, MD

## 2018-03-09 NOTE — Clinical Social Work Note (Signed)
CSW received consult for patient possibly needing SNF, awaiting PT recommendations.  Ervin KnackEric R. Yeshaya Vath, MSW, Theresia MajorsLCSWA [phone]  03/09/2018 5:24 PM

## 2018-03-09 NOTE — Progress Notes (Signed)
Talked to Dr. Nancy MarusMayo, about patient's c-diff came back negative, order to discontinue isolation order. RN will continue to monitor.

## 2018-03-09 NOTE — Progress Notes (Signed)
Talked to Dr. Caryn BeeMaier about patient's multiple loose stool, Cdiff is negative, order for one time dose of Imodium. RN will continue to monitor.

## 2018-03-09 NOTE — Progress Notes (Signed)
Pt refused BiPAP mask tonight, pt encouraged to call if he changes his mind

## 2018-03-09 NOTE — Progress Notes (Signed)
Talked to Dr. Cherlynn KaiserSainani about patient's order of 2.5mg  IV metoprolol if ok to change to oral. Patient is tolerating p.o diet now, order ok to change. Called pharmacy to change IV to oral metoprolol. RN will continue to monitor.

## 2018-03-09 NOTE — Progress Notes (Signed)
Patient ID: Alan Dillon, male   DOB: 1950/12/31, 67 y.o.   MRN: [id number]     SURGICAL PROGRESS NOTE   Hospital Day(s): 4.   Post op day(s): 1 Day Post-Op.   Interval History: Patient seen and examined, no acute events or new complaints overnight. Patient reports has no nausea or vomiting despite NGT removed yesterday. Today had upper GI series. Had a big bowel movement today.   Vital signs in last 24 hours: [min-max] current  Temp:  [97.5 F (36.4 C)-98.7 F (37.1 C)] 98.7 F (37.1 C) (07/18 0800) Pulse Rate:  [55-90] 90 (07/18 0800) Resp:  [12-18] 18 (07/18 0323) BP: (118-165)/(63-88) 165/82 (07/18 0800) SpO2:  [90 %-100 %] 98 % (07/18 0800) Weight:  [161.3 kg (355 lb 11.2 oz)] 161.3 kg (355 lb 11.2 oz) (07/18 0323)     Height: 6\' 1"  (185.4 cm) Weight: (!) 161.3 kg (355 lb 11.2 oz) BMI (Calculated): 46.94    Physical Exam:  Constitutional: alert, cooperative and no distress  Gastrointestinal: soft, non-tender, and non-distended  Labs:  CBC Latest Ref Rng & Units 03/09/2018 03/07/2018 03/06/2018  WBC 3.8 - 10.6 K/uL 9.4 8.2 12.5(H)  Hemoglobin 13.0 - 18.0 g/dL 10.5(L) 11.1(L) 11.3(L)  Hematocrit 40.0 - 52.0 % 31.8(L) 33.9(L) 34.6(L)  Platelets 150 - 440 K/uL 209 227 205   CMP Latest Ref Rng & Units 03/09/2018 03/08/2018 03/07/2018  Glucose 70 - 99 mg/dL 409(W137(H) 119(J105(H) 478(G112(H)  BUN 8 - 23 mg/dL 95(A29(H) 21(H28(H) 08(M31(H)  Creatinine 0.61 - 1.24 mg/dL 5.78(I1.48(H) 6.96(E1.59(H) 9.52(W1.68(H)  Sodium 135 - 145 mmol/L 143 146(H) 148(H)  Potassium 3.5 - 5.1 mmol/L 3.6 3.5 3.5  Chloride 98 - 111 mmol/L 93(L) 94(L) 99  CO2 22 - 32 mmol/L 39(H) 44(H) 40(H)  Calcium 8.9 - 10.3 mg/dL 8.1(L) 8.2(L) 8.1(L)  Total Protein 6.5 - 8.1 g/dL - - 7.8  Total Bilirubin 0.3 - 1.2 mg/dL - - 0.8  Alkaline Phos 38 - 126 U/L - - 62  AST 15 - 41 U/L - - 69(H)  ALT 0 - 44 U/L - - 32    Imaging studies: GI series show delayed gastric emptying. Stomach and duodenum patent.    Assessment/Plan:  No sign of gastric outlet  obstruction. Upper GI series finding more consistent with Gastroparesis. Recommend to treat medically as needed.  Full liquid diet started and progress as tolerated.   Gae GallopEdgardo Cintrn-Daz, MD

## 2018-03-09 NOTE — Progress Notes (Addendum)
1 Day Post-Op   Subjective/Chief Complaint: Patient seen.  No significant complaints of pain in his left foot.   Objective: Vital signs in last 24 hours: Temp:  [97.5 F (36.4 C)-98.7 F (37.1 C)] 98.7 F (37.1 C) (07/18 0800) Pulse Rate:  [55-90] 90 (07/18 0800) Resp:  [12-20] 18 (07/18 0323) BP: (118-168)/(63-88) 165/82 (07/18 0800) SpO2:  [90 %-100 %] 98 % (07/18 0800) Weight:  [161.3 kg (355 lb 11.2 oz)] 161.3 kg (355 lb 11.2 oz) (07/18 0323) Last BM Date: 03/04/18  Intake/Output from previous day: 07/17 0701 - 07/18 0700 In: 3909.8 [P.O.:600; I.V.:3109.8; IV Piggyback:200] Out: 3950 [Urine:2980; Emesis/NG output:750; Drains:200; Blood:20] Intake/Output this shift: No intake/output data recorded.  Wound VAC appears to be intact and functioning.  Bandages dry.  Lab Results:  Recent Labs    03/07/18 0321 03/09/18 0427  WBC 8.2 9.4  HGB 11.1* 10.5*  HCT 33.9* 31.8*  PLT 227 209   BMET Recent Labs    03/08/18 0519 03/09/18 0427  NA 146* 143  K 3.5 3.6  CL 94* 93*  CO2 44* 39*  GLUCOSE 105* 137*  BUN 28* 29*  CREATININE 1.59* 1.48*  CALCIUM 8.2* 8.1*   PT/INR No results for input(s): LABPROT, INR in the last 72 hours. ABG Recent Labs    03/07/18 0321 03/07/18 0949  PHART 7.44 7.44  HCO3 43.5* 46.9*    Studies/Results: Dg Abd 1 View  Result Date: 03/08/2018 CLINICAL DATA:  Ileus  Follow up , SBO EXAM: ABDOMEN - 1 VIEW COMPARISON:  [id number] FINDINGS: Eighteen No dilated loops of large or small bowel. Gas expanded stomach. No pathologic calcifications. No organomegaly. No aggressive osseous lesion. No intraperitoneal free air. IMPRESSION: No acute abdominal findings. Electronically Signed   By: Genevive BiStewart  Edmunds M.D.   On: 03/08/2018 12:19   [place] Venous Img Lower Bilateral  Result Date: 03/07/2018 CLINICAL DATA:  Bilateral lower extremity edema. History of bladder cancer. Evaluate for DVT. EXAM: BILATERAL LOWER EXTREMITY VENOUS DOPPLER ULTRASOUND TECHNIQUE:  Gray-scale sonography with graded compression, as well as color Doppler and duplex ultrasound were performed to evaluate the lower extremity deep venous systems from the level of the common femoral vein and including the common femoral, femoral, profunda femoral, popliteal and calf veins including the posterior tibial, peroneal and gastrocnemius veins when visible. The superficial great saphenous vein was also interrogated. Spectral Doppler was utilized to evaluate flow at rest and with distal augmentation maneuvers in the common femoral, femoral and popliteal veins. COMPARISON:  None. FINDINGS: RIGHT LOWER EXTREMITY Common Femoral Vein: No evidence of thrombus. Normal compressibility, respiratory phasicity and response to augmentation. Saphenofemoral Junction: No evidence of thrombus. Normal compressibility and flow on color Doppler imaging. Profunda Femoral Vein: No evidence of thrombus. Normal compressibility and flow on color Doppler imaging. Femoral Vein: No evidence of thrombus. Normal compressibility, respiratory phasicity and response to augmentation. Popliteal Vein: No evidence of thrombus. Normal compressibility, respiratory phasicity and response to augmentation. Calf Veins: Appear patent where imaged Superficial Great Saphenous Vein: No evidence of thrombus. Normal compressibility. Venous Reflux:  None. Other Findings:  None. LEFT LOWER EXTREMITY Common Femoral Vein: No evidence of thrombus. Normal compressibility, respiratory phasicity and response to augmentation. Saphenofemoral Junction: No evidence of thrombus. Normal compressibility and flow on color Doppler imaging. Profunda Femoral Vein: No evidence of thrombus. Normal compressibility and flow on color Doppler imaging. Femoral Vein: No evidence of thrombus. Normal compressibility, respiratory phasicity and response to augmentation. Popliteal Vein: No evidence of thrombus. Normal compressibility,  respiratory phasicity and response to augmentation.  Calf Veins: Appear patent where imaged. Superficial Great Saphenous Vein: No evidence of thrombus. Normal compressibility. Venous Reflux:  None. Other Findings:  None. IMPRESSION: No evidence of DVT within either lower extremity. Electronically Signed   By: Simonne Come M.D.   On: 03/07/2018 15:30    Anti-infectives: Anti-infectives (From admission, onward)   Start     Dose/Rate Route Frequency Ordered Stop   03/08/18 1813  gentamicin (GARAMYCIN) injection  Status:  Discontinued       As needed 03/08/18 1814 03/08/18 1853   03/08/18 1800  vancomycin (VANCOCIN) powder  Status:  Discontinued       As needed 03/08/18 1803 03/08/18 1853   03/07/18 1400  ceFAZolin (ANCEF) IVPB 2g/100 mL premix     2 g 200 mL/hr over 30 Minutes Intravenous Every 8 hours 03/07/18 1201     03/05/18 1800  meropenem (MERREM) 1 g in sodium chloride 0.9 % 100 mL IVPB  Status:  Discontinued     1 g 200 mL/hr over 30 Minutes Intravenous Every 8 hours 03/05/18 1622 03/07/18 1055   03/05/18 1430  vancomycin (VANCOCIN) 1,750 mg in sodium chloride 0.9 % 500 mL IVPB  Status:  Discontinued     1,750 mg 250 mL/hr over 120 Minutes Intravenous Every 24 hours 03/05/18 1351 03/05/18 1620   03/05/18 1400  piperacillin-tazobactam (ZOSYN) IVPB 3.375 g  Status:  Discontinued     3.375 g 12.5 mL/hr over 240 Minutes Intravenous Every 8 hours 03/05/18 1334 03/05/18 1620   03/05/18 0715  piperacillin-tazobactam (ZOSYN) IVPB 3.375 g     3.375 g 100 mL/hr over 30 Minutes Intravenous  Once 03/05/18 0712 03/05/18 0745   03/05/18 0715  vancomycin (VANCOCIN) IVPB 1000 mg/200 mL premix     1,000 mg 200 mL/hr over 60 Minutes Intravenous  Once 03/05/18 0712 03/05/18 0837      Assessment/Plan: s/p Procedure(s): IRRIGATION AND DEBRIDEMENT FOOT- LEFT HEEL (Left) APPLICATION OF WOUND VAC (Left) Assessment: Calcaneal osteomyelitis, 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   Plan: Discussed with the patient and his wife that the bone  culture taken during surgery was inadvertently sent to pathology in formalin.  At this point we will not be able to obtain adequate bone cultures.  We will need infectious disease recommendations for antibiotic coverage as he will need PICC line with probably 6 weeks of IV antibiotics.  Also patient will most likely do best with some extended skilled nursing care.  Will need wound VAC placement every 3 days.  Should be stable for discharge with regards to the foot once he is otherwise medically stable and will follow up with the patient in a couple of weeks outpatient in the office.  LOS: 4 days    Ricci Barker 03/09/2018

## 2018-03-09 NOTE — Progress Notes (Signed)
Patient is postop I&D of the left foot. Patient returned to the unit about 830pm and accompanied by his wife. Patient's left foot has a bias wrap with with wound vac at -125. Patient has no acute event overnight denied being in pain and assisted as needed. Will continue to monitor

## 2018-03-10 LAB — BASIC METABOLIC PANEL WITH GFR
Anion gap: 7 (ref 5–15)
BUN: 27 mg/dL — ABNORMAL HIGH (ref 8–23)
CO2: 38 mmol/L — ABNORMAL HIGH (ref 22–32)
Calcium: 8.4 mg/dL — ABNORMAL LOW (ref 8.9–10.3)
Chloride: 95 mmol/L — ABNORMAL LOW (ref 98–111)
Creatinine, Ser: 1.36 mg/dL — ABNORMAL HIGH (ref 0.61–1.24)
GFR calc Af Amer: >60 mL/min (ref >60)
GFR calc non Af Amer: 52 mL/min — ABNORMAL LOW (ref >60)
Glucose, Bld: 150 mg/dL — ABNORMAL HIGH (ref 70–99)
Potassium: 3.6 mmol/L (ref 3.5–5.1)
Sodium: 140 mmol/L (ref 135–145)

## 2018-03-10 LAB — GLUCOSE, CAPILLARY
Glucose-Capillary: 173 mg/dL — ABNORMAL HIGH (ref 70–99)
Glucose-Capillary: 176 mg/dL — ABNORMAL HIGH (ref 70–99)
Glucose-Capillary: 190 mg/dL — ABNORMAL HIGH (ref 70–99)
Glucose-Capillary: 180 mg/dL — ABNORMAL HIGH (ref 70–99)

## 2018-03-10 LAB — CBC
HCT: 31.7 % — ABNORMAL LOW (ref 40.0–52.0)
HEMOGLOBIN: 10.6 g/dL — AB (ref 13.0–18.0)
MCH: 29 pg (ref 26.0–34.0)
MCHC: 33.5 g/dL (ref 32.0–36.0)
MCV: 86.7 fL (ref 80.0–100.0)
PLATELETS: 219 10*3/uL (ref 150–440)
RBC: 3.66 MIL/uL — AB (ref 4.40–5.90)
RDW: 18.9 % — ABNORMAL HIGH (ref 11.5–14.5)
WBC: 9.8 10*3/uL (ref 3.8–10.6)

## 2018-03-10 MED ORDER — CARVEDILOL 12.5 MG PO TABS
12.5000 mg | ORAL_TABLET | Freq: Two times a day (BID) | ORAL | Status: DC
Start: 2018-03-10 — End: 2018-03-14
  Administered 2018-03-10 – 2018-03-14 (×8): 12.5 mg via ORAL
  Filled 2018-03-10 (×8): qty 1

## 2018-03-10 MED ORDER — HYDRALAZINE HCL 25 MG PO TABS
25.0000 mg | ORAL_TABLET | Freq: Three times a day (TID) | ORAL | Status: DC
  Administered 2018-03-10 – 2018-03-13 (×9): 25 mg via ORAL
  Filled 2018-03-10 (×9): qty 1

## 2018-03-10 NOTE — Progress Notes (Signed)
Patient ID: Alan Dillon, male   DOB: Oct 06, 1950, 67 y.o.   MRN: [id number]     SURGICAL PROGRESS NOTE   Hospital Day(s): 5.   Post op day(s): 2 Days Post-Op.   Interval History: Patient seen and examined, no acute events or new complaints overnight. Patient reports feeling well. Denies nausea or vomiting. Refers tolerated full liquid without problem..  Vital signs in last 24 hours: [min-max] current  Temp:  [97.5 F (36.4 C)-98.9 F (37.2 C)] 98.8 F (37.1 C) (07/19 0321) Pulse Rate:  [68-95] 68 (07/19 0321) Resp:  [18-22] 18 (07/19 0321) BP: (154-181)/(71-92) 181/92 (07/19 0321) SpO2:  [95 %-99 %] 95 % (07/19 0734) Weight:  [167.3 kg (368 lb 14.4 oz)] 167.3 kg (368 lb 14.4 oz) (07/19 0329)     Height: 6\' 1"  (185.4 cm) Weight: (!) 167.3 kg (368 lb 14.4 oz) BMI (Calculated): 48.68    Physical Exam:  Constitutional: alert, cooperative and no distress  Gastrointestinal: soft, non-tender, and non-distended  Labs:  CBC Latest Ref Rng & Units 03/10/2018 03/09/2018 03/07/2018  WBC 3.8 - 10.6 K/uL 9.8 9.4 8.2  Hemoglobin 13.0 - 18.0 g/dL 10.6(L) 10.5(L) 11.1(L)  Hematocrit 40.0 - 52.0 % 31.7(L) 31.8(L) 33.9(L)  Platelets 150 - 440 K/uL 219 209 227   CMP Latest Ref Rng & Units 03/10/2018 03/09/2018 03/08/2018  Glucose 70 - 99 mg/dL 295(A150(H) 213(Y137(H) 865(H105(H)  BUN 8 - 23 mg/dL 84(O27(H) 96(E29(H) 95(M28(H)  Creatinine 0.61 - 1.24 mg/dL 8.41(L1.36(H) 2.44(W1.48(H) 1.02(V1.59(H)  Sodium 135 - 145 mmol/L 140 143 146(H)  Potassium 3.5 - 5.1 mmol/L 3.6 3.6 3.5  Chloride 98 - 111 mmol/L 95(L) 93(L) 94(L)  CO2 22 - 32 mmol/L 38(H) 39(H) 44(H)  Calcium 8.9 - 10.3 mg/dL 2.5(D8.4(L) 8.1(L) 8.2(L)  Total Protein 6.5 - 8.1 g/dL - - -  Total Bilirubin 0.3 - 1.2 mg/dL - - -  Alkaline Phos 38 - 126 U/L - - -  AST 15 - 41 U/L - - -  ALT 0 - 44 U/L - - -    Assessment/Plan:  67 y.o. male with gastroparesis complicated with active infection. Resolved. No nausea or vomiting, Tolerated diet and having bowel movement. From surgical standpoint,  diet progressed to regular today and may be discharged when medically appropriate.   Gae GallopEdgardo Cintrn-Daz, MD

## 2018-03-10 NOTE — Progress Notes (Signed)
Pharmacy Antibiotic Note  Alan Dillon is a 67 y.o. male admitted on 03/05/2018 with Ecoli bacteremia. Patient with left foot osteomyelitis s/p surgical debridement. Pharmacy has been consulted for cefazolin dosing. This is day #6 of IV Ancef which will likely require a 6 week course, some of which will be as an outpatient through a PICC line. This patient's creatinine has improved since this therapy was started. However the current dose remains appropriate.  Plan: Continue current dose of cefazolin 2g IV Q8hr.   Height: 6\' 1"  (185.4 cm) Weight: (!) 368 lb 14.4 oz (167.3 kg) IBW/kg (Calculated) : 79.9  Temp (24hrs), Avg:98.4 F (36.9 C), Min:97.5 F (36.4 C), Max:98.9 F (37.2 C)  Recent Labs  Lab 03/05/18 0646 03/05/18 0957 03/06/18 0559 03/06/18 1258 03/07/18 0321 03/07/18 0548 03/07/18 0554 03/08/18 0519 03/09/18 0427 03/10/18 0441  WBC 21.3*  --  12.5*  --  8.2  --   --   --  9.4 9.8  CREATININE 1.86*  --  1.95*  --   --   --  1.68* 1.59* 1.48* 1.36*  LATICACIDVEN 3.0* 1.9  --  0.9  --  1.2  --   --   --   --     Estimated Creatinine Clearance: 85.7 mL/min (A) (by C-G formula based on SCr of 1.36 mg/dL (H)).    Allergies  Allergen Reactions  . Bactrim [Sulfamethoxazole-Trimethoprim] Other (See Comments)    Caused kidney failure    Antimicrobials this admission: Zosyn 7/14  Vancomycin 7/14 Meropenem 7/14 >> 7/16  Cefazolin 7/16 >>   Dose adjustments this admission: N/A  Microbiology results: 7/14 BCx: Ecoli - S to cefazolin  7/14 UCx: no growth  7/15 MRSA PCR: negative   Thank you for allowing pharmacy to be a part of this patient's care.  Lowella Bandyodney D Mescal Flinchbaugh, PharmD 03/10/2018 12:04 PM

## 2018-03-10 NOTE — Plan of Care (Signed)
  Problem: Health Behavior/Discharge Planning: Goal: Ability to manage health-related needs will improve Outcome: Progressing Note:  Patient with wound vac to the L foot. Canister changed today, whole system to be swapped out tomorrow. Will continue to monitor drainage output. Alan FavreSteven M Little River Healthcaremhoff

## 2018-03-10 NOTE — Progress Notes (Signed)
[facility]  ROUNDING NOTE   Subjective:   Family at bedside.   UOP 2850  Creatinine 1.36 (1.48)  1/2NS at 1975mL/hr  Objective:  Vital signs in last 24 hours:  Temp:  [97.5 F (36.4 C)-98.9 F (37.2 C)] 98.8 F (37.1 C) (07/19 0321) Pulse Rate:  [68-95] 68 (07/19 0321) Resp:  [18-22] 18 (07/19 0321) BP: (154-181)/(71-92) 181/92 (07/19 0321) SpO2:  [95 %-99 %] 95 % (07/19 0734) Weight:  [167.3 kg (368 lb 14.4 oz)] 167.3 kg (368 lb 14.4 oz) (07/19 0329)  Weight change: 5.987 kg (13 lb 3.2 oz) Filed Weights   03/08/18 0433 03/09/18 0323 03/10/18 0329  Weight: (!) 161.8 kg (356 lb 11.2 oz) (!) 161.3 kg (355 lb 11.2 oz) (!) 167.3 kg (368 lb 14.4 oz)    Intake/Output: I/O last 3 completed shifts: In: 3768.8 [P.O.:620; I.V.:2948.8; IV Piggyback:200] Out: 4430 [Urine:4180; Drains:250]   Intake/Output this shift:  Total I/O In: 240 [P.O.:240] Out: -   Physical Exam: General: NAD   Head: [place]/AT  Eyes: Anicteric, PERRL  Neck: Supple, trachea midline  Lungs:  Clear to auscultation  Heart: Regular rate and rhythm  Abdomen:  Obese, +bowel sounds  Extremities:  bilateral peripheral edema. Left foot wound vac  Neurologic: Nonfocal, moving all four extremities  Skin: No lesions        Basic Metabolic Panel: Recent Labs  Lab 03/06/18 0559 03/07/18 0321 03/07/18 0554 03/08/18 0519 03/09/18 0427 03/10/18 0441  NA 142  --  148* 146* 143 140  K 3.6  --  3.5 3.5 3.6 3.6  CL 99  --  99 94* 93* 95*  CO2 32  --  40* 44* 39* 38*  GLUCOSE 148*  --  112* 105* 137* 150*  BUN 33*  --  31* 28* 29* 27*  CREATININE 1.95*  --  1.68* 1.59* 1.48* 1.36*  CALCIUM 7.8*  --  8.1* 8.2* 8.1* 8.4*  MG  --  2.6*  --   --   --   --   PHOS  --  2.9 3.0 2.8  --   --     Liver Function Tests: Recent Labs  Lab 03/05/18 0646 03/07/18 0321 03/07/18 0554 03/08/18 0519  AST 33 69*  --   --   ALT 18 32  --   --   ALKPHOS 81 62  --   --   BILITOT 0.7 0.8  --   --   PROT  8.3* 7.8  --   --   ALBUMIN 3.4* 2.7* 2.7* 2.6*   Recent Labs  Lab 03/05/18 0646  LIPASE 28   Recent Labs  Lab 03/07/18 0548  AMMONIA <9*    CBC: Recent Labs  Lab 03/05/18 0646 03/06/18 0559 03/07/18 0321 03/09/18 0427 03/10/18 0441  WBC 21.3* 12.5* 8.2 9.4 9.8  NEUTROABS 19.9*  --  6.4  --   --   HGB 12.6* 11.3* 11.1* 10.5* 10.6*  HCT 38.9* 34.6* 33.9* 31.8* 31.7*  MCV 87.0 86.8 87.2 86.8 86.7  PLT 272 205 227 209 219    Cardiac Enzymes: Recent Labs  Lab 03/05/18 0646 03/07/18 0321  CKTOTAL  --  768*  TROPONINI 0.03*  --     BNP: Invalid input(s): POCBNP  CBG: Recent Labs  Lab 03/08/18 1917 03/09/18 0935 03/09/18 1511 03/09/18 2055 03/10/18 0746  GLUCAP 94 123* 126* 159* 173*    Microbiology: Results for orders placed or performed during the hospital encounter of 03/05/18  Blood  Culture (routine x 2)     Status: Abnormal   Collection Time: 03/05/18  6:47 AM  Result Value Ref Range Status   Specimen Description   Final    BLOOD RIGHT FOREARM Performed at [facility], [address] [place], [place] [address]    Special Requests   Final    BOTTLES DRAWN AEROBIC AND ANAEROBIC Blood Culture adequate volume Performed at [facility], [address] [place], [place] [address]    Culture  Setup Time   Final    GRAM NEGATIVE RODS IN BOTH AEROBIC AND ANAEROBIC BOTTLES CRITICAL RESULT CALLED TO, READ BACK BY AND VERIFIED WITH: SHEEMA HALLAJI @ 1611 ON 03/05/2018 BY CAF Performed at [facility], [address] [place], [place] [address]    Culture (A)  Final    ESCHERICHIA COLI SUSCEPTIBILITIES PERFORMED ON PREVIOUS CULTURE WITHIN THE LAST 5 DAYS. Performed at [facility], [address], [place], [place] [address]    Report Status 03/07/2018 FINAL  Final  Blood Culture (routine x 2)     Status: Abnormal   Collection Time: 03/05/18  6:49 AM  Result Value Ref Range Status   Specimen Description   Final     BLOOD RIGHT HAND Performed at [facility], [address] [place], [place] [address]    Special Requests   Final    BOTTLES DRAWN AEROBIC AND ANAEROBIC Blood Culture results may not be optimal due to an excessive volume of blood received in culture bottles Performed at [facility], [address] [place], [place] [address]    Culture  Setup Time   Final    IN BOTH AEROBIC AND ANAEROBIC BOTTLES GRAM NEGATIVE RODS CRITICAL RESULT CALLED TO, READ BACK BY AND VERIFIED WITH: SHEEMA HALLAJI @ 1611 ON 03/05/2018 BY CAF Performed at [facility], [address], [place], [place] [address]    Culture ESCHERICHIA COLI (A)  Final   Report Status 03/07/2018 FINAL  Final   Organism ID, Bacteria ESCHERICHIA COLI  Final      Susceptibility   Escherichia coli - MIC*    AMPICILLIN >=32 RESISTANT Resistant     CEFAZOLIN <=4 SENSITIVE Sensitive     CEFEPIME <=1 SENSITIVE Sensitive     CEFTAZIDIME <=1 SENSITIVE Sensitive     CEFTRIAXONE <=1 SENSITIVE Sensitive     CIPROFLOXACIN >=4 RESISTANT Resistant     GENTAMICIN <=1 SENSITIVE Sensitive     IMIPENEM <=0.25 SENSITIVE Sensitive     TRIMETH/SULFA >=320 RESISTANT Resistant     AMPICILLIN/SULBACTAM 16 INTERMEDIATE Intermediate     PIP/TAZO <=4 SENSITIVE Sensitive     Extended ESBL NEGATIVE Sensitive     * ESCHERICHIA COLI  Blood Culture ID Panel (Reflexed)     Status: Abnormal   Collection Time: 03/05/18  6:49 AM  Result Value Ref Range Status   Enterococcus species NOT DETECTED NOT DETECTED Final   Listeria monocytogenes NOT DETECTED NOT DETECTED Final   Staphylococcus species NOT DETECTED NOT DETECTED Final   Staphylococcus aureus NOT DETECTED NOT DETECTED Final   Streptococcus species NOT DETECTED NOT DETECTED Final   Streptococcus agalactiae NOT DETECTED NOT DETECTED Final   Streptococcus pneumoniae NOT DETECTED NOT DETECTED Final   Streptococcus pyogenes NOT DETECTED NOT DETECTED Final   Acinetobacter baumannii  NOT DETECTED NOT DETECTED Final   Enterobacteriaceae species DETECTED (A) NOT DETECTED Final    Comment: Enterobacteriaceae represent a large family of gram-negative bacteria, not a single  organism. CRITICAL RESULT CALLED TO, READ BACK BY AND VERIFIED WITH: SHEEMA HALLAJI @ 1611 ON 03/05/2018 BY CAF    Enterobacter cloacae complex NOT DETECTED NOT DETECTED Final   Escherichia coli DETECTED (A) NOT DETECTED Final    Comment: CRITICAL RESULT CALLED TO, READ BACK BY AND VERIFIED WITH: SHEEMA HALLAJI @ 1611 ON 03/05/2018 BY CAF    Klebsiella oxytoca NOT DETECTED NOT DETECTED Final   Klebsiella pneumoniae NOT DETECTED NOT DETECTED Final   Proteus species NOT DETECTED NOT DETECTED Final   Serratia marcescens NOT DETECTED NOT DETECTED Final   Carbapenem resistance NOT DETECTED NOT DETECTED Final   Haemophilus influenzae NOT DETECTED NOT DETECTED Final   Neisseria meningitidis NOT DETECTED NOT DETECTED Final   Pseudomonas aeruginosa NOT DETECTED NOT DETECTED Final   Candida albicans NOT DETECTED NOT DETECTED Final   Candida glabrata NOT DETECTED NOT DETECTED Final   Candida krusei NOT DETECTED NOT DETECTED Final   Candida parapsilosis NOT DETECTED NOT DETECTED Final   Candida tropicalis NOT DETECTED NOT DETECTED Final    Comment: Performed at [facility], [address] [place], [place] [address]  Urine culture     Status: None   Collection Time: 03/05/18  7:19 AM  Result Value Ref Range Status   Specimen Description   Final    URINE, RANDOM Performed at [facility], [address], [place], [place] [address]    Special Requests   Final    NONE Performed at [facility], [address] [place], [place] [address]    Culture   Final    NO GROWTH Performed at [facility], [address] [place], [place] [address]    Report Status 03/06/2018 FINAL  Final  MRSA PCR Screening     Status: None   Collection Time: 03/06/18 12:58 PM  Result Value Ref  Range Status   MRSA by PCR NEGATIVE NEGATIVE Final    Comment:        The GeneXpert MRSA Assay (FDA approved for NASAL specimens only), is one component of a comprehensive MRSA colonization surveillance program. It is not intended to diagnose MRSA infection nor to guide or monitor treatment for MRSA infections. Performed at [facility], [address] [place], [place] [address]   Culture, blood (single) w Reflex to ID Panel     Status: None (Preliminary result)   Collection Time: 03/07/18  3:41 PM  Result Value Ref Range Status   Specimen Description BLOOD BLOOD LEFT HAND  Final   Special Requests   Final    BOTTLES DRAWN AEROBIC AND ANAEROBIC Blood Culture results may not be optimal due to an inadequate volume of blood received in culture bottles   Culture   Final    NO GROWTH 3 DAYS Performed at [facility], [address] [place], [place] [address]    Report Status PENDING  Incomplete  C difficile quick scan w PCR reflex     Status: None   Collection Time: 03/09/18  2:44 PM  Result Value Ref Range Status   C Diff antigen NEGATIVE NEGATIVE Final   C Diff toxin NEGATIVE NEGATIVE Final   C Diff interpretation No C. difficile detected.  Final    Comment: Performed at [facility], [address] [place], [place] [address]    Coagulation Studies: No results for input(s): LABPROT, INR in the last 72 hours.  Urinalysis: No results for input(s): COLORURINE, LABSPEC, PHURINE, GLUCOSEU, HGBUR, BILIRUBINUR, KETONESUR,  PROTEINUR, UROBILINOGEN, NITRITE, LEUKOCYTESUR in the last 72 hours.  Invalid input(s): APPERANCEUR    Imaging: Dg Abd 1 View  Result Date: 03/08/2018 CLINICAL DATA:  Ileus  Follow up , SBO EXAM: ABDOMEN - 1 VIEW COMPARISON:  [id number] FINDINGS: Eighteen No dilated loops of large or small bowel. Gas expanded stomach. No pathologic calcifications. No organomegaly. No aggressive osseous lesion. No intraperitoneal free air. IMPRESSION: No  acute abdominal findings. Electronically Signed   By: Genevive Bi M.D.   On: 03/08/2018 12:19   Dg Kayleen Memos W/water Sol Cm  Result Date: 03/09/2018 CLINICAL DATA:  Possible ileus.  NG tube pole yesterday. EXAM: UPPER GI SERIES WITHOUT KUB TECHNIQUE: Routine upper GI series was performed with water-soluble barium. FLUOROSCOPY TIME:  Fluoroscopy Time:  0 minutes 48 seconds Radiation Exposure Index (if provided by the fluoroscopic device): 28 mGy Number of Acquired Spot Images: 7 COMPARISON:  03/08/2018. FINDINGS: Esophagus is widely patent. Delay of contrast noted in the stomach. Stomach and duodenum are patent. Contrast empties slowly into the small bowel. Repeat KUB can be obtained in a.m. to demonstrate continued passage of contrast. IMPRESSION: Esophagus, stomach, duodenum, proximal small bowel are patent. There is delayed emptying from the stomach. Follow-up exam can be obtained to demonstrate continued emptying. Electronically Signed   By: Maisie Fus  Register   On: 03/09/2018 13:01     Medications:   . sodium chloride Stopped (03/10/18 0926)  .  ceFAZolin (ANCEF) IV Stopped (03/10/18 0550)   . apixaban  5 mg Oral BID  . brimonidine  1 drop Both Eyes BID  . ferrous sulfate  325 mg Oral BID  . insulin aspart  0-15 Units Subcutaneous TID WC  . ipratropium-albuterol  3 mL Nebulization TID  . mouth rinse  15 mL Mouth Rinse BID  . metoprolol tartrate  25 mg Oral BID  . multivitamin with minerals  1 tablet Oral Daily   acetaminophen **OR** acetaminophen, ondansetron **OR** ondansetron (ZOFRAN) IV, oxyCODONE-acetaminophen, phenol, promethazine  Assessment/ Plan:  Mr. Alan Dillon is a 67 y.o. white male with osteomyelitis, hypertension, diabetes mellitus type II, atrial fibrillation, history of acute kidney injury requiring hemodialysis, obstructive sleep apnea , who was admitted to [facility] on 03/05/2018 for Ulcer of left foot   1. Acute renal failure on chronic kidney disease stage III with  proteinuria: History of acute renal failure requiring hemodialysis.   Chronic kidney disease secondary diabetes, hypertension and acute injury with limited recovery.  Acute kidney failure secondary to sepsis, bacteremia, E. Coli UTI - Nonoliguric urine output.  - Discotninue IV fluids  - Holding bumetanide.   2. Hypertension:  Home regimen of bumetanide, carvedilol, and hydralazine.   3. Diabetes mellitus type II with chronic kidney disease: insulin dependent.  Complication of left lower extremity ulcer. Hemoglobin A1c 7.3% - holding metformin - Appreciate podiatry and vascular input.     LOS: 5 Alan Dillon 7/19/201911:37 AM

## 2018-03-10 NOTE — Care Management Important Message (Signed)
Copy of signed IM left with patient in room.  

## 2018-03-10 NOTE — Progress Notes (Signed)
No overnight head-to-toe nursing assessment was charted on this patient for some reason. Will chart one for dayshift now. Alan Dillon   

## 2018-03-10 NOTE — Progress Notes (Addendum)
[facility] [place] [facility]   PATIENT NAME: Alan Dillon    MR#:  [id number]  DATE OF BIRTH:  1951-03-15  SUBJECTIVE:   Patient states he is doing fine. Pain is well-controlled. He has been tolerating a regular diet without any issues. No nausea or vomiting. Diarrhea has improved.  REVIEW OF SYSTEMS:    Review of Systems  Constitutional: Negative for chills and fever.  HENT: Negative for congestion and tinnitus.   Eyes: Negative for blurred vision and double vision.  Respiratory: Negative for cough, shortness of breath and wheezing.   Cardiovascular: Negative for chest pain, orthopnea and PND.  Gastrointestinal: Negative for abdominal pain, diarrhea, nausea and vomiting.  Genitourinary: Negative for dysuria and hematuria.  Skin: Negative.   Neurological: Positive for weakness. Negative for dizziness, sensory change and focal weakness.  All other systems reviewed and are negative.   Nutrition: regular Tolerating Diet: yes Tolerating PT: Await Eval.   DRUG ALLERGIES:   Allergies  Allergen Reactions  . Bactrim [Sulfamethoxazole-Trimethoprim] Other (See Comments)    Caused kidney failure    VITALS:  Blood pressure (!) 181/92, pulse 68, temperature 98.8 F (37.1 C), temperature source Oral, resp. rate 18, height 6\' 1"  (1.854 m), weight (!) 167.3 kg (368 lb 14.4 oz), SpO2 95 %.  PHYSICAL EXAMINATION:   Physical Exam  GENERAL:  67 y.o.-year-old morbidly obese patient lying in bed in NAD.  EYES: PERRLA, EOMI, no scleral icterus HEENT: [place]/AT NECK:  Supple, no JVD. No thyroid enlargement, no tenderness.  LUNGS: Normal work of breathing, no wheezes or crackles, Nichols in place CARDIOVASCULAR: RRR, no murmurs, rubs, or gallops.  ABDOMEN: obese, +BS, soft, protuberant, non-tender EXTREMITIES: No cyanosis, clubbing, 1-2+ pitting edema to the mid-shin bilaterally NEUROLOGIC: CN 2-12 grossly intact, no focal deficits PSYCHIATRIC: The patient is alert and  oriented x 3, appropriate affect SKIN: No obvious rash, lesion, or ulcer. Wound vac present on left lower extremity. +venous stasis changes bilaterally   LABORATORY PANEL:   CBC Recent Labs  Lab 03/10/18 0441  WBC 9.8  HGB 10.6*  HCT 31.7*  PLT 219   ------------------------------------------------------------------------------------------------------------------  Chemistries  Recent Labs  Lab 03/07/18 0321  03/10/18 0441  NA  --    < > 140  K  --    < > 3.6  CL  --    < > 95*  CO2  --    < > 38*  GLUCOSE  --    < > 150*  BUN  --    < > 27*  CREATININE  --    < > 1.36*  CALCIUM  --    < > 8.4*  MG 2.6*  --   --   AST 69*  --   --   ALT 32  --   --   ALKPHOS 62  --   --   BILITOT 0.8  --   --    < > = values in this interval not displayed.   ------------------------------------------------------------------------------------------------------------------  Cardiac Enzymes Recent Labs  Lab 03/05/18 0646  TROPONINI 0.03*   ------------------------------------------------------------------------------------------------------------------  RADIOLOGY:  Antony Hasteg Ugi W/water Sol Cm  Result Date: 03/09/2018 CLINICAL DATA:  Possible ileus.  NG tube pole yesterday. EXAM: UPPER GI SERIES WITHOUT KUB TECHNIQUE: Routine upper GI series was performed with water-soluble barium. FLUOROSCOPY TIME:  Fluoroscopy Time:  0 minutes 48 seconds Radiation Exposure Index (if provided by the fluoroscopic device): 28 mGy Number of Acquired Spot Images: 7 COMPARISON:  03/08/2018. FINDINGS: Esophagus is widely patent. Delay of contrast noted in the stomach. Stomach and duodenum are patent. Contrast empties slowly into the small bowel. Repeat KUB can be obtained in a.m. to demonstrate continued passage of contrast. IMPRESSION: Esophagus, stomach, duodenum, proximal small bowel are patent. There is delayed emptying from the stomach. Follow-up exam can be obtained to demonstrate continued emptying.  Electronically Signed   By: Maisie Fus  Register   On: 03/09/2018 13:01     ASSESSMENT AND PLAN:   67 year old male with past medical history of morbid obesity, diabetes, obstructive sleep apnea, hypertension, chronic kidney disease stage III, chronic atrial fibrillation who presented to the hospital due to generalized weakness, lethargy and noted to have sepsis secondary to left foot osteomyelitis.  1.  Chronic left foot osteomyelitis- initially presenting with sepsis, but this has resolved. s/p I&D left heel with debridement of infected bone on 7/17 with Dr. Alberteen Spindle. - initial blood cultures positive for E. Coli sensitive to Ancef, repeat blood cultures were negative - bone culture taken during surgery was sent to path in formalin, so unable to obtain adequate bone cultures - continue ancef - discussed with Dr. Ninetta Lights (ID) who recommends 6 weeks of Ancef through a PICC. Patient can f/u in their clinic on discharge. - plan to change wound vac on 7/20 - wound vac placement every 3 days on discharge  2.  Acute on chronic respiratory failure with hypercapnia- secondary to underlying obstructive sleep apnea and OHS. Improved. Patient developed worsening hypercapnia and was transferred to the ICU, placed on BiPAP and hypercapnia has improved.  Patient's mental status is also improved.  Patient off BiPAP now and has been stable. - currently on home 2L O2 by [place]  3.  Ileus- resolved and now tolerating regular diet. -Patient had NG tube placed for 24 to 48 hours and had significant drainage from it. - General surgery has signed off. Upper GI series 7/18 without signs of gastric outlet obstruction, but was more consistent with gastroparesis.   4. Chronic diastolic CHF- stable, not in an exacerbation - holding bumetanide - IVFs stopped today - IV metoprolol switched back to home coreg today  5. CKD stage III- Cr continues to improve. - nephrology following - continue holding bumetanide - IVF  discontinued today - Renal ultrasound showing no evidence of obstruction or hydronephrosis.  6. Hypertension- BPs have been high. - continue coreg - restart home hydralazine 25mg  tid today  7.  Diabetes type 2 without complication-blood sugars well controlled - moderate SSI  8.  Hyperlipidemia - continue lipitor  9. Chronic A. Fib - rate controlled - continue eliquis - lopressor IV for rate control, plan to switch back to home coreg tomorrow  Care Management consult for SNF placement.  All the records are reviewed and case discussed with Care Management/Social Worker. Management plans discussed with the patient, family and they are in agreement.  CODE STATUS: Full code  DVT Prophylaxis: eliquis  TOTAL Critical Care TIME TAKING CARE OF THIS PATIENT: 30 minutes.   POSSIBLE D/C UNCLEAR, POSSIBLY 1-2 DAYS, DEPENDING ON CLINICAL CONDITION.   Jinny Blossom Mayo M.D on 03/10/2018 at 12:44 PM  Between 7am to 6pm - Pager - [phone]  After 6pm go to [email or web address] - Social research officer, government  [facility]  Office  [phone]  CC: Primary care physician; Tsosie Billing, MD

## 2018-03-10 NOTE — Progress Notes (Signed)
[facility]                                                      Patient Demographics  Alan Dillon, is a 67 y.o. male   MRN: [id number]   DOB - 05/08/1951  Admit Date - 03/05/2018    Outpatient Primary MD for the patient is Tsosie Billing, MD  Consult requested in the Hospital by [facility], Allyn Kenner, MD, On 03/10/2018   With History of -  Past Medical History:  Diagnosis Date  . Atrial fibrillation, chronic (HCC)    On apixaban  . CKD (chronic kidney disease), stage III (HCC)   . Diabetes mellitus type 2 in obese (HCC)   . 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 History of hemodialysis    Bactrim mediated  Acute renal failure  . Hypertension   . Morbid obesity with BMI of 45.0-49.9, adult (HCC)   . Osteomyelitis of ankle or foot, acute, left (HCC)    chronic      Past Surgical History:  Procedure Laterality Date  . APPLICATION OF WOUND VAC Left 03/08/2018   Procedure: APPLICATION OF WOUND VAC;  Surgeon: Linus Galas, DPM;  Location: [facility] ORS;  Service: Podiatry;  Laterality: Left;  . DEBRIDEMENT  FOOT Left   . IRRIGATION AND DEBRIDEMENT FOOT Left 03/08/2018   Procedure: IRRIGATION AND DEBRIDEMENT FOOT- LEFT HEEL;  Surgeon: Linus Galas, DPM;  Location: [facility] ORS;  Service: Podiatry;  Laterality: Left;    in for   Chief Complaint  Patient presents with  . Weakness     HPI  Alan Dillon  is a 67 y.o. male, 2 days status post excisional debridement of large plantar calcaneal wound with removal of infected skin and soft tissue and bone from the region.  Dr. Linus Galas perform the surgery and placed antibiotic beads along with Mepitel cover and a wound VAC to try to promote antibiosis locally as well as healing to the wound.    Review of Systems: Patient is alert well oriented and pleasant and  is with his wife today  In addition to the HPI above,  No Fever-chills, No Headache, No changes with Vision or hearing, No problems swallowing food or Liquids, No Chest pain, Cough or Shortness of Breath, No Abdominal pain, No Nausea or Vommitting, Bowel movements are regular, No Blood in stool or Urine, No dysuria, No new skin rashes or bruises, No new joints pains-aches,  No new weakness, tingling, numbness in any extremity, No recent weight gain or loss, No polyuria, polydypsia or polyphagia, No significant Mental Stressors. Patient is morbidly obese A full 10 point Review of Systems was done, except as stated above, all other Review of Systems were negative.   Social History Social History   Tobacco Use  . Smoking status: Never Smoker  . Smokeless tobacco: Never Used  Substance Use Topics  . Alcohol use: Not on file    Family History Family History  Problem Relation Age of Onset  . Stroke Mother   . Diabetes Father     Prior to Admission medications   Medication Sig Start Date End Date Taking? Authorizing Provider  acetaminophen (TYLENOL) 500 MG tablet Take 500 mg by mouth every 6 (six) hours as needed  for mild pain or fever.   Yes [provider]  albuterol (PROVENTIL HFA;VENTOLIN HFA) 108 (90 Base) MCG/ACT inhaler Inhale 2 puffs into the lungs 2 (two) times daily.   Yes [provider]  ammonium lactate (LAC-HYDRIN) 12 % lotion Apply 1 application topically as needed for dry skin (on legs).   Yes [provider]  apixaban (ELIQUIS) 5 MG TABS tablet Take 5 mg by mouth 2 (two) times daily.   Yes [provider]  atorvastatin (LIPITOR) 10 MG tablet Take 10 mg by mouth every evening.   Yes [provider]  brimonidine (ALPHAGAN) 0.2 % ophthalmic solution Place 1 drop into both eyes 2 (two) times daily.   Yes [provider]  bumetanide (BUMEX) 2 MG tablet Take 2 mg by mouth 2 (two) times daily.   Yes [provider]  carvedilol (COREG) 12.5 MG tablet Take 12.5 mg by mouth 2 (two) times daily.   Yes [provider]  cetirizine (ZYRTEC) 10 MG tablet Take 10 mg by mouth daily.   Yes [provider]  Cholecalciferol (D3-1000) 1000 units capsule Take 1,000 Units by mouth daily.   Yes [provider]  clotrimazole-betamethasone (LOTRISONE) cream Apply 1 application topically 2 (two) times daily as needed (to skin folds).   Yes [provider]  famotidine (PEPCID) 20 MG tablet Take 20 mg by mouth every evening.    Yes [provider]  ferrous sulfate 325 (65 FE) MG tablet Take 325 mg by mouth 2 (two) times daily.   Yes [provider]  gabapentin (NEURONTIN) 300 MG capsule Take 300 mg by mouth 2 (two) times daily.   Yes [provider]  hydrALAZINE (APRESOLINE) 25 MG tablet Take 25 mg by mouth 3 (three) times daily.   Yes [provider]  insulin aspart (NOVOLOG FLEXPEN) 100 UNIT/ML FlexPen Inject 36 Units into the skin 3 (three) times daily before meals. According to sliding scale if sugars above 150.   Yes [provider]  Insulin Degludec (TRESIBA FLEXTOUCH) 200 UNIT/ML SOPN Inject 56 Units into the skin 2 (two) times daily.   Yes [provider]  metFORMIN (GLUCOPHAGE-XR) 500 MG 24 hr tablet Take 500 mg by mouth 2 (two) times daily.   Yes [provider]  Multiple Vitamins-Minerals (MULTIVITAMIN WITH MINERALS) tablet Take 1 tablet by mouth daily.   Yes [provider]  Probiotic Product (PROBIOTIC PO) Take 1 tablet by mouth daily.   Yes [provider]    Anti-infectives (From admission, onward)   Start     Dose/Rate Route Frequency Ordered Stop   03/08/18 1813  gentamicin (GARAMYCIN) injection  Status:  Discontinued       As needed 03/08/18 1814 03/08/18 1853   03/08/18 1800  vancomycin (VANCOCIN) powder  Status:  Discontinued       As needed 03/08/18 1803 03/08/18 1853    03/07/18 1400  ceFAZolin (ANCEF) IVPB 2g/100 mL premix     2 g 200 mL/hr over 30 Minutes Intravenous Every 8 hours 03/07/18 1201     03/05/18 1800  meropenem (MERREM) 1 g in sodium chloride 0.9 % 100 mL IVPB  Status:  Discontinued     1 g 200 mL/hr over 30 Minutes Intravenous Every 8 hours 03/05/18 1622 03/07/18 1055   03/05/18 1430  vancomycin (VANCOCIN) 1,750 mg in sodium chloride 0.9 % 500 mL IVPB  Status:  Discontinued     1,750 mg 250 mL/hr over 120 Minutes Intravenous  Every 24 hours 03/05/18 1351 03/05/18 1620   03/05/18 1400  piperacillin-tazobactam (ZOSYN) IVPB 3.375 g  Status:  Discontinued     3.375 g 12.5 mL/hr over 240 Minutes Intravenous Every 8 hours 03/05/18 1334 03/05/18 1620   03/05/18 0715  piperacillin-tazobactam (ZOSYN) IVPB 3.375 g     3.375 g 100 mL/hr over 30 Minutes Intravenous  Once 03/05/18 0712 03/05/18 0745   03/05/18 0715  vancomycin (VANCOCIN) IVPB 1000 mg/200 mL premix     1,000 mg 200 mL/hr over 60 Minutes Intravenous  Once 03/05/18 0712 03/05/18 0837      Scheduled Meds: . apixaban  5 mg Oral BID  . brimonidine  1 drop Both Eyes BID  . ferrous sulfate  325 mg Oral BID  . insulin aspart  0-15 Units Subcutaneous TID WC  . ipratropium-albuterol  3 mL Nebulization TID  . mouth rinse  15 mL Mouth Rinse BID  . metoprolol tartrate  25 mg Oral BID  . multivitamin with minerals  1 tablet Oral Daily   Continuous Infusions: . sodium chloride Stopped (03/10/18 0926)  .  ceFAZolin (ANCEF) IV Stopped (03/10/18 0550)   PRN Meds:.acetaminophen **OR** acetaminophen, ondansetron **OR** ondansetron (ZOFRAN) IV, oxyCODONE-acetaminophen, phenol, promethazine  Allergies  Allergen Reactions  . Bactrim [Sulfamethoxazole-Trimethoprim] Other (See Comments)    Caused kidney failure    Physical Exam  Vitals  Blood pressure (!) 181/92, pulse 68, temperature 98.8 F (37.1 C), temperature source Oral, resp. rate 18, height 6\' 1"  (1.854 m), weight (!) 167.3 kg (368 lb  14.4 oz), SpO2 95 %.  Lower Extremity exam: The wound VAC is intact and functional.  The cancer is about half full so I will  have them change the canister today.  Of that wound VAC is position well and functional with no drainage or leakage.  There is a small ulceration to the great toe as well. White count has come down considerably. Data Review  CBC Recent Labs  Lab 03/05/18 0646 03/06/18 0559 03/07/18 0321 03/09/18 0427 03/10/18 0441  WBC 21.3* 12.5* 8.2 9.4 9.8  HGB 12.6* 11.3* 11.1* 10.5* 10.6*  HCT 38.9* 34.6* 33.9* 31.8* 31.7*  PLT 272 205 227 209 219  MCV 87.0 86.8 87.2 86.8 86.7  MCH 28.2 28.4 28.4 28.6 29.0  MCHC 32.5 32.7 32.6 32.9 33.5  RDW 19.2* 19.0* 19.3* 18.4* 18.9*  LYMPHSABS 0.4*  --  0.6*  --   --   MONOABS 0.9  --  1.1*  --   --   EOSABS 0.0  --  0.1  --   --   BASOSABS 0.1  --  0.0  --   --    ------------------------------------------------------------------------------------------------------------------  Chemistries  Recent Labs  Lab 03/05/18 0646 03/06/18 0559 03/07/18 0321 03/07/18 0554 03/08/18 0519 03/09/18 0427 03/10/18 0441  NA 141 142  --  148* 146* 143 140  K 3.9 3.6  --  3.5 3.5 3.6 3.6  CL 101 99  --  99 94* 93* 95*  CO2 28 32  --  40* 44* 39* 38*  GLUCOSE 173* 148*  --  112* 105* 137* 150*  BUN 29* 33*  --  31* 28* 29* 27*  CREATININE 1.86* 1.95*  --  1.68* 1.59* 1.48* 1.36*  CALCIUM 8.4* 7.8*  --  8.1* 8.2* 8.1* 8.4*  MG  --   --  2.6*  --   --   --   --   AST 33  --  69*  --   --   --   --  ALT 18  --  32  --   --   --   --   ALKPHOS 81  --  62  --   --   --   --   BILITOT 0.7  --  0.8  --   --   --   --    ------------------------------------------------------------------------------------------------------------------ estimated creatinine clearance is 85.7 mL/min (A) (by C-G formula based on SCr of 1.36 mg/dL  (H)). ------------------------------------------------------------------------------------------------------------------ No results for input(s): TSH, T4TOTAL, T3FREE, THYROIDAB in the last 72 hours.  Invalid input(s): FREET3 Urinalysis    Component Value Date/Time   COLORURINE YELLOW (A) 03/05/2018 0719   APPEARANCEUR HAZY (A) 03/05/2018 0719   LABSPEC 1.014 03/05/2018 0719   PHURINE 5.0 03/05/2018 0719   GLUCOSEU NEGATIVE 03/05/2018 0719   HGBUR MODERATE (A) 03/05/2018 0719   BILIRUBINUR NEGATIVE 03/05/2018 0719   KETONESUR NEGATIVE 03/05/2018 0719   PROTEINUR 100 (A) 03/05/2018 0719   NITRITE NEGATIVE 03/05/2018 0719   LEUKOCYTESUR NEGATIVE 03/05/2018 0719     Imaging results:    Assessment & Plan: Overall think is fairly stable with this.  I do feel however he is going to need rehab if he is going to get this wound healed up.  I think if he goes home he will be challenged too much to use the foot foot pressure on it and this will likely can cause continued breakdown and increased risk of infection.  Wrote consult for care management and for nursing to change the wound VAC canister I will change the wound VAC itself tomorrow.   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

## 2018-03-10 NOTE — Progress Notes (Signed)
Patient has refused bipap for the night. 

## 2018-03-10 NOTE — Progress Notes (Signed)
Changed the wound vacuum canister at this time, as ordered. It was exactly only half full (250 cc). Output charted. Bloody drainage. Disposed of in biohazard bin. No seal leakage. Will continue to monitor output. Jari FavreSteven M [facility] Hospital[facility]

## 2018-03-11 ENCOUNTER — Inpatient Hospital Stay: Payer: Self-pay

## 2018-03-11 LAB — CBC
HEMATOCRIT: 33.9 % — AB (ref 40.0–52.0)
Hemoglobin: 11.2 g/dL — ABNORMAL LOW (ref 13.0–18.0)
MCH: 28.4 pg (ref 26.0–34.0)
MCHC: 33.1 g/dL (ref 32.0–36.0)
MCV: 85.8 fL (ref 80.0–100.0)
Platelets: 256 10*3/uL (ref 150–440)
RBC: 3.95 MIL/uL — ABNORMAL LOW (ref 4.40–5.90)
RDW: 18.1 % — AB (ref 11.5–14.5)
WBC: 9.2 10*3/uL (ref 3.8–10.6)

## 2018-03-11 LAB — BASIC METABOLIC PANEL
ANION GAP: 8 (ref 5–15)
BUN: 25 mg/dL — ABNORMAL HIGH (ref 8–23)
CALCIUM: 8.5 mg/dL — AB (ref 8.9–10.3)
CHLORIDE: 97 mmol/L — AB (ref 98–111)
CO2: 33 mmol/L — AB (ref 22–32)
Creatinine, Ser: 1.23 mg/dL (ref 0.61–1.24)
GFR calc Af Amer: >60 mL/min (ref >60)
GFR calc non Af Amer: 59 mL/min — ABNORMAL LOW (ref >60)
GLUCOSE: 200 mg/dL — AB (ref 70–99)
POTASSIUM: 4 mmol/L (ref 3.5–5.1)
Sodium: 138 mmol/L (ref 135–145)

## 2018-03-11 LAB — GLUCOSE, CAPILLARY
GLUCOSE-CAPILLARY: 207 mg/dL — AB (ref 70–99)
GLUCOSE-CAPILLARY: 248 mg/dL — AB (ref 70–99)
Glucose-Capillary: 198 mg/dL — ABNORMAL HIGH (ref 70–99)
Glucose-Capillary: 208 mg/dL — ABNORMAL HIGH (ref 70–99)

## 2018-03-11 MED ORDER — INSULIN ASPART 100 UNIT/ML ~~LOC~~ SOLN
0.0000 [IU] | Freq: Every day | SUBCUTANEOUS | Status: DC
  Administered 2018-03-11 – 2018-03-12 (×2): 2 [IU] via SUBCUTANEOUS
  Administered 2018-03-13: 3 [IU] via SUBCUTANEOUS
  Filled 2018-03-11 (×4): qty 1

## 2018-03-11 MED ORDER — BUMETANIDE 1 MG PO TABS
2.0000 mg | ORAL_TABLET | Freq: Every day | ORAL | Status: DC
  Administered 2018-03-12 – 2018-03-14 (×3): 2 mg via ORAL
  Filled 2018-03-11 (×3): qty 2

## 2018-03-11 MED ORDER — IPRATROPIUM-ALBUTEROL 0.5-2.5 (3) MG/3ML IN SOLN: 3.0000 mL | Freq: Four times a day (QID) | RESPIRATORY_TRACT | Status: DC | PRN

## 2018-03-11 MED ORDER — INSULIN ASPART 100 UNIT/ML ~~LOC~~ SOLN
0.0000 [IU] | Freq: Three times a day (TID) | SUBCUTANEOUS | Status: DC
  Administered 2018-03-12 – 2018-03-13 (×6): 5 [IU] via SUBCUTANEOUS
  Administered 2018-03-14 (×2): 8 [IU] via SUBCUTANEOUS
  Filled 2018-03-11 (×7): qty 1

## 2018-03-11 NOTE — Evaluation (Signed)
Physical Therapy Evaluation Patient Details Name: Alan Dillon MRN: [id number] DOB: [birth year] Today's Date: 03/11/2018   History of Present Illness  67 yo male with L heel I and D on 7/17 which was septic was referred to PT for mobility and limited to NWB on LLE.  Has had R heel wound for 4 years, B Charcot foot changes for many years.  PMHx:  SBO, HD, CKD, osteomyelitis, atherosclerosis, subacute rib fractures, a-fib, L5 pars fracture,   Clinical Impression  Pt was seen for evaluation of mobility with struggle to get his hips moving directionally toward HOB.  Pt and PT agreed to adding a trapeze bar to his bed to assist with independence of movement, and will continue to work on shifting transfers to bed with either AP or lateral sliding direction.  He is expecting to go to SNF and will work to shorten his stay.    Follow Up Recommendations SNF    Equipment Recommendations  None recommended by PT    Recommendations for Other Services       Precautions / Restrictions Precautions Precautions: Fall Restrictions Weight Bearing Restrictions: Yes LLE Weight Bearing: Non weight bearing      Mobility  Bed Mobility Overal bed mobility: Needs Assistance Bed Mobility: Supine to Sit;Sit to Supine     Supine to sit: Mod assist Sit to supine: Mod assist   General bed mobility comments: assisted trunk to sit up and legs back to bed  Transfers Overall transfer level: Needs assistance Equipment used: Rolling walker (2 wheeled) Transfers: Sit to/from Stand;Lateral/Scoot Transfers Sit to Stand: Total assist        Lateral/Scoot Transfers: Max assist;From elevated surface General transfer comment: pt has numbness on R leg and foot with poor use of LE for leverage to scoot or attempt standing  Ambulation/Gait             General Gait Details: unable  Stairs            Wheelchair Mobility    Modified Rankin (Stroke Patients Only)       Balance Overall balance  assessment: Needs assistance Sitting-balance support: Single extremity supported Sitting balance-Leahy Scale: Fair                                       Pertinent Vitals/Pain Pain Assessment: Faces Faces Pain Scale: Hurts even more Pain Location: L lower leg Pain Descriptors / Indicators: Operative site guarding;Tender Pain Intervention(s): Limited activity within patient's tolerance;Monitored during session;Repositioned    Home Living Family/patient expects to be discharged to:: Skilled nursing facility Living Arrangements: Spouse/significant other                    Prior Function Level of Independence: Independent with assistive device(s)         Comments: previously used SPC for gait     Hand Dominance   Dominant Hand: Right    Extremity/Trunk Assessment   Upper Extremity Assessment Upper Extremity Assessment: RUE deficits/detail RUE Deficits / Details: pain from IV in hand    Lower Extremity Assessment Lower Extremity Assessment: Generalized weakness    Cervical / Trunk Assessment Cervical / Trunk Assessment: Normal  Communication   Communication: No difficulties  Cognition Arousal/Alertness: Awake/alert Behavior During Therapy: Anxious Overall Cognitive Status: Within Functional Limits for tasks assessed  General Comments      Exercises                                                         LE strength was 4 to 4+      Assessment/Plan    PT Assessment Patient needs continued PT services  PT Problem List Decreased strength;Decreased range of motion;Decreased activity tolerance;Decreased balance;Decreased mobility;Decreased coordination;Decreased safety awareness;Decreased knowledge of use of DME;Obesity;Decreased skin integrity;Pain       PT Treatment Interventions DME instruction;Functional mobility training;Therapeutic activities;Therapeutic exercise;Balance  training;Neuromuscular re-education;Patient/family education    PT Goals (Current goals can be found in the Care Plan section)  Acute Rehab PT Goals Patient Stated Goal: to get stronger to walk again PT Goal Formulation: With patient Time For Goal Achievement: 03/25/18 Potential to Achieve Goals: Good    Frequency Min 2X/week   Barriers to discharge Decreased caregiver support home with his wife alone    Co-evaluation               AM-PAC PT "6 Clicks" Daily Activity  Outcome Measure Difficulty turning over in bed (including adjusting bedclothes, sheets and blankets)?: Unable Difficulty moving from lying on back to sitting on the side of the bed? : Unable Difficulty sitting down on and standing up from a chair with arms (e.g., wheelchair, bedside commode, etc,.)?: Unable Help needed moving to and from a bed to chair (including a wheelchair)?: A Lot Help needed walking in hospital room?: Total Help needed climbing 3-5 steps with a railing? : Total 6 Click Score: 7    End of Session Equipment Utilized During Treatment: Oxygen Activity Tolerance: Patient limited by fatigue Patient left: in bed;with call bell/phone within reach;with bed alarm set Nurse Communication: Mobility status PT Visit Diagnosis: Muscle weakness (generalized) (M62.81);Other abnormalities of gait and mobility (R26.89);Pain Pain - Right/Left: Left Pain - part of body: Ankle and joints of foot    Time: [phone](1148 [phone] [phone]) PT Time Calculation (min) (ACUTE ONLY): 14 min   Charges:   PT Evaluation $PT Eval Moderate Complexity: 1 Mod PT Treatments $Therapeutic Exercise: 8-22 mins $Therapeutic Activity: 23-37 mins $Neuromuscular Re-education: 8-22 mins   PT G Codes:   PT G-Codes **NOT FOR INPATIENT CLASS** Functional Assessment Tool Used: AM-PAC 6 Clicks Basic Mobility    Ivar DrapeRuth E Massey Ruhland 03/11/2018, 8:52 PM   Samul Dadauth Carlyle Achenbach, PT MS Acute Rehab Dept. [facility] [id number] [phone] and [facility] [phone]

## 2018-03-11 NOTE — Progress Notes (Signed)
Patient refusing bipap

## 2018-03-11 NOTE — Progress Notes (Signed)
[facility] Podiatry                                                      Patient Demographics  Alan Dillon, is a 67 y.o. male   MRN: [id number]   DOB - 06-13-1951  Admit Date - 03/05/2018    Outpatient Primary MD for the patient is Tsosie Billing, MD  With History of -  Past Medical History:  Diagnosis Date  . Atrial fibrillation, chronic (HCC)    On apixaban  . CKD (chronic kidney disease), stage III (HCC)   . Diabetes mellitus type 2 in obese (HCC)   . 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 History of hemodialysis    Bactrim mediated  Acute renal failure  . Hypertension   . Morbid obesity with BMI of 45.0-49.9, adult (HCC)   . Osteomyelitis of ankle or foot, acute, left (HCC)    chronic      Past Surgical History:  Procedure Laterality Date  . APPLICATION OF WOUND VAC Left 03/08/2018   Procedure: APPLICATION OF WOUND VAC;  Surgeon: Linus Galas, DPM;  Location: [facility] ORS;  Service: Podiatry;  Laterality: Left;  . DEBRIDEMENT  FOOT Left   . IRRIGATION AND DEBRIDEMENT FOOT Left 03/08/2018   Procedure: IRRIGATION AND DEBRIDEMENT FOOT- LEFT HEEL;  Surgeon: Linus Galas, DPM;  Location: [facility] ORS;  Service: Podiatry;  Laterality: Left;    in for   Chief Complaint  Patient presents with  . Weakness     HPI  Alan Dillon  is a 67 y.o. 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  Has a history of a large plantar diabetic ulceration on the heel that is been present for 10 months or more.  Conservative treatment has not been successful at closing it.  Dr. Linus Galas did surgery on him on Wednesday to debride the infected tissue including bone and put antibiotic beads covered with Mepitel and a wound VAC on the area.  Wound VAC still on and  functional.    Review of Systems he is alert and well-oriented today  In addition to the HPI above,  No Fever-chills, No Headache, No changes with Vision or hearing, No problems swallowing food or Liquids, No Chest pain, Cough or Shortness of Breath, No Abdominal pain, No Nausea or Vommitting, Bowel movements are regular, No Blood in stool or Urine, No dysuria, No new skin rashes or bruises, No new joints pains-aches,  No new weakness, tingling, numbness in any extremity, No recent weight gain or loss, No polyuria, polydypsia or polyphagia, No significant Mental Stressors.  A full 10 point Review of Systems was done, except as stated above, all other Review of Systems were negative.   Social History Social History   Tobacco Use  . Smoking status: Never Smoker  . Smokeless tobacco: Never Used  Substance Use Topics  . Alcohol use: Not on file    Family History Family History  Problem Relation Age of Onset  . Stroke Mother   . Diabetes Father     Prior to Admission medications   Medication Sig Start Date End Date Taking? Authorizing Provider  acetaminophen (TYLENOL) 500 MG tablet Take 500 mg by mouth every 6 (  six) hours as needed for mild pain or fever.   Yes [provider]  albuterol (PROVENTIL HFA;VENTOLIN HFA) 108 (90 Base) MCG/ACT inhaler Inhale 2 puffs into the lungs 2 (two) times daily.   Yes [provider]  ammonium lactate (LAC-HYDRIN) 12 % lotion Apply 1 application topically as needed for dry skin (on legs).   Yes [provider]  apixaban (ELIQUIS) 5 MG TABS tablet Take 5 mg by mouth 2 (two) times daily.   Yes [provider]  atorvastatin (LIPITOR) 10 MG tablet Take 10 mg by mouth every evening.   Yes [provider]  brimonidine (ALPHAGAN) 0.2 % ophthalmic solution Place 1 drop into both eyes 2 (two) times daily.   Yes [provider]  bumetanide (BUMEX) 2 MG tablet Take 2 mg by mouth 2 (two) times  daily.   Yes [provider]  carvedilol (COREG) 12.5 MG tablet Take 12.5 mg by mouth 2 (two) times daily.   Yes [provider]  cetirizine (ZYRTEC) 10 MG tablet Take 10 mg by mouth daily.   Yes [provider]  Cholecalciferol (D3-1000) 1000 units capsule Take 1,000 Units by mouth daily.   Yes [provider]  clotrimazole-betamethasone (LOTRISONE) cream Apply 1 application topically 2 (two) times daily as needed (to skin folds).   Yes [provider]  famotidine (PEPCID) 20 MG tablet Take 20 mg by mouth every evening.    Yes [provider]  ferrous sulfate 325 (65 FE) MG tablet Take 325 mg by mouth 2 (two) times daily.   Yes [provider]  gabapentin (NEURONTIN) 300 MG capsule Take 300 mg by mouth 2 (two) times daily.   Yes [provider]  hydrALAZINE (APRESOLINE) 25 MG tablet Take 25 mg by mouth 3 (three) times daily.   Yes [provider]  insulin aspart (NOVOLOG FLEXPEN) 100 UNIT/ML FlexPen Inject 36 Units into the skin 3 (three) times daily before meals. According to sliding scale if sugars above 150.   Yes [provider]  Insulin Degludec (TRESIBA FLEXTOUCH) 200 UNIT/ML SOPN Inject 56 Units into the skin 2 (two) times daily.   Yes [provider]  metFORMIN (GLUCOPHAGE-XR) 500 MG 24 hr tablet Take 500 mg by mouth 2 (two) times daily.   Yes [provider]  Multiple Vitamins-Minerals (MULTIVITAMIN WITH MINERALS) tablet Take 1 tablet by mouth daily.   Yes [provider]  Probiotic Product (PROBIOTIC PO) Take 1 tablet by mouth daily.   Yes [provider]    Anti-infectives (From admission, onward)   Start     Dose/Rate Route Frequency Ordered Stop   03/08/18 1813  gentamicin (GARAMYCIN) injection  Status:  Discontinued       As needed 03/08/18 1814 03/08/18 1853   03/08/18 1800  vancomycin (VANCOCIN) powder  Status:  Discontinued       As needed 03/08/18 1803  03/08/18 1853   03/07/18 1400  ceFAZolin (ANCEF) IVPB 2g/100 mL premix     2 g 200 mL/hr over 30 Minutes Intravenous Every 8 hours 03/07/18 1201     03/05/18 1800  meropenem (MERREM) 1 g in sodium chloride 0.9 % 100 mL IVPB  Status:  Discontinued     1 g 200 mL/hr over 30 Minutes Intravenous Every 8 hours 03/05/18 1622 03/07/18 1055   03/05/18 1430  vancomycin (VANCOCIN) 1,750 mg in sodium chloride 0.9 % 500 mL IVPB  Status:  Discontinued     1,750 mg 250 mL/hr  over 120 Minutes Intravenous Every 24 hours 03/05/18 1351 03/05/18 1620   03/05/18 1400  piperacillin-tazobactam (ZOSYN) IVPB 3.375 g  Status:  Discontinued     3.375 g 12.5 mL/hr over 240 Minutes Intravenous Every 8 hours 03/05/18 1334 03/05/18 1620   03/05/18 0715  piperacillin-tazobactam (ZOSYN) IVPB 3.375 g     3.375 g 100 mL/hr over 30 Minutes Intravenous  Once 03/05/18 0712 03/05/18 0745   03/05/18 0715  vancomycin (VANCOCIN) IVPB 1000 mg/200 mL premix     1,000 mg 200 mL/hr over 60 Minutes Intravenous  Once 03/05/18 0712 03/05/18 0837      Scheduled Meds: . apixaban  5 mg Oral BID  . brimonidine  1 drop Both Eyes BID  . carvedilol  12.5 mg Oral BID WC  . ferrous sulfate  325 mg Oral BID  . hydrALAZINE  25 mg Oral TID  . insulin aspart  0-15 Units Subcutaneous TID WC  . ipratropium-albuterol  3 mL Nebulization TID  . mouth rinse  15 mL Mouth Rinse BID  . multivitamin with minerals  1 tablet Oral Daily   Continuous Infusions: .  ceFAZolin (ANCEF) IV Stopped (03/11/18 0657)   PRN Meds:.acetaminophen **OR** acetaminophen, ondansetron **OR** ondansetron (ZOFRAN) IV, oxyCODONE-acetaminophen, phenol, promethazine  Allergies  Allergen Reactions  . Bactrim [Sulfamethoxazole-Trimethoprim] Other (See Comments)    Caused kidney failure    Physical Exam  Vitals  Blood pressure (!) 144/76, pulse 81, temperature 98.3 F (36.8 C), temperature source Oral, resp. rate 16, height 6\' 1"  (1.854 m), weight (!) 165.2 kg (364  lb 4.8 oz), SpO2 97 %.  Lower Extremity exam: Wound VAC is removed today and the Mepitel and the antibiotic beads are still intact in place and functional.  No surrounding cellulitis is apparent.  No heavy drainage is noted.  His white count is down considerably.  The wound VAC was reapplied at this time and set at 125 mmHg continuous.  He has done well and remaining nonweightbearing and will need to continue to do so.  Data Review  CBC Recent Labs  Lab 03/05/18 0646 03/06/18 0559 03/07/18 0321 03/09/18 0427 03/10/18 0441 03/11/18 0716  WBC 21.3* 12.5* 8.2 9.4 9.8 9.2  HGB 12.6* 11.3* 11.1* 10.5* 10.6* 11.2*  HCT 38.9* 34.6* 33.9* 31.8* 31.7* 33.9*  PLT 272 205 227 209 219 256  MCV 87.0 86.8 87.2 86.8 86.7 85.8  MCH 28.2 28.4 28.4 28.6 29.0 28.4  MCHC 32.5 32.7 32.6 32.9 33.5 33.1  RDW 19.2* 19.0* 19.3* 18.4* 18.9* 18.1*  LYMPHSABS 0.4*  --  0.6*  --   --   --   MONOABS 0.9  --  1.1*  --   --   --   EOSABS 0.0  --  0.1  --   --   --   BASOSABS 0.1  --  0.0  --   --   --    ------------------------------------------------------------------------------------------------------------------  Chemistries  Recent Labs  Lab 03/05/18 0646  03/07/18 0321 03/07/18 0554 03/08/18 0519 03/09/18 0427 03/10/18 0441 03/11/18 0716  NA 141   < >  --  148* 146* 143 140 138  K 3.9   < >  --  3.5 3.5 3.6 3.6 4.0  CL 101   < >  --  99 94* 93* 95* 97*  CO2 28   < >  --  40* 44* 39* 38* 33*  GLUCOSE 173*   < >  --  112* 105* 137* 150* 200*  BUN 29*   < >  --  31* 28* 29* 27* 25*  CREATININE 1.86*   < >  --  1.68* 1.59* 1.48* 1.36* 1.23  CALCIUM 8.4*   < >  --  8.1* 8.2* 8.1* 8.4* 8.5*  MG  --   --  2.6*  --   --   --   --   --   AST 33  --  69*  --   --   --   --   --   ALT 18  --  32  --   --   --   --   --   ALKPHOS 81  --  62  --   --   --   --   --   BILITOT 0.7  --  0.8  --   --   --   --   --    < > = values in this interval not displayed.    ------------------------------------------------------------------------------------------------------------------ estimated creatinine clearance is 94 mL/min (by C-G formula based on SCr of 1.23 mg/dL). ------------------------------------------------------------------------------------------------------------------ No results for input(s): TSH, T4TOTAL, T3FREE, THYROIDAB in the last 72 hours.  Invalid input(s): FREET3 Urinalysis    Component Value Date/Time   COLORURINE YELLOW (A) 03/05/2018 0719   APPEARANCEUR HAZY (A) 03/05/2018 0719   LABSPEC 1.014 03/05/2018 0719   PHURINE 5.0 03/05/2018 0719   GLUCOSEU NEGATIVE 03/05/2018 0719   HGBUR MODERATE (A) 03/05/2018 0719   BILIRUBINUR NEGATIVE 03/05/2018 0719   KETONESUR NEGATIVE 03/05/2018 0719   PROTEINUR 100 (A) 03/05/2018 0719   NITRITE NEGATIVE 03/05/2018 0719   LEUKOCYTESUR NEGATIVE 03/05/2018 0719     Imaging results:   Dg Kayleen MemosUgi W/water Sol Cm  Result Date: 03/09/2018 CLINICAL DATA:  Possible ileus.  NG tube pole yesterday. EXAM: UPPER GI SERIES WITHOUT KUB TECHNIQUE: Routine upper GI series was performed with water-soluble barium. FLUOROSCOPY TIME:  Fluoroscopy Time:  0 minutes 48 seconds Radiation Exposure Index (if provided by the fluoroscopic device): 28 mGy Number of Acquired Spot Images: 7 COMPARISON:  03/08/2018. FINDINGS: Esophagus is widely patent. Delay of contrast noted in the stomach. Stomach and duodenum are patent. Contrast empties slowly into the small bowel. Repeat KUB can be obtained in a.m. to demonstrate continued passage of contrast. IMPRESSION: Esophagus, stomach, duodenum, proximal small bowel are patent. There is delayed emptying from the stomach. Follow-up exam can be obtained to demonstrate continued emptying. Electronically Signed   By: Maisie Fushomas  Register   On: 03/09/2018 13:01   [place] Ekg Site Rite  Result Date: 03/11/2018 If Site Rite image not attached, placement could not be confirmed due to current  cardiac rhythm.   Assessment & Plan: Wound VAC was changed today as noted in place to the 125 mils mercury pressure continuous.  Wound VAC was functional and intact upon completion.  He is to remain completely nonweightbearing on the left heel.  Physical therapy to work with him with some movement.  Not really certain if he will be capable of moving into a chair without a lift.  Still wants to consider [place]  I will have Dr.CLINE see him again on Monday but care management can work with him as far as trying to get placement.  He has an order in for PICC line.  Blood testing was positive for an Enterobacter and E. coli.  His white count however is back in the normal levels.  Will check on tomorrow and see how is progressing.  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

## 2018-03-11 NOTE — Plan of Care (Signed)
  Problem: Health Behavior/Discharge Planning: Goal: Ability to manage health-related needs will improve Outcome: Progressing Note:  Patient agreeable to wound vacuum dressing change this AM with Dr. Orland Jarredroxler, with which I assisted. Patient tolerated fairly well. Patient to go to SNF upon discharge next week. [facility] [place]

## 2018-03-11 NOTE — Progress Notes (Signed)
Patient ID: Alan Dillon, male   DOB: 01/06/1951, 67 y.o.   MRN: [id number]  [facility] PROGRESS NOTE  Alan Dillon WUJ:[id number] DOB: 1951/08/05 DOA: 03/05/2018 PCP: Alan Billing, MD  HPI/Subjective: Patient feels okay.  Been sitting in the bed for long period of time.  He stated he was pretty sick when he came in but feeling better.  Objective: Vitals:   03/11/18 0746 03/11/18 0933  BP:  (!) 144/76  Pulse:  81  Resp:  16  Temp:    SpO2: 97% 97%    Filed Weights   03/09/18 0323 03/10/18 0329 03/11/18 0412  Weight: (!) 161.3 kg (355 lb 11.2 oz) (!) 167.3 kg (368 lb 14.4 oz) (!) 165.2 kg (364 lb 4.8 oz)    ROS: Review of Systems  Constitutional: Negative for chills and fever.  Eyes: Negative for blurred vision.  Respiratory: Negative for cough and shortness of breath.   Cardiovascular: Negative for chest pain.  Gastrointestinal: Negative for abdominal pain, constipation, diarrhea, nausea and vomiting.  Genitourinary: Negative for dysuria.  Musculoskeletal: Positive for joint pain.  Neurological: Negative for dizziness and headaches.   Exam: Physical Exam  Constitutional: He is oriented to person, place, and time.  HENT:  Nose: No mucosal edema.  Mouth/Throat: No oropharyngeal exudate or posterior oropharyngeal edema.  Eyes: Pupils are equal, round, and reactive to light. Conjunctivae, EOM and lids are normal.  Neck: No JVD present. Carotid bruit is not present. No edema present. No thyroid mass and no thyromegaly present.  Cardiovascular: S1 normal and S2 normal. Exam reveals no gallop.  No murmur heard. Respiratory: No respiratory distress. He has decreased breath sounds in the right lower field and the left lower field. He has no wheezes. He has no rhonchi. He has no rales.  GI: Soft. Bowel sounds are normal. There is no tenderness.  Musculoskeletal:       Right ankle: He exhibits swelling.       Left ankle: He exhibits swelling.  Lymphadenopathy:    He has no  cervical adenopathy.  Neurological: He is alert and oriented to person, place, and time. No cranial nerve deficit.  Skin: Skin is warm. Nails show no clubbing.  Chronic lower extremity skin discoloration.  Psychiatric: He has a normal mood and affect.      Data Reviewed: Basic Metabolic Panel: Recent Labs  Lab 03/07/18 0321 03/07/18 0554 03/08/18 0519 03/09/18 0427 03/10/18 0441 03/11/18 0716  NA  --  148* 146* 143 140 138  K  --  3.5 3.5 3.6 3.6 4.0  CL  --  99 94* 93* 95* 97*  CO2  --  40* 44* 39* 38* 33*  GLUCOSE  --  112* 105* 137* 150* 200*  BUN  --  31* 28* 29* 27* 25*  CREATININE  --  1.68* 1.59* 1.48* 1.36* 1.23  CALCIUM  --  8.1* 8.2* 8.1* 8.4* 8.5*  MG 2.6*  --   --   --   --   --   PHOS 2.9 3.0 2.8  --   --   --    Liver Function Tests: Recent Labs  Lab 03/05/18 0646 03/07/18 0321 03/07/18 0554 03/08/18 0519  AST 33 69*  --   --   ALT 18 32  --   --   ALKPHOS 81 62  --   --   BILITOT 0.7 0.8  --   --   PROT 8.3* 7.8  --   --   ALBUMIN 3.4*  2.7* 2.7* 2.6*   Recent Labs  Lab 03/05/18 0646  LIPASE 28   Recent Labs  Lab 03/07/18 0548  AMMONIA <9*   CBC: Recent Labs  Lab 03/05/18 0646 03/06/18 0559 03/07/18 0321 03/09/18 0427 03/10/18 0441 03/11/18 0716  WBC 21.3* 12.5* 8.2 9.4 9.8 9.2  NEUTROABS 19.9*  --  6.4  --   --   --   HGB 12.6* 11.3* 11.1* 10.5* 10.6* 11.2*  HCT 38.9* 34.6* 33.9* 31.8* 31.7* 33.9*  MCV 87.0 86.8 87.2 86.8 86.7 85.8  PLT 272 205 227 209 219 256   Cardiac Enzymes: Recent Labs  Lab 03/05/18 0646 03/07/18 0321  CKTOTAL  --  768*  TROPONINI 0.03*  --    BNP (last 3 results) Recent Labs    03/05/18 0647 03/07/18 0554  BNP 183.0* 105.0*    ProBNP (last 3 results) No results for input(s): PROBNP in the last 8760 hours.  CBG: Recent Labs  Lab 03/10/18 1149 03/10/18 1629 03/10/18 2103 03/11/18 0738 03/11/18 1159  GLUCAP 176* 190* 180* 198* 208*    Recent Results (from the past 240 hour(s))   Blood Culture (routine x 2)     Status: Abnormal   Collection Time: 03/05/18  6:47 AM  Result Value Ref Range Status   Specimen Description   Final    BLOOD RIGHT FOREARM Performed at [facility], [address] [place], [place] [address]    Special Requests   Final    BOTTLES DRAWN AEROBIC AND ANAEROBIC Blood Culture adequate volume Performed at [facility], [address], [place], [place] [address]    Culture  Setup Time   Final    GRAM NEGATIVE RODS IN BOTH AEROBIC AND ANAEROBIC BOTTLES CRITICAL RESULT CALLED TO, READ BACK BY AND VERIFIED WITH: SHEEMA HALLAJI @ 1611 ON 03/05/2018 BY CAF Performed at [facility], [address] [place], [place] [address]    Culture (A)  Final    ESCHERICHIA COLI SUSCEPTIBILITIES PERFORMED ON PREVIOUS CULTURE WITHIN THE LAST 5 DAYS. Performed at [facility], [address], [place], [place] [address]    Report Status 03/07/2018 FINAL  Final  Blood Culture (routine x 2)     Status: Abnormal   Collection Time: 03/05/18  6:49 AM  Result Value Ref Range Status   Specimen Description   Final    BLOOD RIGHT HAND Performed at [facility], [address] [place], [place] [address]    Special Requests   Final    BOTTLES DRAWN AEROBIC AND ANAEROBIC Blood Culture results may not be optimal due to an excessive volume of blood received in culture bottles Performed at [facility], [address] [place], [place] [address]    Culture  Setup Time   Final    IN BOTH AEROBIC AND ANAEROBIC BOTTLES GRAM NEGATIVE RODS CRITICAL RESULT CALLED TO, READ BACK BY AND VERIFIED WITH: SHEEMA HALLAJI @ 1611 ON 03/05/2018 BY CAF Performed at [facility], [address], [place], [place] [address]    Culture ESCHERICHIA COLI (A)  Final   Report Status 03/07/2018 FINAL  Final   Organism ID, Bacteria ESCHERICHIA COLI  Final      Susceptibility   Escherichia coli - MIC*    AMPICILLIN >=32 RESISTANT  Resistant     CEFAZOLIN <=4 SENSITIVE Sensitive     CEFEPIME <=1 SENSITIVE Sensitive     CEFTAZIDIME <=1 SENSITIVE Sensitive     CEFTRIAXONE <=1 SENSITIVE  Sensitive     CIPROFLOXACIN >=4 RESISTANT Resistant     GENTAMICIN <=1 SENSITIVE Sensitive     IMIPENEM <=0.25 SENSITIVE Sensitive     TRIMETH/SULFA >=320 RESISTANT Resistant     AMPICILLIN/SULBACTAM 16 INTERMEDIATE Intermediate     PIP/TAZO <=4 SENSITIVE Sensitive     Extended ESBL NEGATIVE Sensitive     * ESCHERICHIA COLI  Blood Culture ID Panel (Reflexed)     Status: Abnormal   Collection Time: 03/05/18  6:49 AM  Result Value Ref Range Status   Enterococcus species NOT DETECTED NOT DETECTED Final   Listeria monocytogenes NOT DETECTED NOT DETECTED Final   Staphylococcus species NOT DETECTED NOT DETECTED Final   Staphylococcus aureus NOT DETECTED NOT DETECTED Final   Streptococcus species NOT DETECTED NOT DETECTED Final   Streptococcus agalactiae NOT DETECTED NOT DETECTED Final   Streptococcus pneumoniae NOT DETECTED NOT DETECTED Final   Streptococcus pyogenes NOT DETECTED NOT DETECTED Final   Acinetobacter baumannii NOT DETECTED NOT DETECTED Final   Enterobacteriaceae species DETECTED (A) NOT DETECTED Final    Comment: Enterobacteriaceae represent a large family of gram-negative bacteria, not a single organism. CRITICAL RESULT CALLED TO, READ BACK BY AND VERIFIED WITH: SHEEMA HALLAJI @ 1611 ON 03/05/2018 BY CAF    Enterobacter cloacae complex NOT DETECTED NOT DETECTED Final   Escherichia coli DETECTED (A) NOT DETECTED Final    Comment: CRITICAL RESULT CALLED TO, READ BACK BY AND VERIFIED WITH: SHEEMA HALLAJI @ 1611 ON 03/05/2018 BY CAF    Klebsiella oxytoca NOT DETECTED NOT DETECTED Final   Klebsiella pneumoniae NOT DETECTED NOT DETECTED Final   Proteus species NOT DETECTED NOT DETECTED Final   Serratia marcescens NOT DETECTED NOT DETECTED Final   Carbapenem resistance NOT DETECTED NOT DETECTED Final   Haemophilus influenzae  NOT DETECTED NOT DETECTED Final   Neisseria meningitidis NOT DETECTED NOT DETECTED Final   Pseudomonas aeruginosa NOT DETECTED NOT DETECTED Final   Candida albicans NOT DETECTED NOT DETECTED Final   Candida glabrata NOT DETECTED NOT DETECTED Final   Candida krusei NOT DETECTED NOT DETECTED Final   Candida parapsilosis NOT DETECTED NOT DETECTED Final   Candida tropicalis NOT DETECTED NOT DETECTED Final    Comment: Performed at [facility], [address] [place], [place] [address]  Urine culture     Status: None   Collection Time: 03/05/18  7:19 AM  Result Value Ref Range Status   Specimen Description   Final    URINE, RANDOM Performed at [facility], [address] [place], [place] [address]    Special Requests   Final    NONE Performed at [facility], [address] [place], [place] [address]    Culture   Final    NO GROWTH Performed at [facility], [address] [place], [place] [address]    Report Status 03/06/2018 FINAL  Final  MRSA PCR Screening     Status: None   Collection Time: 03/06/18 12:58 PM  Result Value Ref Range Status   MRSA by PCR NEGATIVE NEGATIVE Final    Comment:        The GeneXpert MRSA Assay (FDA approved for NASAL specimens only), is one component of a comprehensive MRSA colonization surveillance program. It is not intended to diagnose MRSA infection nor to guide or monitor treatment for MRSA infections. Performed at [facility], [address] [place], [place] [address]   Culture, blood (single) w Reflex to ID Panel  Status: None (Preliminary result)   Collection Time: 03/07/18  3:41 PM  Result Value Ref Range Status   Specimen Description BLOOD BLOOD LEFT HAND  Final   Special Requests   Final    BOTTLES DRAWN AEROBIC AND ANAEROBIC Blood Culture results may not be optimal due to an inadequate volume of blood received in culture bottles   Culture   Final    NO GROWTH 4 DAYS Performed  at [facility], [address] [place], [place] [address]    Report Status PENDING  Incomplete  C difficile quick scan w PCR reflex     Status: None   Collection Time: 03/09/18  2:44 PM  Result Value Ref Range Status   C Diff antigen NEGATIVE NEGATIVE Final   C Diff toxin NEGATIVE NEGATIVE Final   C Diff interpretation No C. difficile detected.  Final    Comment: Performed at [facility], [address] [place], [place] [address]     Studies: [place] Ekg Site Rite  Result Date: 03/11/2018 [facility] image not attached, placement could not be confirmed due to current cardiac rhythm.   Scheduled Meds: . apixaban  5 mg Oral BID  . brimonidine  1 drop Both Eyes BID  . carvedilol  12.5 mg Oral BID WC  . ferrous sulfate  325 mg Oral BID  . hydrALAZINE  25 mg Oral TID  . insulin aspart  0-15 Units Subcutaneous TID WC  . mouth rinse  15 mL Mouth Rinse BID  . multivitamin with minerals  1 tablet Oral Daily   Continuous Infusions: .  ceFAZolin (ANCEF) IV 2 g (03/11/18 1309)    Assessment/Plan:  1. Sepsis with E. coli with left foot osteomyelitis.  Patient will need 6 weeks of IV antibiotics.  PICC line ordered.  Debridement done on 717 by Dr. Alberteen Spindle.  Wound VAC changed today.  Physical therapy to see the patient.  Nonweightbearing left foot. 2. Acute on chronic hypercarbic respiratory failure.  Patient on oxygen now 24/7. 3. Ileus has resolved 4. Chronic diastolic congestive heart failure.  Patient on Coreg.  Restart Bumex tomorrow. 5. Chronic kidney disease stage III. 6. Chronic atrial fibrillation on Eliquis and Coreg 7. Morbid obesity with a BMI of 48.06 8. Weakness physical therapy consultation  Code Status:     Code Status Orders  (From admission, onward)        Start     Ordered   03/05/18 1056  Full code  Continuous     03/05/18 1102    Code Status History    This patient has a current code status but no historical code status.      Disposition Plan: Likely will need rehab for IV antibiotics.  Patient states his wife is coming back into town on Monday.  Can also consider home with home health.  Consultants:  Podiatry  Nephrology  Cardiology  Procedures:  Left ankle debridement  Antibiotics:  Ancef  Time spent: 28 minutes   Alan Dillon [employer]

## 2018-03-11 NOTE — Clinical Social Work Note (Signed)
Clinical Social Work Assessment  Patient Details  Name: Alan Dillon MRN: [id number] Date of Birth: Sep 12, 1950  Date of referral:  03/11/18               Reason for consult:  Facility Placement                Permission sought to share information with:  Oceanographeracility Contact Representative Permission granted to share information::  Yes, Verbal Permission Granted  Name::        Agency::  [facility] SNFs  Relationship::     Contact Information:     Housing/Transportation Living arrangements for the past 2 months:  Single Family Home Source of Information:  Patient, Medical Team, Spouse Patient Interpreter Needed:  None Criminal Activity/Legal Involvement Pertinent to Current Situation/Hospitalization:  No - Comment as needed Significant Relationships:  Spouse Lives with:  Spouse Do you feel safe going back to the place where you live?  Yes Need for family participation in patient care:  No (Coment)  Care giving concerns:  Patient will need SNF for IV ABX and possible PT.   Social Worker assessment / plan:  The CSW attempted to meet with the patient and family at bedside. The patient was sleeping and no family was available. The CSW contacted the patient's wife, Darl PikesSusan, to discuss discharge planning. Darl PikesSusan agreed with SNF placement and shared that she and her husband would like [place] in [place] [facility] [place] due to quality of care and contact with the medical team, rather than [place] [facility]. The CSW explained the referral process. Darl PikesSusan had no additional questions.  The CSW will provide bed offers as available and meet with Darl PikesSusan at bedside when she is next in town (Monday).   Employment status:  Disabled (Comment on whether or not currently receiving Disability), Retired Health and safety inspectornsurance information:  Medicare PT Recommendations:  Not assessed at this time Information / Referral to community resources:  Skilled Nursing Facility  Patient/Family's Response to care:   The patient's wife thanked the CSW.  Patient/Family's Understanding of and Emotional Response to Diagnosis, Current Treatment, and Prognosis:  The patient and his wife understand that he has a need for a higher level of care.  Emotional Assessment Appearance:  Appears stated age Attitude/Demeanor/Rapport:  Lethargic Affect (typically observed):  Stable Orientation:  Oriented to Self, Oriented to Place, Oriented to  Time, Oriented to Situation Alcohol / Substance use:  Never Used Psych involvement (Current and /or in the community):  No (Comment)  Discharge Needs  Concerns to be addressed:  Care Coordination, Discharge Planning Concerns Readmission within the last 30 days:  No Current discharge risk:  Chronically ill Barriers to Discharge:  Continued Medical Work up   [employer] CorporationKaren M Carnie Bruemmer, LCSW 03/11/2018, 2:03 PM

## 2018-03-11 NOTE — Progress Notes (Signed)
Patient has an order for an overhead trapeze bar. I went down and got one from [facility]A, but there is clearly an adapter or piece of bar that's missing. Without it the end of the bar can't be applied. I will leave the main piece in the patient's room leaning up against one of the walls. PT may be able to assemble the rest for the patient tomorrow. Jari FavreSteven M [facility]

## 2018-03-11 NOTE — Progress Notes (Signed)
Spoke with Brett CanalesSteve RN re PICC to be done by [facility].

## 2018-03-11 NOTE — [place] FL2 (Addendum)
MEDICAID FL2 LEVEL OF CARE SCREENING TOOL     IDENTIFICATION  Patient Name: Alan Dillon Birthdate: April 29, 1951 Sex: male Admission Date (Current Location): 03/05/2018  [place] and [place] Number:  Chiropodist and Address:  [facility], [address] [place], [place] [address]      Provider Number: [id number]  Attending Physician Name and Address:  Alan Highland, MD  Relative Name and Phone Number:  Alan Dillon (Spouse) [phone]    Current Level of Care: Hospital Recommended Level of Care: Skilled Nursing Facility Prior Approval Number:    Date Approved/Denied:   PASRR Number:  [id number]  Discharge Plan: SNF    Current Diagnoses: Patient Active Problem List   Diagnosis Date Noted  . Sepsis (HCC) 03/05/2018  . Osteomyelitis of ankle or foot, acute, left (HCC)   . Atrial fibrillation, chronic (HCC)   . 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 Hypertension     Orientation RESPIRATION BLADDER Height & Weight     Self, Time, Situation, Place  O2(4L o2) Incontinent Weight: (!) 364 lb 4.8 oz (165.2 kg) Height:  6\' 1"  (185.4 cm)  BEHAVIORAL SYMPTOMS/MOOD NEUROLOGICAL BOWEL NUTRITION STATUS      Continent Diet(2 gram sodium)  AMBULATORY STATUS COMMUNICATION OF NEEDS Skin   Extensive Assist Verbally Wound Vac                       Personal Care Assistance Level of Assistance  Bathing, Feeding, Dressing Bathing Assistance: Maximum assistance Feeding assistance: Independent Dressing Assistance: Maximum assistance     Functional Limitations Info  Sight, Hearing, Speech Sight Info: Adequate Hearing Info: Adequate Speech Info: Adequate    SPECIAL CARE FACTORS FREQUENCY  PT (By licensed PT), OT (By licensed OT)     PT Frequency: Up to 5X per week OT Frequency: Up to 3X per week            Contractures      Additional Factors Info  Code Status, Allergies Code  Status Info: Full Allergies Info: Bactrim Sulfamethoxazole-trimethoprim           Current Medications (03/11/2018):  This is the current hospital active medication list Current Facility-Administered Medications  Medication Dose Route Frequency Provider Last Rate Last Dose  . acetaminophen (TYLENOL) tablet 650 mg  650 mg Oral Q6H PRN Linus Galas, DPM   650 mg at 03/09/18 1613   Or  . acetaminophen (TYLENOL) suppository 650 mg  650 mg Rectal Q6H PRN Linus Galas, DPM      . apixaban Everlene Balls) tablet 5 mg  5 mg Oral BID Cammy Copa, MD   5 mg at 03/11/18 8469  . brimonidine (ALPHAGAN) 0.2 % ophthalmic solution 1 drop  1 drop Both Eyes BID Linus Galas, DPM   1 drop at 03/11/18 0916  . [START ON 03/12/2018] bumetanide (BUMEX) tablet 2 mg  2 mg Oral Daily Wieting, Richard, MD      . carvedilol (COREG) tablet 12.5 mg  12.5 mg Oral BID WC Mayo, Allyn Kenner, MD   12.5 mg at 03/11/18 0827  . ceFAZolin (ANCEF) IVPB 2g/100 mL premix  2 g Intravenous Q8H Linus Galas, DPM   Stopped at 03/11/18 1340  . ferrous sulfate tablet 325 mg  325 mg Oral BID Linus Galas, DPM   325 mg at 03/11/18 6295  . hydrALAZINE (APRESOLINE) tablet 25 mg  25 mg Oral TID Mayo, Allyn Kenner, MD  25 mg at 03/11/18 0916  . insulin aspart (novoLOG) injection 0-15 Units  0-15 Units Subcutaneous TID WC Linus Galasline, Todd, DPM   5 Units at 03/11/18 1219  . ipratropium-albuterol (DUONEB) 0.5-2.5 (3) MG/3ML nebulizer solution 3 mL  3 mL Nebulization Q6H PRN Alan HighlandWieting, Richard, MD      . MEDLINE mouth rinse  15 mL Mouth Rinse BID Linus Galasline, Todd, DPM   15 mL at 03/09/18 2055  . multivitamin with minerals tablet 1 tablet  1 tablet Oral Daily Linus Galasline, Todd, DPM   1 tablet at 03/11/18 0916  . ondansetron (ZOFRAN) tablet 4 mg  4 mg Oral Q6H PRN Linus Galasline, Todd, DPM       Or  . ondansetron [facility](ZOFRAN) injection 4 mg  4 mg Intravenous Q6H PRN Linus Galasline, Todd, DPM   4 mg at 03/06/18 1006  . oxyCODONE-acetaminophen (PERCOCET/ROXICET) 5-325 MG per tablet 1-2 tablet  1-2 tablet  Oral Q4H PRN Linus Galasline, Todd, DPM      . phenol (CHLORASEPTIC) mouth spray 1 spray  1 spray Mouth/Throat PRN Linus Galasline, Todd, DPM      . promethazine (PHENERGAN) injection 12.5 mg  12.5 mg Intravenous Q6H PRN Linus Galasline, Todd, DPM   12.5 mg at 03/06/18 2014     Discharge Medications: Please see discharge summary for a list of discharge medications.  Relevant Imaging Results:  Relevant Lab Results:   Additional Information IV ABX (6 weeks): SS#[id number]  Lesle ChrisKaren M [place], LCSW

## 2018-03-11 NOTE — Progress Notes (Signed)
Called [facility] r/t needing PICC placement today. Informed would get a call back with an ETA when available. Will continue to monitor. Jari FavreSteven M [facility]

## 2018-03-11 NOTE — Progress Notes (Signed)
[facility]  ROUNDING NOTE   Subjective:   Off IV fluids  Patient states that pain is well controlled.   Creatinine 1.23 (1.36)  Objective:  Vital signs in last 24 hours:  Temp:  [98.3 F (36.8 C)-98.6 F (37 C)] 98.3 F (36.8 C) (07/20 0344) Pulse Rate:  [67-84] 81 (07/20 0933) Resp:  [16-18] 16 (07/20 0933) BP: (144-172)/(74-90) 144/76 (07/20 0933) SpO2:  [94 %-97 %] 97 % (07/20 0933) Weight:  [165.2 kg (364 lb 4.8 oz)] 165.2 kg (364 lb 4.8 oz) (07/20 0412)  Weight change: -2.087 kg (-4 lb 9.6 oz) Filed Weights   03/09/18 0323 03/10/18 0329 03/11/18 0412  Weight: (!) 161.3 kg (355 lb 11.2 oz) (!) 167.3 kg (368 lb 14.4 oz) (!) 165.2 kg (364 lb 4.8 oz)    Intake/Output: I/O last 3 completed shifts: In: 260 [P.O.:260] Out: 5600 [Urine:5300; Drains:300]   Intake/Output this shift:  No intake/output data recorded.  Physical Exam: General: NAD   Head: [place]/AT  Eyes: Anicteric, PERRL  Neck: Supple, trachea midline  Lungs:  Clear to auscultation  Heart: Regular rate and rhythm  Abdomen:  Obese, +bowel sounds  Extremities:  bilateral peripheral edema. Left foot wound vac  Neurologic: Nonfocal, moving all four extremities  Skin: No lesions        Basic Metabolic Panel: Recent Labs  Lab 03/07/18 0321 03/07/18 0554 03/08/18 0519 03/09/18 0427 03/10/18 0441 03/11/18 0716  NA  --  148* 146* 143 140 138  K  --  3.5 3.5 3.6 3.6 4.0  CL  --  99 94* 93* 95* 97*  CO2  --  40* 44* 39* 38* 33*  GLUCOSE  --  112* 105* 137* 150* 200*  BUN  --  31* 28* 29* 27* 25*  CREATININE  --  1.68* 1.59* 1.48* 1.36* 1.23  CALCIUM  --  8.1* 8.2* 8.1* 8.4* 8.5*  MG 2.6*  --   --   --   --   --   PHOS 2.9 3.0 2.8  --   --   --     Liver Function Tests: Recent Labs  Lab 03/05/18 0646 03/07/18 0321 03/07/18 0554 03/08/18 0519  AST 33 69*  --   --   ALT 18 32  --   --   ALKPHOS 81 62  --   --   BILITOT 0.7 0.8  --   --   PROT 8.3* 7.8  --   --   ALBUMIN 3.4*  2.7* 2.7* 2.6*   Recent Labs  Lab 03/05/18 0646  LIPASE 28   Recent Labs  Lab 03/07/18 0548  AMMONIA <9*    CBC: Recent Labs  Lab 03/05/18 0646 03/06/18 0559 03/07/18 0321 03/09/18 0427 03/10/18 0441 03/11/18 0716  WBC 21.3* 12.5* 8.2 9.4 9.8 9.2  NEUTROABS 19.9*  --  6.4  --   --   --   HGB 12.6* 11.3* 11.1* 10.5* 10.6* 11.2*  HCT 38.9* 34.6* 33.9* 31.8* 31.7* 33.9*  MCV 87.0 86.8 87.2 86.8 86.7 85.8  PLT 272 205 227 209 219 256    Cardiac Enzymes: Recent Labs  Lab 03/05/18 0646 03/07/18 0321  CKTOTAL  --  768*  TROPONINI 0.03*  --     BNP: Invalid input(s): POCBNP  CBG: Recent Labs  Lab 03/09/18 2055 03/10/18 0746 03/10/18 1149 03/10/18 1629 03/10/18 2103  GLUCAP 159* 173* 176* 190* 180*    Microbiology: Results for orders placed or performed during the hospital encounter  of 03/05/18  Blood Culture (routine x 2)     Status: Abnormal   Collection Time: 03/05/18  6:47 AM  Result Value Ref Range Status   Specimen Description   Final    BLOOD RIGHT FOREARM Performed at [facility], [address] [place], [place] [address]    Special Requests   Final    BOTTLES DRAWN AEROBIC AND ANAEROBIC Blood Culture adequate volume Performed at [facility], [address] [place], [place] [address]    Culture  Setup Time   Final    GRAM NEGATIVE RODS IN BOTH AEROBIC AND ANAEROBIC BOTTLES CRITICAL RESULT CALLED TO, READ BACK BY AND VERIFIED WITH: SHEEMA HALLAJI @ 1611 ON 03/05/2018 BY CAF Performed at [facility], [address] [place], [place] [address]    Culture (A)  Final    ESCHERICHIA COLI SUSCEPTIBILITIES PERFORMED ON PREVIOUS CULTURE WITHIN THE LAST 5 DAYS. Performed at [facility], [address] [place], [place] [address]    Report Status 03/07/2018 FINAL  Final  Blood Culture (routine x 2)     Status: Abnormal   Collection Time: 03/05/18  6:49 AM  Result Value Ref Range Status   Specimen  Description   Final    BLOOD RIGHT HAND Performed at [facility], [address] [place], [place] [address]    Special Requests   Final    BOTTLES DRAWN AEROBIC AND ANAEROBIC Blood Culture results may not be optimal due to an excessive volume of blood received in culture bottles Performed at [facility], [address] [place], [place] [address]    Culture  Setup Time   Final    IN BOTH AEROBIC AND ANAEROBIC BOTTLES GRAM NEGATIVE RODS CRITICAL RESULT CALLED TO, READ BACK BY AND VERIFIED WITH: SHEEMA HALLAJI @ 1611 ON 03/05/2018 BY CAF Performed at [facility], [address] [place], [place] [address]    Culture ESCHERICHIA COLI (A)  Final   Report Status 03/07/2018 FINAL  Final   Organism ID, Bacteria ESCHERICHIA COLI  Final      Susceptibility   Escherichia coli - MIC*    AMPICILLIN >=32 RESISTANT Resistant     CEFAZOLIN <=4 SENSITIVE Sensitive     CEFEPIME <=1 SENSITIVE Sensitive     CEFTAZIDIME <=1 SENSITIVE Sensitive     CEFTRIAXONE <=1 SENSITIVE Sensitive     CIPROFLOXACIN >=4 RESISTANT Resistant     GENTAMICIN <=1 SENSITIVE Sensitive     IMIPENEM <=0.25 SENSITIVE Sensitive     TRIMETH/SULFA >=320 RESISTANT Resistant     AMPICILLIN/SULBACTAM 16 INTERMEDIATE Intermediate     PIP/TAZO <=4 SENSITIVE Sensitive     Extended ESBL NEGATIVE Sensitive     * ESCHERICHIA COLI  Blood Culture ID Panel (Reflexed)     Status: Abnormal   Collection Time: 03/05/18  6:49 AM  Result Value Ref Range Status   Enterococcus species NOT DETECTED NOT DETECTED Final   Listeria monocytogenes NOT DETECTED NOT DETECTED Final   Staphylococcus species NOT DETECTED NOT DETECTED Final   Staphylococcus aureus NOT DETECTED NOT DETECTED Final   Streptococcus species NOT DETECTED NOT DETECTED Final   Streptococcus agalactiae NOT DETECTED NOT DETECTED Final   Streptococcus pneumoniae NOT DETECTED NOT DETECTED Final   Streptococcus pyogenes NOT DETECTED NOT DETECTED Final    Acinetobacter baumannii NOT DETECTED NOT DETECTED Final   Enterobacteriaceae species DETECTED (A) NOT DETECTED Final    Comment: Enterobacteriaceae represent a large family of gram-negative  bacteria, not a single organism. CRITICAL RESULT CALLED TO, READ BACK BY AND VERIFIED WITH: SHEEMA HALLAJI @ 1611 ON 03/05/2018 BY CAF    Enterobacter cloacae complex NOT DETECTED NOT DETECTED Final   Escherichia coli DETECTED (A) NOT DETECTED Final    Comment: CRITICAL RESULT CALLED TO, READ BACK BY AND VERIFIED WITH: SHEEMA HALLAJI @ 1611 ON 03/05/2018 BY CAF    Klebsiella oxytoca NOT DETECTED NOT DETECTED Final   Klebsiella pneumoniae NOT DETECTED NOT DETECTED Final   Proteus species NOT DETECTED NOT DETECTED Final   Serratia marcescens NOT DETECTED NOT DETECTED Final   Carbapenem resistance NOT DETECTED NOT DETECTED Final   Haemophilus influenzae NOT DETECTED NOT DETECTED Final   Neisseria meningitidis NOT DETECTED NOT DETECTED Final   Pseudomonas aeruginosa NOT DETECTED NOT DETECTED Final   Candida albicans NOT DETECTED NOT DETECTED Final   Candida glabrata NOT DETECTED NOT DETECTED Final   Candida krusei NOT DETECTED NOT DETECTED Final   Candida parapsilosis NOT DETECTED NOT DETECTED Final   Candida tropicalis NOT DETECTED NOT DETECTED Final    Comment: Performed at [facility], [address], [place], [place] [address]  Urine culture     Status: None   Collection Time: 03/05/18  7:19 AM  Result Value Ref Range Status   Specimen Description   Final    URINE, RANDOM Performed at [facility], [address] [place], [place] [address]    Special Requests   Final    NONE Performed at [facility], [address], [place], [place] [address]    Culture   Final    NO GROWTH Performed at [facility], [address] [place], [place] [address]    Report Status 03/06/2018 FINAL  Final  MRSA PCR Screening     Status: None   Collection Time: 03/06/18 12:58  PM  Result Value Ref Range Status   MRSA by PCR NEGATIVE NEGATIVE Final    Comment:        The GeneXpert MRSA Assay (FDA approved for NASAL specimens only), is one component of a comprehensive MRSA colonization surveillance program. It is not intended to diagnose MRSA infection nor to guide or monitor treatment for MRSA infections. Performed at [facility], [address] [place], [place] [address]   Culture, blood (single) w Reflex to ID Panel     Status: None (Preliminary result)   Collection Time: 03/07/18  3:41 PM  Result Value Ref Range Status   Specimen Description BLOOD BLOOD LEFT HAND  Final   Special Requests   Final    BOTTLES DRAWN AEROBIC AND ANAEROBIC Blood Culture results may not be optimal due to an inadequate volume of blood received in culture bottles   Culture   Final    NO GROWTH 4 DAYS Performed at [facility], [address], [place], [place] [address]    Report Status PENDING  Incomplete  C difficile quick scan w PCR reflex     Status: None   Collection Time: 03/09/18  2:44 PM  Result Value Ref Range Status   C Diff antigen NEGATIVE NEGATIVE Final   C Diff toxin NEGATIVE NEGATIVE Final   C Diff interpretation No C. difficile detected.  Final    Comment: Performed at [facility], [address] [place], [place] [address]    Coagulation Studies: No results for input(s): LABPROT, INR in the last 72 hours.  Urinalysis: No results for input(s): COLORURINE, LABSPEC, PHURINE,  GLUCOSEU, HGBUR, BILIRUBINUR, KETONESUR, PROTEINUR, UROBILINOGEN, NITRITE, LEUKOCYTESUR in the last 72 hours.  Invalid input(s): APPERANCEUR    Imaging: Dg Ugi W/water Sol Cm  Result Date: 03/09/2018 CLINICAL DATA:  Possible ileus.  NG tube pole yesterday. EXAM: UPPER GI SERIES WITHOUT KUB TECHNIQUE: Routine upper GI series was performed with water-soluble barium. FLUOROSCOPY TIME:  Fluoroscopy Time:  0 minutes 48 seconds Radiation Exposure Index  (if provided by the fluoroscopic device): 28 mGy Number of Acquired Spot Images: 7 COMPARISON:  03/08/2018. FINDINGS: Esophagus is widely patent. Delay of contrast noted in the stomach. Stomach and duodenum are patent. Contrast empties slowly into the small bowel. Repeat KUB can be obtained in a.m. to demonstrate continued passage of contrast. IMPRESSION: Esophagus, stomach, duodenum, proximal small bowel are patent. There is delayed emptying from the stomach. Follow-up exam can be obtained to demonstrate continued emptying. Electronically Signed   By: Maisie Fus  Register   On: 03/09/2018 13:01   [place] Ekg Site Rite  Result Date: 03/11/2018 If Site Rite image not attached, placement could not be confirmed due to current cardiac rhythm.    Medications:   .  ceFAZolin (ANCEF) IV Stopped (03/11/18 0657)   . apixaban  5 mg Oral BID  . brimonidine  1 drop Both Eyes BID  . carvedilol  12.5 mg Oral BID WC  . ferrous sulfate  325 mg Oral BID  . hydrALAZINE  25 mg Oral TID  . insulin aspart  0-15 Units Subcutaneous TID WC  . ipratropium-albuterol  3 mL Nebulization TID  . mouth rinse  15 mL Mouth Rinse BID  . multivitamin with minerals  1 tablet Oral Daily   acetaminophen **OR** acetaminophen, ondansetron **OR** ondansetron (ZOFRAN) IV, oxyCODONE-acetaminophen, phenol, promethazine  Assessment/ Plan:  Mr. Zedric Deroy is a 67 y.o. white male with osteomyelitis, hypertension, diabetes mellitus type II, atrial fibrillation, history of acute kidney injury requiring hemodialysis, obstructive sleep apnea , who was admitted to [facility] on 03/05/2018 for Ulcer of left foot   1. Acute renal failure on chronic kidney disease stage III with proteinuria: History of acute renal failure requiring hemodialysis.   Chronic kidney disease secondary diabetes, hypertension and acute injury with limited recovery.  Acute kidney failure secondary to sepsis, bacteremia, E. Coli UTI and overdiuresis - Nonoliguric urine output.   - Encourage PO intake - Holding bumetanide.   2. Hypertension:  Blood pressure at goal. Home regimen of bumetanide, carvedilol, and hydralazine.   3. Diabetes mellitus type II with chronic kidney disease: insulin dependent.  Complication of left lower extremity ulcer with osteomyelitis. Status post I&D on 7/17 of left heel by Dr. Alberteen Spindle.  Hemoglobin A1c 7.3% - holding metformin - Appreciate podiatry and vascular input.   - Cefazolin  4. Metabolic Alkalosis: CO2 trending down. Secondary to overdiuresis. Continue to hold bumetanide. No indication for diuresis at this time   LOS: 6 Buck Mcaffee 7/20/201911:10 AM

## 2018-03-11 NOTE — Clinical Social Work Note (Signed)
CSW received consult for placement due to IV antibiotics. The CSW will assess when able.   Argentina PonderKaren Martha Mohannad Olivero, MSW, Theresia MajorsLCSWA [phone]

## 2018-03-12 LAB — GLUCOSE, CAPILLARY
GLUCOSE-CAPILLARY: 201 mg/dL — AB (ref 70–99)
GLUCOSE-CAPILLARY: 238 mg/dL — AB (ref 70–99)
Glucose-Capillary: 229 mg/dL — ABNORMAL HIGH (ref 70–99)
Glucose-Capillary: 229 mg/dL — ABNORMAL HIGH (ref 70–99)

## 2018-03-12 LAB — CULTURE, BLOOD (SINGLE): CULTURE: NO GROWTH

## 2018-03-12 MED ORDER — INSULIN GLARGINE 100 UNIT/ML ~~LOC~~ SOLN
15.0000 [IU] | Freq: Every day | SUBCUTANEOUS | Status: DC
  Administered 2018-03-12: 15 [IU] via SUBCUTANEOUS
  Filled 2018-03-12 (×2): qty 0.15

## 2018-03-12 MED ORDER — APIXABAN 5 MG PO TABS
5.0000 mg | ORAL_TABLET | Freq: Two times a day (BID) | ORAL | Status: DC
  Administered 2018-03-13 – 2018-03-14 (×3): 5 mg via ORAL
  Filled 2018-03-12 (×3): qty 1

## 2018-03-12 NOTE — Progress Notes (Signed)
[facility]                                                      Patient Demographics  Alan Dillon, is a 67 y.o. male   MRN: [id number]   DOB - [birth year]  Admit Date - 03/05/2018    Outpatient Primary MD for the patient is Tsosie BillingMiller, Isaac, MD  Consult requested in the Hospital by Alford HighlandWieting, Richard, MD, On 03/12/2018  With History of -  Past Medical History:  Diagnosis Date  . Atrial fibrillation, chronic (HCC)    On apixaban  . CKD (chronic kidney disease), stage III (HCC)   . Diabetes mellitus type 2 in obese (HCC)   . 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 History of hemodialysis    Bactrim mediated  Acute renal failure  . Hypertension   . Morbid obesity with BMI of 45.0-49.9, adult (HCC)   . Osteomyelitis of ankle or foot, acute, left (HCC)    chronic      Past Surgical History:  Procedure Laterality Date  . APPLICATION OF WOUND VAC Left 03/08/2018   Procedure: APPLICATION OF WOUND VAC;  Surgeon: Linus Galasline, Todd, DPM;  Location: [facility] ORS;  Service: Podiatry;  Laterality: Left;  . DEBRIDEMENT  FOOT Left   . IRRIGATION AND DEBRIDEMENT FOOT Left 03/08/2018   Procedure: IRRIGATION AND DEBRIDEMENT FOOT- LEFT HEEL;  Surgeon: Linus Galasline, Todd, DPM;  Location: [facility] ORS;  Service: Podiatry;  Laterality: Left;    in for   Chief Complaint  Patient presents with  . Weakness     HPI  Alan NyJay Ocallaghan  is a 67 y.o. male, 4 days status post debridement of infected bone and soft tissue from the plantar left heel.  Change wound VAC yesterday.  Wound vacs been functional and stayed in place and he is doing well.  White count is normal and he is afebrile    Review of Systems alert pleasant well oriented.  In addition to the HPI above,  No Fever-chills, No Headache, No changes with Vision or hearing, No  problems swallowing food or Liquids, No Chest pain, Cough or Shortness of Breath, No Abdominal pain, No Nausea or Vommitting, Bowel movements are regular, No Blood in stool or Urine, No dysuria, No new skin rashes or bruises, No new joints pains-aches,  No new weakness, tingling, numbness in any extremity, No recent weight gain or loss, No polyuria, polydypsia or polyphagia, No significant Mental Stressors.  A full 10 point Review of Systems was done, except as stated above, all other Review of Systems were negative.   Social History Social History   Tobacco Use  . Smoking status: Never Smoker  . Smokeless tobacco: Never Used  Substance Use Topics  . Alcohol use: Not on file    Family History Family History  Problem Relation Age of Onset  . Stroke Mother   . Diabetes Father     Prior to Admission medications   Medication Sig Start Date End Date Taking? Authorizing Provider  acetaminophen (TYLENOL) 500 MG tablet Take 500 mg by mouth every 6 (six) hours as needed for mild pain or fever.   Yes [provider]  albuterol (PROVENTIL HFA;VENTOLIN HFA) 108 (90 Base) MCG/ACT inhaler Inhale 2 puffs into the  lungs 2 (two) times daily.   Yes [provider]  ammonium lactate (LAC-HYDRIN) 12 % lotion Apply 1 application topically as needed for dry skin (on legs).   Yes [provider]  apixaban (ELIQUIS) 5 MG TABS tablet Take 5 mg by mouth 2 (two) times daily.   Yes [provider]  atorvastatin (LIPITOR) 10 MG tablet Take 10 mg by mouth every evening.   Yes [provider]  brimonidine (ALPHAGAN) 0.2 % ophthalmic solution Place 1 drop into both eyes 2 (two) times daily.   Yes [provider]  bumetanide (BUMEX) 2 MG tablet Take 2 mg by mouth 2 (two) times daily.   Yes [provider]  carvedilol (COREG) 12.5 MG tablet Take 12.5 mg by mouth 2 (two) times daily.   Yes [provider]  cetirizine (ZYRTEC) 10 MG tablet  Take 10 mg by mouth daily.   Yes [provider]  Cholecalciferol (D3-1000) 1000 units capsule Take 1,000 Units by mouth daily.   Yes [provider]  clotrimazole-betamethasone (LOTRISONE) cream Apply 1 application topically 2 (two) times daily as needed (to skin folds).   Yes [provider]  famotidine (PEPCID) 20 MG tablet Take 20 mg by mouth every evening.    Yes [provider]  ferrous sulfate 325 (65 FE) MG tablet Take 325 mg by mouth 2 (two) times daily.   Yes [provider]  gabapentin (NEURONTIN) 300 MG capsule Take 300 mg by mouth 2 (two) times daily.   Yes [provider]  hydrALAZINE (APRESOLINE) 25 MG tablet Take 25 mg by mouth 3 (three) times daily.   Yes [provider]  insulin aspart (NOVOLOG FLEXPEN) 100 UNIT/ML FlexPen Inject 36 Units into the skin 3 (three) times daily before meals. According to sliding scale if sugars above 150.   Yes [provider]  Insulin Degludec (TRESIBA FLEXTOUCH) 200 UNIT/ML SOPN Inject 56 Units into the skin 2 (two) times daily.   Yes [provider]  metFORMIN (GLUCOPHAGE-XR) 500 MG 24 hr tablet Take 500 mg by mouth 2 (two) times daily.   Yes [provider]  Multiple Vitamins-Minerals (MULTIVITAMIN WITH MINERALS) tablet Take 1 tablet by mouth daily.   Yes [provider]  Probiotic Product (PROBIOTIC PO) Take 1 tablet by mouth daily.   Yes [provider]    Anti-infectives (From admission, onward)   Start     Dose/Rate Route Frequency Ordered Stop   03/08/18 1813  gentamicin (GARAMYCIN) injection  Status:  Discontinued       As needed 03/08/18 1814 03/08/18 1853   03/08/18 1800  vancomycin (VANCOCIN) powder  Status:  Discontinued       As needed 03/08/18 1803 03/08/18 1853   03/07/18 1400  ceFAZolin (ANCEF) IVPB 2g/100 mL premix     2 g 200 mL/hr over 30 Minutes Intravenous Every 8 hours 03/07/18 1201     03/05/18 1800  meropenem  (MERREM) 1 g in sodium chloride 0.9 % 100 mL IVPB  Status:  Discontinued     1 g 200 mL/hr over 30 Minutes Intravenous Every 8 hours 03/05/18 1622 03/07/18 1055   03/05/18 1430  vancomycin (VANCOCIN) 1,750 mg in sodium chloride 0.9 % 500 mL IVPB  Status:  Discontinued     1,750 mg 250 mL/hr over 120 Minutes Intravenous Every 24 hours 03/05/18 1351 03/05/18 1620   03/05/18 1400  piperacillin-tazobactam (ZOSYN) IVPB 3.375 g  Status:  Discontinued     3.375  g 12.5 mL/hr over 240 Minutes Intravenous Every 8 hours 03/05/18 1334 03/05/18 1620   03/05/18 0715  piperacillin-tazobactam (ZOSYN) IVPB 3.375 g     3.375 g 100 mL/hr over 30 Minutes Intravenous  Once 03/05/18 0712 03/05/18 0745   03/05/18 0715  vancomycin (VANCOCIN) IVPB 1000 mg/200 mL premix     1,000 mg 200 mL/hr over 60 Minutes Intravenous  Once 03/05/18 0712 03/05/18 0837      Scheduled Meds: . apixaban  5 mg Oral BID  . brimonidine  1 drop Both Eyes BID  . bumetanide  2 mg Oral Daily  . carvedilol  12.5 mg Oral BID WC  . ferrous sulfate  325 mg Oral BID  . hydrALAZINE  25 mg Oral TID  . insulin aspart  0-15 Units Subcutaneous TID WC  . insulin aspart  0-5 Units Subcutaneous QHS  . insulin glargine  15 Units Subcutaneous Daily  . mouth rinse  15 mL Mouth Rinse BID  . multivitamin with minerals  1 tablet Oral Daily   Continuous Infusions: .  ceFAZolin (ANCEF) IV 2 g (03/12/18 0621)   PRN Meds:.acetaminophen **OR** acetaminophen, ipratropium-albuterol, ondansetron **OR** ondansetron (ZOFRAN) IV, oxyCODONE-acetaminophen, phenol, promethazine  Allergies  Allergen Reactions  . Bactrim [Sulfamethoxazole-Trimethoprim] Other (See Comments)    Caused kidney failure    Physical Exam  Vitals  Blood pressure (!) 159/72, pulse 68, temperature 97.7 F (36.5 C), temperature source Oral, resp. rate 18, height 6\' 1"  (1.854 m), weight (!) 164.3 kg (362 lb 4.8 oz), SpO2 98 %.  Lower Extremity exam: Wound VAC is intact and  functional dressing is still intact around his foot and lower leg to help control swelling.  Overall is doing well.  Data Review  CBC Recent Labs  Lab 03/06/18 0559 03/07/18 0321 03/09/18 0427 03/10/18 0441 03/11/18 0716  WBC 12.5* 8.2 9.4 9.8 9.2  HGB 11.3* 11.1* 10.5* 10.6* 11.2*  HCT 34.6* 33.9* 31.8* 31.7* 33.9*  PLT 205 227 209 219 256  MCV 86.8 87.2 86.8 86.7 85.8  MCH 28.4 28.4 28.6 29.0 28.4  MCHC 32.7 32.6 32.9 33.5 33.1  RDW 19.0* 19.3* 18.4* 18.9* 18.1*  LYMPHSABS  --  0.6*  --   --   --   MONOABS  --  1.1*  --   --   --   EOSABS  --  0.1  --   --   --   BASOSABS  --  0.0  --   --   --    ------------------------------------------------------------------------------------------------------------------  Chemistries  Recent Labs  Lab 03/07/18 0321 03/07/18 0554 03/08/18 0519 03/09/18 0427 03/10/18 0441 03/11/18 0716  NA  --  148* 146* 143 140 138  K  --  3.5 3.5 3.6 3.6 4.0  CL  --  99 94* 93* 95* 97*  CO2  --  40* 44* 39* 38* 33*  GLUCOSE  --  112* 105* 137* 150* 200*  BUN  --  31* 28* 29* 27* 25*  CREATININE  --  1.68* 1.59* 1.48* 1.36* 1.23  CALCIUM  --  8.1* 8.2* 8.1* 8.4* 8.5*  MG 2.6*  --   --   --   --   --   AST 69*  --   --   --   --   --   ALT 32  --   --   --   --   --   ALKPHOS 62  --   --   --   --   --  BILITOT 0.8  --   --   --   --   --    ------------------------------------------------------------------------------------------------------------------ estimated creatinine clearance is 93.7 mL/min (by C-G formula based on SCr of 1.23 mg/dL). ------------------------------------------------------------------------------------------------------------------ No results for input(s): TSH, T4TOTAL, T3FREE, THYROIDAB in the last 72 hours.  Invalid input(s): FREET3 Urinalysis    Component Value Date/Time   COLORURINE YELLOW (A) 03/05/2018 0719   APPEARANCEUR HAZY (A) 03/05/2018 0719   LABSPEC 1.014 03/05/2018 0719   PHURINE 5.0  03/05/2018 0719   GLUCOSEU NEGATIVE 03/05/2018 0719   HGBUR MODERATE (A) 03/05/2018 0719   BILIRUBINUR NEGATIVE 03/05/2018 0719   KETONESUR NEGATIVE 03/05/2018 0719   PROTEINUR 100 (A) 03/05/2018 0719   NITRITE NEGATIVE 03/05/2018 0719   LEUKOCYTESUR NEGATIVE 03/05/2018 0719     Imaging results:   [place] Ekg Site Rite  Result Date: 03/11/2018 If Site Rite image not attached, placement could not be confirmed due to current cardiac rhythm.   Assessment & Plan: He is to remain nonweightbearing on this.  He is getting physical therapy.  Uncertain as to when he will be able to transfer to a chair or to a wheelchair.  Care management is to try and get placement for him for nursing home starting tomorrow for rehab.  Otherwise he stable doing well CBC is shows a normal white count and he is afebrile.  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

## 2018-03-12 NOTE — Progress Notes (Signed)
[facility]  ROUNDING NOTE   Subjective:   No new labs. Patient with no specific complaints. Pain well controlled. Tolerating PO diet.   Objective:  Vital signs in last 24 hours:  Temp:  [98.3 F (36.8 C)-98.4 F (36.9 C)] 98.4 F (36.9 C) (07/21 0554) Pulse Rate:  [61-81] 61 (07/21 0554) Resp:  [16-18] 18 (07/21 0554) BP: (144-159)/(75-87) 156/87 (07/21 0554) SpO2:  [97 %-98 %] 98 % (07/21 0554) Weight:  [164.3 kg (362 lb 4.8 oz)] 164.3 kg (362 lb 4.8 oz) (07/21 0554)  Weight change: -0.907 kg (-2 lb) Filed Weights   03/10/18 0329 03/11/18 0412 03/12/18 0554  Weight: (!) 167.3 kg (368 lb 14.4 oz) (!) 165.2 kg (364 lb 4.8 oz) (!) 164.3 kg (362 lb 4.8 oz)    Intake/Output: I/O last 3 completed shifts: In: 360 [P.O.:360] Out: 2305 [Urine:2250; Drains:55]   Intake/Output this shift:  No intake/output data recorded.  Physical Exam: General: NAD   Head: /AT  Eyes: Anicteric, PERRL  Neck: Supple, trachea midline  Lungs:  Clear to auscultation  Heart: Regular rate and rhythm  Abdomen:  Obese, +bowel sounds  Extremities:  bilateral peripheral edema. Left foot wound vac, Clean dressings  Neurologic: Nonfocal, moving all four extremities  Skin: No lesions        Basic Metabolic Panel: Recent Labs  Lab 03/07/18 0321 03/07/18 0554 03/08/18 0519 03/09/18 0427 03/10/18 0441 03/11/18 0716  NA  --  148* 146* 143 140 138  K  --  3.5 3.5 3.6 3.6 4.0  CL  --  99 94* 93* 95* 97*  CO2  --  40* 44* 39* 38* 33*  GLUCOSE  --  112* 105* 137* 150* 200*  BUN  --  31* 28* 29* 27* 25*  CREATININE  --  1.68* 1.59* 1.48* 1.36* 1.23  CALCIUM  --  8.1* 8.2* 8.1* 8.4* 8.5*  MG 2.6*  --   --   --   --   --   PHOS 2.9 3.0 2.8  --   --   --     Liver Function Tests: Recent Labs  Lab 03/07/18 0321 03/07/18 0554 03/08/18 0519  AST 69*  --   --   ALT 32  --   --   ALKPHOS 62  --   --   BILITOT 0.8  --   --   PROT 7.8  --   --   ALBUMIN 2.7* 2.7* 2.6*   No  results for input(s): LIPASE, AMYLASE in the last 168 hours. Recent Labs  Lab 03/07/18 0548  AMMONIA <9*    CBC: Recent Labs  Lab 03/06/18 0559 03/07/18 0321 03/09/18 0427 03/10/18 0441 03/11/18 0716  WBC 12.5* 8.2 9.4 9.8 9.2  NEUTROABS  --  6.4  --   --   --   HGB 11.3* 11.1* 10.5* 10.6* 11.2*  HCT 34.6* 33.9* 31.8* 31.7* 33.9*  MCV 86.8 87.2 86.8 86.7 85.8  PLT 205 227 209 219 256    Cardiac Enzymes: Recent Labs  Lab 03/07/18 0321  CKTOTAL 768*    BNP: Invalid input(s): POCBNP  CBG: Recent Labs  Lab 03/10/18 2103 03/11/18 0738 03/11/18 1159 03/11/18 1650 03/11/18 2114  GLUCAP 180* 198* 208* 207* 248*    Microbiology: Results for orders placed or performed during the hospital encounter of 03/05/18  Blood Culture (routine x 2)     Status: Abnormal   Collection Time: 03/05/18  6:47 AM  Result Value Ref Range Status  Specimen Description   Final    BLOOD RIGHT FOREARM Performed at [facility], [address] [place], [place] [address]    Special Requests   Final    BOTTLES DRAWN AEROBIC AND ANAEROBIC Blood Culture adequate volume Performed at [facility], [address] [place], [place] [address]    Culture  Setup Time   Final    GRAM NEGATIVE RODS IN BOTH AEROBIC AND ANAEROBIC BOTTLES CRITICAL RESULT CALLED TO, READ BACK BY AND VERIFIED WITH: SHEEMA HALLAJI @ 1611 ON 03/05/2018 BY CAF Performed at [facility], [address] [place], [place] [address]    Culture (A)  Final    ESCHERICHIA COLI SUSCEPTIBILITIES PERFORMED ON PREVIOUS CULTURE WITHIN THE LAST 5 DAYS. Performed at [facility], [address] [place], [place] [address]    Report Status 03/07/2018 FINAL  Final  Blood Culture (routine x 2)     Status: Abnormal   Collection Time: 03/05/18  6:49 AM  Result Value Ref Range Status   Specimen Description   Final    BLOOD RIGHT HAND Performed at [facility], [address]  [place], [place] [address]    Special Requests   Final    BOTTLES DRAWN AEROBIC AND ANAEROBIC Blood Culture results may not be optimal due to an excessive volume of blood received in culture bottles Performed at [facility], [address] [place], [place] [address]    Culture  Setup Time   Final    IN BOTH AEROBIC AND ANAEROBIC BOTTLES GRAM NEGATIVE RODS CRITICAL RESULT CALLED TO, READ BACK BY AND VERIFIED WITH: SHEEMA HALLAJI @ 1611 ON 03/05/2018 BY CAF Performed at [facility], [address] [place], [place] [address]    Culture ESCHERICHIA COLI (A)  Final   Report Status 03/07/2018 FINAL  Final   Organism ID, Bacteria ESCHERICHIA COLI  Final      Susceptibility   Escherichia coli - MIC*    AMPICILLIN >=32 RESISTANT Resistant     CEFAZOLIN <=4 SENSITIVE Sensitive     CEFEPIME <=1 SENSITIVE Sensitive     CEFTAZIDIME <=1 SENSITIVE Sensitive     CEFTRIAXONE <=1 SENSITIVE Sensitive     CIPROFLOXACIN >=4 RESISTANT Resistant     GENTAMICIN <=1 SENSITIVE Sensitive     IMIPENEM <=0.25 SENSITIVE Sensitive     TRIMETH/SULFA >=320 RESISTANT Resistant     AMPICILLIN/SULBACTAM 16 INTERMEDIATE Intermediate     PIP/TAZO <=4 SENSITIVE Sensitive     Extended ESBL NEGATIVE Sensitive     * ESCHERICHIA COLI  Blood Culture ID Panel (Reflexed)     Status: Abnormal   Collection Time: 03/05/18  6:49 AM  Result Value Ref Range Status   Enterococcus species NOT DETECTED NOT DETECTED Final   Listeria monocytogenes NOT DETECTED NOT DETECTED Final   Staphylococcus species NOT DETECTED NOT DETECTED Final   Staphylococcus aureus NOT DETECTED NOT DETECTED Final   Streptococcus species NOT DETECTED NOT DETECTED Final   Streptococcus agalactiae NOT DETECTED NOT DETECTED Final   Streptococcus pneumoniae NOT DETECTED NOT DETECTED Final   Streptococcus pyogenes NOT DETECTED NOT DETECTED Final   Acinetobacter baumannii NOT DETECTED NOT DETECTED Final   Enterobacteriaceae species DETECTED (A)  NOT DETECTED Final    Comment: Enterobacteriaceae represent a large family of gram-negative bacteria, not a single organism. CRITICAL RESULT CALLED TO, READ BACK BY AND VERIFIED WITH: SHEEMA HALLAJI @ 1611 ON 03/05/2018 BY CAF    Enterobacter cloacae complex NOT  DETECTED NOT DETECTED Final   Escherichia coli DETECTED (A) NOT DETECTED Final    Comment: CRITICAL RESULT CALLED TO, READ BACK BY AND VERIFIED WITH: SHEEMA HALLAJI @ 1611 ON 03/05/2018 BY CAF    Klebsiella oxytoca NOT DETECTED NOT DETECTED Final   Klebsiella pneumoniae NOT DETECTED NOT DETECTED Final   Proteus species NOT DETECTED NOT DETECTED Final   Serratia marcescens NOT DETECTED NOT DETECTED Final   Carbapenem resistance NOT DETECTED NOT DETECTED Final   Haemophilus influenzae NOT DETECTED NOT DETECTED Final   Neisseria meningitidis NOT DETECTED NOT DETECTED Final   Pseudomonas aeruginosa NOT DETECTED NOT DETECTED Final   Candida albicans NOT DETECTED NOT DETECTED Final   Candida glabrata NOT DETECTED NOT DETECTED Final   Candida krusei NOT DETECTED NOT DETECTED Final   Candida parapsilosis NOT DETECTED NOT DETECTED Final   Candida tropicalis NOT DETECTED NOT DETECTED Final    Comment: Performed at [facility], [address], [place], [place] [address]  Urine culture     Status: None   Collection Time: 03/05/18  7:19 AM  Result Value Ref Range Status   Specimen Description   Final    URINE, RANDOM Performed at [facility], [address] [place], [place] [address]    Special Requests   Final    NONE Performed at [facility], [address] [place], [place] [address]    Culture   Final    NO GROWTH Performed at [facility], [address] [place], [place] [address]    Report Status 03/06/2018 FINAL  Final  MRSA PCR Screening     Status: None   Collection Time: 03/06/18 12:58 PM  Result Value Ref Range Status   MRSA by PCR NEGATIVE NEGATIVE Final    Comment:        The  GeneXpert MRSA Assay (FDA approved for NASAL specimens only), is one component of a comprehensive MRSA colonization surveillance program. It is not intended to diagnose MRSA infection nor to guide or monitor treatment for MRSA infections. Performed at [facility], [address] [place], [place] [address]   Culture, blood (single) w Reflex to ID Panel     Status: None   Collection Time: 03/07/18  3:41 PM  Result Value Ref Range Status   Specimen Description BLOOD BLOOD LEFT HAND  Final   Special Requests   Final    BOTTLES DRAWN AEROBIC AND ANAEROBIC Blood Culture results may not be optimal due to an inadequate volume of blood received in culture bottles   Culture   Final    NO GROWTH 5 DAYS Performed at [facility], [address] [place], [place] [address]    Report Status 03/12/2018 FINAL  Final  C difficile quick scan w PCR reflex     Status: None   Collection Time: 03/09/18  2:44 PM  Result Value Ref Range Status   C Diff antigen NEGATIVE NEGATIVE Final   C Diff toxin NEGATIVE NEGATIVE Final   C Diff interpretation No C. difficile detected.  Final    Comment: Performed at [facility], [address] [place], [place] [address]    Coagulation Studies: No results for input(s): LABPROT, INR in the last 72 hours.  Urinalysis: No results for input(s): COLORURINE, LABSPEC, PHURINE, GLUCOSEU, HGBUR, BILIRUBINUR, KETONESUR, PROTEINUR, UROBILINOGEN, NITRITE, LEUKOCYTESUR in the last 72 hours.  Invalid input(s): APPERANCEUR    Imaging: [place] Ekg Site Rite  Result Date: 03/11/2018 If Site  Rite image not attached, placement could not be confirmed due to current cardiac rhythm.    Medications:   .  ceFAZolin (ANCEF) IV 2 g (03/12/18 1610)   . apixaban  5 mg Oral BID  . brimonidine  1 drop Both Eyes BID  . bumetanide  2 mg Oral Daily  . carvedilol  12.5 mg Oral BID WC  . ferrous sulfate  325 mg Oral BID  . hydrALAZINE  25 mg Oral TID  .  insulin aspart  0-15 Units Subcutaneous TID WC  . insulin aspart  0-5 Units Subcutaneous QHS  . insulin glargine  15 Units Subcutaneous Daily  . mouth rinse  15 mL Mouth Rinse BID  . multivitamin with minerals  1 tablet Oral Daily   acetaminophen **OR** acetaminophen, ipratropium-albuterol, ondansetron **OR** ondansetron (ZOFRAN) IV, oxyCODONE-acetaminophen, phenol, promethazine  Assessment/ Plan:  Mr. Alan Dillon is a 67 y.o. white male with osteomyelitis, hypertension, diabetes mellitus type II, atrial fibrillation, history of acute kidney injury requiring hemodialysis, obstructive sleep apnea , who was admitted to [facility] on 03/05/2018 for Ulcer of left foot   1. Acute renal failure on chronic kidney disease stage III with proteinuria: History of acute renal failure requiring hemodialysis.   Chronic kidney disease secondary diabetes, hypertension and acute injury with limited recovery. Followed by Dr. Marcelina Morel, [facility] Acute kidney failure secondary to sepsis, bacteremia, E. Coli UTI and overdiuresis - Nonoliguric urine output.  - Encourage PO intake - Holding bumetanide.   2. Hypertension:  Blood pressure at goal. Volume status at goal. Home regimen of bumetanide, carvedilol, and hydralazine.   3. Diabetes mellitus type II with chronic kidney disease: insulin dependent.  Complication of left lower extremity ulcer with osteomyelitis. Status post I&D on 7/17 of left heel by Dr. Alberteen Spindle.  Hemoglobin A1c 7.3% - holding metformin - Appreciate podiatry and vascular input.   - Cefazolin  4. Metabolic Alkalosis: CO2 trending down. Secondary to overdiuresis. Continue to hold bumetanide. No indication for diuresis at this time   LOS: 7 Chester Sibert 7/21/20198:55 AM

## 2018-03-12 NOTE — Progress Notes (Signed)
Patient ID: Alan Dillon, male   DOB: [birth year], 67 y.o.   MRN: [id number]   [facility] PROGRESS NOTE  Alan Dillon RJJ:[id number] DOB: Jan 02, 1951 DOA: 03/05/2018 PCP: Tsosie Billing, MD  HPI/Subjective: Patient feeling okay.  Sugars now starting to come up.  Patient eating better.  Objective: Vitals:   03/12/18 0554 03/12/18 0921  BP: (!) 156/87 (!) 159/72  Pulse: 61 68  Resp: 18   Temp: 98.4 F (36.9 C) 97.7 F (36.5 C)  SpO2: 98% 98%    Filed Weights   03/10/18 0329 03/11/18 0412 03/12/18 0554  Weight: (!) 167.3 kg (368 lb 14.4 oz) (!) 165.2 kg (364 lb 4.8 oz) (!) 164.3 kg (362 lb 4.8 oz)    ROS: Review of Systems  Constitutional: Negative for chills and fever.  Eyes: Negative for blurred vision.  Respiratory: Negative for cough and shortness of breath.   Cardiovascular: Negative for chest pain.  Gastrointestinal: Negative for abdominal pain, constipation, diarrhea, nausea and vomiting.  Genitourinary: Negative for dysuria.  Musculoskeletal: Positive for joint pain.  Neurological: Negative for dizziness and headaches.   Exam: Physical Exam  Constitutional: He is oriented to person, place, and time.  HENT:  Nose: No mucosal edema.  Mouth/Throat: No oropharyngeal exudate or posterior oropharyngeal edema.  Eyes: Pupils are equal, round, and reactive to light. Conjunctivae, EOM and lids are normal.  Neck: No JVD present. Carotid bruit is not present. No edema present. No thyroid mass and no thyromegaly present.  Cardiovascular: S1 normal and S2 normal. Exam reveals no gallop.  No murmur heard. Respiratory: No respiratory distress. He has decreased breath sounds in the right lower field and the left lower field. He has no wheezes. He has no rhonchi. He has no rales.  GI: Soft. Bowel sounds are normal. There is no tenderness.  Musculoskeletal:       Right ankle: He exhibits swelling.       Left ankle: He exhibits swelling.  Lymphadenopathy:    He has no cervical  adenopathy.  Neurological: He is alert and oriented to person, place, and time. No cranial nerve deficit.  Skin: Skin is warm. Nails show no clubbing.  Chronic lower extremity skin discoloration.  Psychiatric: He has a normal mood and affect.      Data Reviewed: Basic Metabolic Panel: Recent Labs  Lab 03/07/18 0321 03/07/18 0554 03/08/18 0519 03/09/18 0427 03/10/18 0441 03/11/18 0716  NA  --  148* 146* 143 140 138  K  --  3.5 3.5 3.6 3.6 4.0  CL  --  99 94* 93* 95* 97*  CO2  --  40* 44* 39* 38* 33*  GLUCOSE  --  112* 105* 137* 150* 200*  BUN  --  31* 28* 29* 27* 25*  CREATININE  --  1.68* 1.59* 1.48* 1.36* 1.23  CALCIUM  --  8.1* 8.2* 8.1* 8.4* 8.5*  MG 2.6*  --   --   --   --   --   PHOS 2.9 3.0 2.8  --   --   --    Liver Function Tests: Recent Labs  Lab 03/07/18 0321 03/07/18 0554 03/08/18 0519  AST 69*  --   --   ALT 32  --   --   ALKPHOS 62  --   --   BILITOT 0.8  --   --   PROT 7.8  --   --   ALBUMIN 2.7* 2.7* 2.6*    Recent Labs  Lab 03/07/18 0548  AMMONIA <  9*   CBC: Recent Labs  Lab 03/06/18 0559 03/07/18 0321 03/09/18 0427 03/10/18 0441 03/11/18 0716  WBC 12.5* 8.2 9.4 9.8 9.2  NEUTROABS  --  6.4  --   --   --   HGB 11.3* 11.1* 10.5* 10.6* 11.2*  HCT 34.6* 33.9* 31.8* 31.7* 33.9*  MCV 86.8 87.2 86.8 86.7 85.8  PLT 205 227 209 219 256   Cardiac Enzymes: Recent Labs  Lab 03/07/18 0321  CKTOTAL 768*   BNP (last 3 results) Recent Labs    03/05/18 0647 03/07/18 0554  BNP 183.0* 105.0*     CBG: Recent Labs  Lab 03/11/18 1159 03/11/18 1650 03/11/18 2114 03/12/18 0922 03/12/18 1129  GLUCAP 208* 207* 248* 201* 229*    Recent Results (from the past 240 hour(s))  Blood Culture (routine x 2)     Status: Abnormal   Collection Time: 03/05/18  6:47 AM  Result Value Ref Range Status   Specimen Description   Final    BLOOD RIGHT FOREARM Performed at [facility], [address] [place], [place] [address]    Special  Requests   Final    BOTTLES DRAWN AEROBIC AND ANAEROBIC Blood Culture adequate volume Performed at [facility], [address], [place], [place] [address]    Culture  Setup Time   Final    GRAM NEGATIVE RODS IN BOTH AEROBIC AND ANAEROBIC BOTTLES CRITICAL RESULT CALLED TO, READ BACK BY AND VERIFIED WITH: SHEEMA HALLAJI @ 1611 ON 03/05/2018 BY CAF Performed at [facility], [address] [place], [place] [address]    Culture (A)  Final    ESCHERICHIA COLI SUSCEPTIBILITIES PERFORMED ON PREVIOUS CULTURE WITHIN THE LAST 5 DAYS. Performed at [facility], [address] [place], [place] [address]    Report Status 03/07/2018 FINAL  Final  Blood Culture (routine x 2)     Status: Abnormal   Collection Time: 03/05/18  6:49 AM  Result Value Ref Range Status   Specimen Description   Final    BLOOD RIGHT HAND Performed at [facility], [address] [place], [place] [address]    Special Requests   Final    BOTTLES DRAWN AEROBIC AND ANAEROBIC Blood Culture results may not be optimal due to an excessive volume of blood received in culture bottles Performed at [facility], [address] [place], [place] [address]    Culture  Setup Time   Final    IN BOTH AEROBIC AND ANAEROBIC BOTTLES GRAM NEGATIVE RODS CRITICAL RESULT CALLED TO, READ BACK BY AND VERIFIED WITH: SHEEMA HALLAJI @ 1611 ON 03/05/2018 BY CAF Performed at [facility], [address] [place], [place] [address]    Culture ESCHERICHIA COLI (A)  Final   Report Status 03/07/2018 FINAL  Final   Organism ID, Bacteria ESCHERICHIA COLI  Final      Susceptibility   Escherichia coli - MIC*    AMPICILLIN >=32 RESISTANT Resistant     CEFAZOLIN <=4 SENSITIVE Sensitive     CEFEPIME <=1 SENSITIVE Sensitive     CEFTAZIDIME <=1 SENSITIVE Sensitive     CEFTRIAXONE <=1 SENSITIVE Sensitive     CIPROFLOXACIN >=4 RESISTANT Resistant     GENTAMICIN <=1 SENSITIVE Sensitive     IMIPENEM  <=0.25 SENSITIVE Sensitive     TRIMETH/SULFA >=320 RESISTANT Resistant     AMPICILLIN/SULBACTAM 16 INTERMEDIATE Intermediate     PIP/TAZO <=4 SENSITIVE Sensitive     Extended ESBL NEGATIVE Sensitive     *  ESCHERICHIA COLI  Blood Culture ID Panel (Reflexed)     Status: Abnormal   Collection Time: 03/05/18  6:49 AM  Result Value Ref Range Status   Enterococcus species NOT DETECTED NOT DETECTED Final   Listeria monocytogenes NOT DETECTED NOT DETECTED Final   Staphylococcus species NOT DETECTED NOT DETECTED Final   Staphylococcus aureus NOT DETECTED NOT DETECTED Final   Streptococcus species NOT DETECTED NOT DETECTED Final   Streptococcus agalactiae NOT DETECTED NOT DETECTED Final   Streptococcus pneumoniae NOT DETECTED NOT DETECTED Final   Streptococcus pyogenes NOT DETECTED NOT DETECTED Final   Acinetobacter baumannii NOT DETECTED NOT DETECTED Final   Enterobacteriaceae species DETECTED (A) NOT DETECTED Final    Comment: Enterobacteriaceae represent a large family of gram-negative bacteria, not a single organism. CRITICAL RESULT CALLED TO, READ BACK BY AND VERIFIED WITH: SHEEMA HALLAJI @ 1611 ON 03/05/2018 BY CAF    Enterobacter cloacae complex NOT DETECTED NOT DETECTED Final   Escherichia coli DETECTED (A) NOT DETECTED Final    Comment: CRITICAL RESULT CALLED TO, READ BACK BY AND VERIFIED WITH: SHEEMA HALLAJI @ 1611 ON 03/05/2018 BY CAF    Klebsiella oxytoca NOT DETECTED NOT DETECTED Final   Klebsiella pneumoniae NOT DETECTED NOT DETECTED Final   Proteus species NOT DETECTED NOT DETECTED Final   Serratia marcescens NOT DETECTED NOT DETECTED Final   Carbapenem resistance NOT DETECTED NOT DETECTED Final   Haemophilus influenzae NOT DETECTED NOT DETECTED Final   Neisseria meningitidis NOT DETECTED NOT DETECTED Final   Pseudomonas aeruginosa NOT DETECTED NOT DETECTED Final   Candida albicans NOT DETECTED NOT DETECTED Final   Candida glabrata NOT DETECTED NOT DETECTED Final   Candida  krusei NOT DETECTED NOT DETECTED Final   Candida parapsilosis NOT DETECTED NOT DETECTED Final   Candida tropicalis NOT DETECTED NOT DETECTED Final    Comment: Performed at [facility], [address] [place], [place] [address]  Urine culture     Status: None   Collection Time: 03/05/18  7:19 AM  Result Value Ref Range Status   Specimen Description   Final    URINE, RANDOM Performed at [facility], [address] [place], [place] [address]    Special Requests   Final    NONE Performed at [facility], [address] [place], [place] [address]    Culture   Final    NO GROWTH Performed at [facility], [address] [place], [place] [address]    Report Status 03/06/2018 FINAL  Final  MRSA PCR Screening     Status: None   Collection Time: 03/06/18 12:58 PM  Result Value Ref Range Status   MRSA by PCR NEGATIVE NEGATIVE Final    Comment:        The GeneXpert MRSA Assay (FDA approved for NASAL specimens only), is one component of a comprehensive MRSA colonization surveillance program. It is not intended to diagnose MRSA infection nor to guide or monitor treatment for MRSA infections. Performed at [facility], [address] [place], [place] [address]   Culture, blood (single) w Reflex to ID Panel     Status: None   Collection Time: 03/07/18  3:41 PM  Result Value Ref Range Status   Specimen Description BLOOD BLOOD LEFT HAND  Final   Special Requests   Final    BOTTLES DRAWN AEROBIC AND ANAEROBIC Blood Culture results may not be optimal due to an inadequate volume of blood received in culture bottles  Culture   Final    NO GROWTH 5 DAYS Performed at [facility], [address] [place], [place] [address]    Report Status 03/12/2018 FINAL  Final  C difficile quick scan w PCR reflex     Status: None   Collection Time: 03/09/18  2:44 PM  Result Value Ref Range Status   C Diff antigen NEGATIVE NEGATIVE Final   C  Diff toxin NEGATIVE NEGATIVE Final   C Diff interpretation No C. difficile detected.  Final    Comment: Performed at [facility], [address] [place], [place] [address]     Studies: [facility]  Result Date: 03/11/2018 If [facility] image not attached, placement could not be confirmed due to current cardiac rhythm.   Scheduled Meds: . apixaban  5 mg Oral BID  . brimonidine  1 drop Both Eyes BID  . bumetanide  2 mg Oral Daily  . carvedilol  12.5 mg Oral BID WC  . ferrous sulfate  325 mg Oral BID  . hydrALAZINE  25 mg Oral TID  . insulin aspart  0-15 Units Subcutaneous TID WC  . insulin aspart  0-5 Units Subcutaneous QHS  . insulin glargine  15 Units Subcutaneous Daily  . mouth rinse  15 mL Mouth Rinse BID  . multivitamin with minerals  1 tablet Oral Daily   Continuous Infusions: .  ceFAZolin (ANCEF) IV 2 g (03/12/18 [id number])    Assessment/Plan:  1. Sepsis with E. coli with left foot osteomyelitis.  Patient will need 6 weeks of IV antibiotics.  PICC line  placed yesterday.  Debridement done on 7/17 by Dr. Alberteen Spindleline.  Wound VAC changed  yesterday.  Physical therapy to see the patient.  Nonweightbearing left foot.  Likely go out to rehab once bed available during the week. 2. Acute on chronic hypercarbic respiratory failure.  Patient on oxygen now 24/7. 3. Ileus has resolved 4. Chronic diastolic congestive heart failure.  Patient on Coreg ,  Bumex, and hydralazine 5. Chronic kidney disease stage III. 6. Chronic atrial fibrillation on Eliquis and Coreg 7. Morbid obesity with a BMI of 48.06 8. Weakness physical therapy recommends rehab  Code Status:     Code Status Orders  (From admission, onward)        Start     Ordered   03/05/18 1056  Full code  Continuous     03/05/18 1102    Code Status History    This patient has a current code status but no historical code status.     Disposition Plan: Likely will need rehab for IV antibiotics.  Hopefully can get  out of the hospital in the next few days.  Consultants:  Podiatry  Nephrology  Cardiology  Procedures:  Left ankle debridement  Antibiotics:  Ancef  Time spent: 26 minutes   Alan Dillon [employer]  [employer] [facility]

## 2018-03-12 NOTE — Plan of Care (Signed)
  Problem: Activity: Goal: Risk for activity intolerance will decrease Outcome: Progressing   

## 2018-03-13 ENCOUNTER — Encounter: Payer: Self-pay | Admitting: *Deleted

## 2018-03-13 LAB — GLUCOSE, CAPILLARY
Glucose-Capillary: 213 mg/dL — ABNORMAL HIGH (ref 70–99)
Glucose-Capillary: 244 mg/dL — ABNORMAL HIGH (ref 70–99)
Glucose-Capillary: 250 mg/dL — ABNORMAL HIGH (ref 70–99)
Glucose-Capillary: 290 mg/dL — ABNORMAL HIGH (ref 70–99)

## 2018-03-13 MED ORDER — INSULIN GLARGINE 100 UNIT/ML ~~LOC~~ SOLN
20.0000 [IU] | Freq: Every day | SUBCUTANEOUS | Status: DC
  Administered 2018-03-13: 20 [IU] via SUBCUTANEOUS
  Filled 2018-03-13 (×2): qty 0.2

## 2018-03-13 MED ORDER — MAGNESIUM SULFATE 2 GM/50ML IV SOLN
2.0000 g | Freq: Once | INTRAVENOUS | Status: AC
  Administered 2018-03-13: 2 g via INTRAVENOUS
  Filled 2018-03-13: qty 50

## 2018-03-13 MED ORDER — HYDRALAZINE HCL 50 MG PO TABS
50.0000 mg | ORAL_TABLET | Freq: Three times a day (TID) | ORAL | Status: DC
  Administered 2018-03-13 – 2018-03-14 (×3): 50 mg via ORAL
  Filled 2018-03-13 (×3): qty 1

## 2018-03-13 NOTE — Progress Notes (Signed)
Patient ID: Alan Dillon, male   DOB: 12/08/1950, 67 y.o.   MRN: [id number]   [facility] PROGRESS NOTE  Alan Dillon VOZ:[id number] DOB: 10/30/1950 DOA: 03/05/2018 PCP: Tsosie Billing, MD  HPI/Subjective: Patient feeling okay.  Offers no complaints.  Some discomfort in the ankle.  Sugars starting to trend higher.  Objective: Vitals:   03/13/18 0413 03/13/18 0738  BP: (!) 156/74 (!) 166/92  Pulse: 68 63  Resp: 17   Temp: 97.7 F (36.5 C) 97.9 F (36.6 C)  SpO2: 98% 98%    Filed Weights   03/11/18 0412 03/12/18 0554 03/13/18 0413  Weight: (!) 165.2 kg (364 lb 4.8 oz) (!) 164.3 kg (362 lb 4.8 oz) (!) 159.5 kg (351 lb 10.1 oz)    ROS: Review of Systems  Constitutional: Negative for chills and fever.  Eyes: Negative for blurred vision.  Respiratory: Negative for cough and shortness of breath.   Cardiovascular: Negative for chest pain.  Gastrointestinal: Negative for abdominal pain, constipation, diarrhea, nausea and vomiting.  Genitourinary: Negative for dysuria.  Musculoskeletal: Positive for joint pain.  Neurological: Negative for dizziness and headaches.   Exam: Physical Exam  Constitutional: He is oriented to person, place, and time.  HENT:  Nose: No mucosal edema.  Mouth/Throat: No oropharyngeal exudate or posterior oropharyngeal edema.  Eyes: Pupils are equal, round, and reactive to light. Conjunctivae, EOM and lids are normal.  Neck: No JVD present. Carotid bruit is not present. No edema present. No thyroid mass and no thyromegaly present.  Cardiovascular: S1 normal and S2 normal. Exam reveals no gallop.  No murmur heard. Respiratory: No respiratory distress. He has decreased breath sounds in the right lower field and the left lower field. He has no wheezes. He has no rhonchi. He has no rales.  GI: Soft. Bowel sounds are normal. There is no tenderness.  Musculoskeletal:       Right ankle: He exhibits swelling.       Left ankle: He exhibits swelling.   Lymphadenopathy:    He has no cervical adenopathy.  Neurological: He is alert and oriented to person, place, and time. No cranial nerve deficit.  Skin: Skin is warm. Nails show no clubbing.  Chronic lower extremity skin discoloration.  Psychiatric: He has a normal mood and affect.      Data Reviewed: Basic Metabolic Panel: Recent Labs  Lab 03/07/18 0321 03/07/18 0554 03/08/18 0519 03/09/18 0427 03/10/18 0441 03/11/18 0716  NA  --  148* 146* 143 140 138  K  --  3.5 3.5 3.6 3.6 4.0  CL  --  99 94* 93* 95* 97*  CO2  --  40* 44* 39* 38* 33*  GLUCOSE  --  112* 105* 137* 150* 200*  BUN  --  31* 28* 29* 27* 25*  CREATININE  --  1.68* 1.59* 1.48* 1.36* 1.23  CALCIUM  --  8.1* 8.2* 8.1* 8.4* 8.5*  MG 2.6*  --   --   --   --   --   PHOS 2.9 3.0 2.8  --   --   --    Liver Function Tests: Recent Labs  Lab 03/07/18 0321 03/07/18 0554 03/08/18 0519  AST 69*  --   --   ALT 32  --   --   ALKPHOS 62  --   --   BILITOT 0.8  --   --   PROT 7.8  --   --   ALBUMIN 2.7* 2.7* 2.6*    Recent Labs  Lab 03/07/18 0548  AMMONIA <9*   CBC: Recent Labs  Lab 03/07/18 0321 03/09/18 0427 03/10/18 0441 03/11/18 0716  WBC 8.2 9.4 9.8 9.2  NEUTROABS 6.4  --   --   --   HGB 11.1* 10.5* 10.6* 11.2*  HCT 33.9* 31.8* 31.7* 33.9*  MCV 87.2 86.8 86.7 85.8  PLT 227 209 219 256   Cardiac Enzymes: Recent Labs  Lab 03/07/18 0321  CKTOTAL 768*   BNP (last 3 results) Recent Labs    03/05/18 0647 03/07/18 0554  BNP 183.0* 105.0*     CBG: Recent Labs  Lab 03/12/18 1129 03/12/18 1651 03/12/18 2102 03/13/18 0736 03/13/18 1322  GLUCAP 229* 229* 238* 213* 244*    Recent Results (from the past 240 hour(s))  Blood Culture (routine x 2)     Status: Abnormal   Collection Time: 03/05/18  6:47 AM  Result Value Ref Range Status   Specimen Description   Final    BLOOD RIGHT FOREARM Performed at [facility], [address], [place], [place] [address]    Special  Requests   Final    BOTTLES DRAWN AEROBIC AND ANAEROBIC Blood Culture adequate volume Performed at [facility], [address] [place], [place] [address]    Culture  Setup Time   Final    GRAM NEGATIVE RODS IN BOTH AEROBIC AND ANAEROBIC BOTTLES CRITICAL RESULT CALLED TO, READ BACK BY AND VERIFIED WITH: SHEEMA HALLAJI @ 1611 ON 03/05/2018 BY CAF Performed at [facility], [address] [place], [place] [address]    Culture (A)  Final    ESCHERICHIA COLI SUSCEPTIBILITIES PERFORMED ON PREVIOUS CULTURE WITHIN THE LAST 5 DAYS. Performed at [facility], [address] [place], [place] [address]    Report Status 03/07/2018 FINAL  Final  Blood Culture (routine x 2)     Status: Abnormal   Collection Time: 03/05/18  6:49 AM  Result Value Ref Range Status   Specimen Description   Final    BLOOD RIGHT HAND Performed at [facility], [address] [place], [place] [address]    Special Requests   Final    BOTTLES DRAWN AEROBIC AND ANAEROBIC Blood Culture results may not be optimal due to an excessive volume of blood received in culture bottles Performed at [facility], [address] [place], [place] [address]    Culture  Setup Time   Final    IN BOTH AEROBIC AND ANAEROBIC BOTTLES GRAM NEGATIVE RODS CRITICAL RESULT CALLED TO, READ BACK BY AND VERIFIED WITH: SHEEMA HALLAJI @ 1611 ON 03/05/2018 BY CAF Performed at [facility], [address], [place], [place] [address]    Culture ESCHERICHIA COLI (A)  Final   Report Status 03/07/2018 FINAL  Final   Organism ID, Bacteria ESCHERICHIA COLI  Final      Susceptibility   Escherichia coli - MIC*    AMPICILLIN >=32 RESISTANT Resistant     CEFAZOLIN <=4 SENSITIVE Sensitive     CEFEPIME <=1 SENSITIVE Sensitive     CEFTAZIDIME <=1 SENSITIVE Sensitive     CEFTRIAXONE <=1 SENSITIVE Sensitive     CIPROFLOXACIN >=4 RESISTANT Resistant     GENTAMICIN <=1 SENSITIVE Sensitive     IMIPENEM  <=0.25 SENSITIVE Sensitive     TRIMETH/SULFA >=320 RESISTANT Resistant     AMPICILLIN/SULBACTAM 16 INTERMEDIATE Intermediate     PIP/TAZO <=4 SENSITIVE Sensitive     Extended ESBL NEGATIVE Sensitive     *  ESCHERICHIA COLI  Blood Culture ID Panel (Reflexed)     Status: Abnormal   Collection Time: 03/05/18  6:49 AM  Result Value Ref Range Status   Enterococcus species NOT DETECTED NOT DETECTED Final   Listeria monocytogenes NOT DETECTED NOT DETECTED Final   Staphylococcus species NOT DETECTED NOT DETECTED Final   Staphylococcus aureus NOT DETECTED NOT DETECTED Final   Streptococcus species NOT DETECTED NOT DETECTED Final   Streptococcus agalactiae NOT DETECTED NOT DETECTED Final   Streptococcus pneumoniae NOT DETECTED NOT DETECTED Final   Streptococcus pyogenes NOT DETECTED NOT DETECTED Final   Acinetobacter baumannii NOT DETECTED NOT DETECTED Final   Enterobacteriaceae species DETECTED (A) NOT DETECTED Final    Comment: Enterobacteriaceae represent a large family of gram-negative bacteria, not a single organism. CRITICAL RESULT CALLED TO, READ BACK BY AND VERIFIED WITH: SHEEMA HALLAJI @ 1611 ON 03/05/2018 BY CAF    Enterobacter cloacae complex NOT DETECTED NOT DETECTED Final   Escherichia coli DETECTED (A) NOT DETECTED Final    Comment: CRITICAL RESULT CALLED TO, READ BACK BY AND VERIFIED WITH: SHEEMA HALLAJI @ 1611 ON 03/05/2018 BY CAF    Klebsiella oxytoca NOT DETECTED NOT DETECTED Final   Klebsiella pneumoniae NOT DETECTED NOT DETECTED Final   Proteus species NOT DETECTED NOT DETECTED Final   Serratia marcescens NOT DETECTED NOT DETECTED Final   Carbapenem resistance NOT DETECTED NOT DETECTED Final   Haemophilus influenzae NOT DETECTED NOT DETECTED Final   Neisseria meningitidis NOT DETECTED NOT DETECTED Final   Pseudomonas aeruginosa NOT DETECTED NOT DETECTED Final   Candida albicans NOT DETECTED NOT DETECTED Final   Candida glabrata NOT DETECTED NOT DETECTED Final   Candida  krusei NOT DETECTED NOT DETECTED Final   Candida parapsilosis NOT DETECTED NOT DETECTED Final   Candida tropicalis NOT DETECTED NOT DETECTED Final    Comment: Performed at [facility], [address] [place], [place] [address]  Urine culture     Status: None   Collection Time: 03/05/18  7:19 AM  Result Value Ref Range Status   Specimen Description   Final    URINE, RANDOM Performed at [facility], [address] [place], [place] [address]    Special Requests   Final    NONE Performed at [facility], [address] [place], [place] [address]    Culture   Final    NO GROWTH Performed at [facility], [address] [place], [place] [address]    Report Status 03/06/2018 FINAL  Final  MRSA PCR Screening     Status: None   Collection Time: 03/06/18 12:58 PM  Result Value Ref Range Status   MRSA by PCR NEGATIVE NEGATIVE Final    Comment:        The GeneXpert MRSA Assay (FDA approved for NASAL specimens only), is one component of a comprehensive MRSA colonization surveillance program. It is not intended to diagnose MRSA infection nor to guide or monitor treatment for MRSA infections. Performed at [facility], [address] [place], [place] [address]   Culture, blood (single) w Reflex to ID Panel     Status: None   Collection Time: 03/07/18  3:41 PM  Result Value Ref Range Status   Specimen Description BLOOD BLOOD LEFT HAND  Final   Special Requests   Final    BOTTLES DRAWN AEROBIC AND ANAEROBIC Blood Culture results may not be optimal due to an inadequate volume of blood received in culture bottles  Culture   Final    NO GROWTH 5 DAYS Performed at [facility], [address] [place], [place] [address]    Report Status 03/12/2018 FINAL  Final  C difficile quick scan w PCR reflex     Status: None   Collection Time: 03/09/18  2:44 PM  Result Value Ref Range Status   C Diff antigen NEGATIVE NEGATIVE Final   C  Diff toxin NEGATIVE NEGATIVE Final   C Diff interpretation No C. difficile detected.  Final    Comment: Performed at [facility], [address] [place], [place] [address]     Studies: No results found.  Scheduled Meds: . apixaban  5 mg Oral BID  . brimonidine  1 drop Both Eyes BID  . bumetanide  2 mg Oral Daily  . carvedilol  12.5 mg Oral BID WC  . ferrous sulfate  325 mg Oral BID  . hydrALAZINE  50 mg Oral TID  . insulin aspart  0-15 Units Subcutaneous TID WC  . insulin aspart  0-5 Units Subcutaneous QHS  . insulin glargine  20 Units Subcutaneous Daily  . mouth rinse  15 mL Mouth Rinse BID  . multivitamin with minerals  1 tablet Oral Daily   Continuous Infusions: .  ceFAZolin (ANCEF) IV 2 g (03/13/18 1450)  . magnesium sulfate 1 - 4 g bolus IVPB 2 g (03/13/18 1450)    Assessment/Plan:  1. Sepsis with E. coli with left foot osteomyelitis.  Patient will need 6 weeks of IV antibiotics.  PICC line  placed.  Debridement done on 7/17 by Dr. Alberteen Spindle.  Wound VAC change every 3 days.  Physical therapy to see the patient.  Nonweightbearing left foot.  Out to rehab tomorrow as. 2. Acute on chronic hypercarbic respiratory failure.  Patient on oxygen now 24/7. 3. Ileus has resolved 4. Chronic diastolic congestive heart failure.  Patient on Coreg , Bumex, and hydralazine 5. Chronic kidney disease stage III. 6. Chronic atrial fibrillation on Eliquis and Coreg 7. Morbid obesity with a BMI of 48.06 8. Weakness physical therapy recommends rehab 9. Type 2 diabetes mellitus.  Sugars trending up.  Increase Lantus to 20 units daily and continue to try trended up.  Also on sliding scale. 10.  nonsustained ventricular tachycardia.  Give 2 g of IV magnesium.  Code Status:     Code Status Orders  (From admission, onward)        Start     Ordered   03/05/18 1056  Full code  Continuous     03/05/18 1102    Code Status History    This patient has a current code status but no  historical code status.     Disposition Plan: Patient will go out to [facility] tomorrow.  Patient is nonweightbearing on the left foot and is morbidly obese and was unable to get around too much.  Consultants:  Podiatry  Nephrology  Cardiology  Procedures:  Left ankle debridement  Antibiotics:  Ancef  Time spent: 27 minutes.  Case discussed with wife at the bedside.   Stepheny Canal [employer]

## 2018-03-13 NOTE — Progress Notes (Signed)
PT refused to weat Bipap tonight. Stated he was breathing fine. Pt was instructed to call if he changed his mind.

## 2018-03-13 NOTE — Progress Notes (Signed)
Physical Therapy Treatment Patient Details Name: Alan Dillon MRN: [id number] DOB: [birth year] Today's Date: 03/13/2018    History of Present Illness 67 yo male with L heel I and D on 7/17 which was septic was referred to PT for mobility and limited to NWB on LLE.  Has had R heel wound for 4 years, B Charcot foot changes for many years.  PMHx:  SBO, HD, CKD, osteomyelitis, atherosclerosis, subacute rib fractures, a-fib, L5 pars fracture,     PT Comments    Pt agreeable to PT; reports 3/10 pain in L foot/LE and request to "not touch". Pt participates in BLE exercises with assist throughout. Pillow used to hold/assist LLE. Pt notes some hip pain with range R hip, which is new. Discussed probability from lack of movement; encouraged exercises. 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 Continue PT to progress strength and endurance to improve functional mobility while maintaining LLE non weight bearing.    Follow Up Recommendations        Equipment Recommendations       Recommendations for Other Services       Precautions / Restrictions Precautions Precautions: Fall Restrictions Weight Bearing Restrictions: Yes LLE Weight Bearing: Non weight bearing    Mobility  Bed Mobility               General bed mobility comments: Needs trendelenburg bed to adjust upward in bed; Unable to attain trapeze at this point  Transfers                    Ambulation/Gait                 Stairs             Wheelchair Mobility    Modified Rankin (Stroke Patients Only)       Balance                                            Cognition Arousal/Alertness: Awake/alert Behavior During Therapy: WFL for tasks assessed/performed Overall Cognitive Status: Within Functional Limits for tasks assessed                                        Exercises General  Exercises - Lower Extremity Ankle Circles/Pumps: AROM;Both;20 reps;Supine Quad Sets: Strengthening;Both;20 reps;Supine Gluteal Sets: Strengthening;Both;20 reps;Supine Heel Slides: AAROM;Both;20 reps;Supine Hip ABduction/ADduction: AAROM;Both;20 reps;Supine    General Comments        Pertinent Vitals/Pain Pain Assessment: 0-10 Pain Score: 3  Pain Location: L Foot Pain Descriptors / Indicators: Operative site guarding;Tender(unable to tolerate touch)    Home Living                      Prior Function            PT Goals (current goals can now be found in the care plan section) Progress towards PT goals: Not progressing toward goals - comment    Frequency    Min 2X/week      PT Plan Current plan remains appropriate    Co-evaluation              AM-PAC PT "6 Clicks" Daily Activity  Outcome Measure  Difficulty turning over in bed (including adjusting bedclothes, sheets and blankets)?: Unable Difficulty moving from lying on back to sitting on the side of the bed? : Unable Difficulty sitting down on and standing up from a chair with arms (e.g., wheelchair, bedside commode, etc,.)?: Unable Help needed moving to and from a bed to chair (including a wheelchair)?: Total Help needed walking in hospital room?: Total Help needed climbing 3-5 steps with a railing? : Total 6 Click Score: 6    End of Session   Activity Tolerance: Patient limited by fatigue;Patient limited by pain Patient left: in bed;with call bell/phone within reach;with bed alarm set;with family/visitor present   PT Visit Diagnosis: Muscle weakness (generalized) (M62.81);Other abnormalities of gait and mobility (R26.89);Pain Pain - Right/Left: Left Pain - part of body: Ankle and joints of foot     Time: 1410-1430 PT Time Calculation (min) (ACUTE ONLY): 20 min  Charges:  $Therapeutic Exercise: 8-22 mins                    G Codes:        Scot Dock, PTA 03/13/2018, 3:46 PM

## 2018-03-13 NOTE — [place] FL2 (Signed)
[place] MEDICAID FL2 LEVEL OF CARE SCREENING TOOL     IDENTIFICATION  Patient Name: Alan Dillon Birthdate: December 02, 1950 Sex: male Admission Date (Current Location): 03/05/2018  [place] and [place] Number:  Chiropodist and Address:  [facility], [address] [place], [place] [address]      Provider Number: [id number]  Attending Physician Name and Address:  Alford Highland, MD  Relative Name and Phone Number:  Saba Neuman (Spouse) [phone]    Current Level of Care: Hospital Recommended Level of Care: Skilled Nursing Facility Prior Approval Number:    Date Approved/Denied:   PASRR Number: [id number]  Discharge Plan: SNF    Current Diagnoses: Patient Active Problem List   Diagnosis Date Noted  . Sepsis (HCC) 03/05/2018  . Osteomyelitis of ankle or foot, acute, left (HCC)   . Atrial fibrillation, chronic (HCC)   . 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 Hypertension     Orientation RESPIRATION BLADDER Height & Weight     Self, Time, Situation, Place  O2(2L) Continent Weight: (!) 351 lb 10.1 oz (159.5 kg) Height:  6\' 1"  (185.4 cm)  BEHAVIORAL SYMPTOMS/MOOD NEUROLOGICAL BOWEL NUTRITION STATUS      Incontinent Diet  AMBULATORY STATUS COMMUNICATION OF NEEDS Skin   Limited Assist Verbally Wound Vac                       Personal Care Assistance Level of Assistance  Bathing, Feeding, Dressing Bathing Assistance: Limited assistance Feeding assistance: Independent Dressing Assistance: Limited assistance     Functional Limitations Info  Sight, Hearing, Speech Sight Info: Adequate Hearing Info: Adequate Speech Info: Adequate    SPECIAL CARE FACTORS FREQUENCY  PT (By licensed PT), OT (By licensed OT)     PT Frequency: 5x a week OT Frequency: 5x a week            Contractures Contractures Info: Not present    Additional Factors Info  Code Status, Allergies, Insulin  Sliding Scale Code Status Info: Full Code Allergies Info: BACTRIM SULFAMETHOXAZOLE-TRIMETHOPRIM    Insulin Sliding Scale Info: insulin aspart (novoLOG) injection 0-15 Units 3x a day with meals       Current Medications (03/13/2018):  This is the current hospital active medication list Current Facility-Administered Medications  Medication Dose Route Frequency Provider Last Rate Last Dose  . acetaminophen (TYLENOL) tablet 650 mg  650 mg Oral Q6H PRN Linus Galas, DPM   650 mg at 03/12/18 2147   Or  . acetaminophen (TYLENOL) suppository 650 mg  650 mg Rectal Q6H PRN Linus Galas, DPM      . apixaban Everlene Balls) tablet 5 mg  5 mg Oral BID Cammy Copa, MD   5 mg at 03/13/18 0844  . brimonidine (ALPHAGAN) 0.2 % ophthalmic solution 1 drop  1 drop Both Eyes BID Linus Galas, DPM   1 drop at 03/13/18 1052  . bumetanide (BUMEX) tablet 2 mg  2 mg Oral Daily Alford Highland, MD   2 mg at 03/13/18 0951  . carvedilol (COREG) tablet 12.5 mg  12.5 mg Oral BID WC Mayo, Allyn Kenner, MD   12.5 mg at 03/13/18 0844  . ceFAZolin (ANCEF) IVPB 2g/100 mL premix  2 g Intravenous Q8H Linus Galas, DPM   Stopped at 03/13/18 [phone]  . ferrous sulfate tablet 325 mg  325 mg Oral BID Linus Galas, DPM   325 mg at 03/13/18 6295  . hydrALAZINE (APRESOLINE) tablet  50 mg  50 mg Oral TID Alford HighlandWieting, Richard, MD      . insulin aspart (novoLOG) injection 0-15 Units  0-15 Units Subcutaneous TID WC Cammy CopaMaier, Angela, MD   5 Units at 03/13/18 [phone]  . insulin aspart (novoLOG) injection 0-5 Units  0-5 Units Subcutaneous QHS Cammy CopaMaier, Angela, MD   2 Units at 03/12/18 2120  . insulin glargine (LANTUS) injection 20 Units  20 Units Subcutaneous Daily Alford HighlandWieting, Richard, MD   20 Units at 03/13/18 1052  . ipratropium-albuterol (DUONEB) 0.5-2.5 (3) MG/3ML nebulizer solution 3 mL  3 mL Nebulization Q6H PRN Alford HighlandWieting, Richard, MD      . MEDLINE mouth rinse  15 mL Mouth Rinse BID Linus Galasline, Todd, DPM   15 mL at 03/13/18 0956  . multivitamin with minerals tablet 1 tablet   1 tablet Oral Daily Linus Galasline, Todd, DPM   1 tablet at 03/13/18 [phone]  . ondansetron (ZOFRAN) tablet 4 mg  4 mg Oral Q6H PRN Linus Galasline, Todd, DPM       Or  . ondansetron [facility](ZOFRAN) injection 4 mg  4 mg Intravenous Q6H PRN Linus Galasline, Todd, DPM   4 mg at 03/06/18 1006  . oxyCODONE-acetaminophen (PERCOCET/ROXICET) 5-325 MG per tablet 1-2 tablet  1-2 tablet Oral Q4H PRN Linus Galasline, Todd, DPM   1 tablet at 03/11/18 2232  . phenol (CHLORASEPTIC) mouth spray 1 spray  1 spray Mouth/Throat PRN Linus Galasline, Todd, DPM      . promethazine (PHENERGAN) injection 12.5 mg  12.5 mg Intravenous Q6H PRN Linus Galasline, Todd, DPM   12.5 mg at 03/06/18 2014     Discharge Medications: Please see discharge summary for a list of discharge medications.  Relevant Imaging Results:  Relevant Lab Results:   Additional Information IV ABX (6 weeks) ceFAZolin (ANCEF) IVPB 2g/100 mL premix  VW#[id number]  Natalyia Innes, Ervin KnackEric R, LCSWA

## 2018-03-13 NOTE — Care Management Important Message (Signed)
Copy of signed IM left with patient in room.  

## 2018-03-13 NOTE — Progress Notes (Signed)
Pt  Had 8 beat salvo of v tack. Dr. Renae Glosswieting notified. Orders received for  2 gm run of mags04

## 2018-03-13 NOTE — Clinical Social Work Note (Signed)
CSW presented bed offers to patient and his wife, they chose [facility], [facility] contacted [facility] and [facility] can accept patient on Tuesday if he is medically ready for discharge and orders have been received.  [facility] will order patient's wound vac, and IV antibiotics.  CSW updated bedside nurse, and physician, CSW to continue to follow patient's progress throughout discharge planning.  Ervin KnackEric R. Hassan Rowannterhaus, MSW, Theresia MajorsLCSWA [phone]  03/13/2018 3:33 PM

## 2018-03-13 NOTE — Progress Notes (Signed)
I spoke with Dr. Hilton SinclairWeiting this am and the patient may be placed in rehab today or tomorrow.  He needs Wound vac continued at 125 continuous and changed M_W_F.Marland Kitchen.  Return to see Dr. Alberteen Spindleline in 2 weeks.

## 2018-03-13 NOTE — Consult Note (Signed)
PHARMACY CONSULT NOTE FOR:  OUTPATIENT  PARENTERAL ANTIBIOTIC THERAPY (OPAT)  Indication: Osteomyelitis Regimen: Cefazolin 2g q 8hr End date: 04/16/18  IV antibiotic discharge orders are pended. To discharging provider:  please sign these orders via discharge navigator,  Select New Orders & click on the button choice - Manage This Unsigned Work.     Thank you for allowing pharmacy to be a part of this patient's care.  Olene FlossMelissa D Adonte Vanriper, Pharm.D, BCPS Clinical Pharmacist  03/13/2018, 12:13 PM

## 2018-03-14 LAB — GLUCOSE, CAPILLARY
GLUCOSE-CAPILLARY: 274 mg/dL — AB (ref 70–99)
GLUCOSE-CAPILLARY: 276 mg/dL — AB (ref 70–99)

## 2018-03-14 LAB — SURGICAL PATHOLOGY

## 2018-03-14 MED ORDER — CEFAZOLIN IV (FOR PTA / DISCHARGE USE ONLY): 2.0000 g | Freq: Three times a day (TID) | INTRAVENOUS | 0 refills | Status: AC

## 2018-03-14 MED ORDER — OXYCODONE-ACETAMINOPHEN 5-325 MG PO TABS
1.0000 | ORAL_TABLET | Freq: Four times a day (QID) | ORAL | 0 refills | Status: AC | PRN
Start: 2018-03-14 — End: ?

## 2018-03-14 MED ORDER — HYDRALAZINE HCL 50 MG PO TABS: 50.0000 mg | ORAL_TABLET | Freq: Three times a day (TID) | ORAL | 0 refills | Status: AC

## 2018-03-14 MED ORDER — INSULIN ASPART 100 UNIT/ML ~~LOC~~ SOLN: 8.0000 [IU] | Freq: Three times a day (TID) | SUBCUTANEOUS | 0 refills | Status: AC

## 2018-03-14 MED ORDER — INSULIN ASPART 100 UNIT/ML ~~LOC~~ SOLN
6.0000 [IU] | Freq: Three times a day (TID) | SUBCUTANEOUS | Status: DC
  Administered 2018-03-14 (×2): 6 [IU] via SUBCUTANEOUS
  Filled 2018-03-14 (×2): qty 1

## 2018-03-14 MED ORDER — INSULIN GLARGINE 100 UNIT/ML ~~LOC~~ SOLN
28.0000 [IU] | Freq: Every day | SUBCUTANEOUS | Status: DC
  Administered 2018-03-14: 28 [IU] via SUBCUTANEOUS
  Filled 2018-03-14 (×2): qty 0.28

## 2018-03-14 MED ORDER — BUMETANIDE 2 MG PO TABS: 2.0000 mg | ORAL_TABLET | Freq: Every day | ORAL | 0 refills | Status: AC

## 2018-03-14 MED ORDER — INSULIN GLARGINE 100 UNIT/ML ~~LOC~~ SOLN: 28.0000 [IU] | Freq: Every day | SUBCUTANEOUS | 0 refills | Status: AC

## 2018-03-14 MED ORDER — HYDRALAZINE HCL 50 MG PO TABS: 50.0000 mg | ORAL_TABLET | Freq: Three times a day (TID) | ORAL | 0 refills | Status: DC

## 2018-03-14 MED ORDER — SODIUM CHLORIDE 0.9% FLUSH
10.0000 mL | Freq: Two times a day (BID) | INTRAVENOUS | Status: DC
  Administered 2018-03-14: 10 mL

## 2018-03-14 MED ORDER — SODIUM CHLORIDE 0.9% FLUSH: 10.0000 mL | Freq: Two times a day (BID) | INTRAVENOUS | Status: AC

## 2018-03-14 MED ORDER — SODIUM CHLORIDE 0.9% FLUSH
10.0000 mL | Freq: Two times a day (BID) | INTRAVENOUS | Status: DC
Start: 2018-03-14 — End: 2018-03-14
  Administered 2018-03-14: 10 mL via INTRAVENOUS

## 2018-03-14 MED ORDER — SODIUM CHLORIDE 0.9% FLUSH: 10.0000 mL | INTRAVENOUS | Status: DC | PRN

## 2018-03-14 NOTE — Progress Notes (Signed)
Discharged to [facility] for an extended course of IV antibiotics.  Wound vac DC'd. Replaced with a wet to dry dressing.  Would Vac will be reinitiated at [facility].  Report given to [employer]Kim RN.  PICC line in place and functioning well.  Tranported by EMS.

## 2018-03-14 NOTE — Clinical Social Work Note (Signed)
Patient is medically ready for discharge to [facility] today. CSW notified patient of discharge today. CSW also notified Tammy, admissions coordinator at [facility] of discharge today. Tammy states that they have all necessary equipment for patient. Patient will be transported by EMS. RN to call report and call for transport.   Ruthe Mannanandace Vernard Gram MSW, [address]WA [phone]

## 2018-03-14 NOTE — Discharge Summary (Signed)
[facility]: [facility]    MR#:  [id number]  DATE OF BIRTH:  1950-11-10  DATE OF ADMISSION:  03/05/2018 ADMITTING PHYSICIAN: Epifanio Lesches, MD  DATE OF DISCHARGE: 03/14/2018  PRIMARY CARE PHYSICIAN: Neta Ehlers, MD    ADMISSION DIAGNOSIS:  Ulcer of left foot ([place]) [L97.529] Cellulitis of left lower extremity [L03.116] Sepsis, due to unspecified organism [facility]) [A41.9] Osteomyelitis of left foot, unspecified type ([place]) [M86.9] Sepsis ([facility]) [A41.9]  DISCHARGE DIAGNOSIS:  Active Problems:   Sepsis ([facility])   Osteomyelitis of ankle or foot, acute, left (HCC)   Atrial fibrillation, chronic ([facility])   Diabetic ulcer of ankle associated with type 2 diabetes mellitus ([name])   Hypertension   SECONDARY DIAGNOSIS:   Past Medical History:  Diagnosis Date  . Atrial fibrillation, chronic (HCC)    On apixaban  . CKD (chronic kidney disease), stage III ([place])   . Diabetes mellitus type 2 in obese ([facility])   . 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 History of hemodialysis    Bactrim mediated  Acute renal failure  . Hypertension   . Morbid obesity with BMI of 45.0-49.9, adult ([facility])   . Osteomyelitis of ankle or foot, acute, left ([facility])    chronic    [facility] COURSE:   1.  Sepsis with E. coli and left foot osteomyelitis.  Patient will need 6 weeks of IV antibiotics.  Ancef will run through August 25.  Debridement done by Dr. Cleda Mccreedy podiatry on 03/08/2018.  Wound VAC to be changed every 3 days.  Physical therapy recommended rehab.  Patient is still nonweightbearing on the left foot.  Patient will go out to rehab today for IV antibiotics.  PICC line care as per protocol.  PICC line can come out after the end of antibiotic therapy. 2.  Acute on chronic hypercarbic respiratory failure.  Patient now on oxygen 2 L 24/7.  Patient required BiPAP initially. 3.  Ileus while in [facility].This  has resolved and now tolerating diet. 4.  Chronic diastolic congestive heart failure.  Patient on Coreg, Bumex and hydralazine 5.  Chronic kidney disease stage III.  Continue to monitor with diuresis as outpatient. 6.  Chronic atrial fibrillation on Eliquis for anticoagulation and Coreg for rate control. 7.  Morbid obesity with a BMI of 46.12.  Weight loss needed 8.  Weakness.  Physical therapy recommends rehab 9.  Type 2 diabetes mellitus.  Sugars trending up since the patient is eating better.  Increase Lantus to 28 units daily and give NovoLog 8 units prior to meals.  Check fingersticks q. before meals and nightly.  Since the patient was on Tresiba as outpatient may have to increase Lantus  at the rehab. 10.  A few beats of nonsustained ventricular tachycardia given IV magnesium yesterday.  Echocardiogram showed normal ejection fraction.  Laboratory data on a weekly basis while on antibiotics.    DISCHARGE CONDITIONS:   Satisfactory  CONSULTS OBTAINED:  Treatment Team:  Algernon Huxley, MD Anthonette Legato, MD  DRUG ALLERGIES:   Allergies  Allergen Reactions  . Bactrim [Sulfamethoxazole-Trimethoprim] Other (See Comments)    Caused kidney failure    DISCHARGE MEDICATIONS:   Allergies as of 03/14/2018      Reactions   Bactrim [sulfamethoxazole-trimethoprim] Other (See Comments)   Caused kidney failure      Medication List    STOP taking these medications  atorvastatin 10 MG tablet Commonly known as:  LIPITOR   metFORMIN 500 MG 24 hr tablet Commonly known as:  GLUCOPHAGE-XR   NOVOLOG FLEXPEN 100 UNIT/ML FlexPen Generic drug:  insulin aspart Replaced by:  insulin aspart 100 UNIT/ML injection   TRESIBA FLEXTOUCH 200 UNIT/ML Sopn Generic drug:  Insulin Degludec     TAKE these medications   acetaminophen 500 MG tablet Commonly known as:  TYLENOL Take 500 mg by mouth every 6 (six) hours as needed for mild pain or fever.   albuterol 108 (90 Base) MCG/ACT  inhaler Commonly known as:  PROVENTIL HFA;VENTOLIN HFA Inhale 2 puffs into the lungs 2 (two) times daily.   ammonium lactate 12 % lotion Commonly known as:  LAC-HYDRIN Apply 1 application topically as needed for dry skin (on legs).   brimonidine 0.2 % ophthalmic solution Commonly known as:  ALPHAGAN Place 1 drop into both eyes 2 (two) times daily.   bumetanide 2 MG tablet Commonly known as:  BUMEX Take 1 tablet (2 mg total) by mouth daily. What changed:  when to take this   carvedilol 12.5 MG tablet Commonly known as:  COREG Take 12.5 mg by mouth 2 (two) times daily.   ceFAZolin IVPB Commonly known as:  ANCEF Inject 2 g into the vein every 8 (eight) hours. Indication:  Ecoli Osteomyelitis Last Day of Therapy:  04/16/18 Labs - Once weekly:  CBC/D and BMP, Labs - Every other week:  ESR and CRP   cetirizine 10 MG tablet Commonly known as:  ZYRTEC Take 10 mg by mouth daily.   clotrimazole-betamethasone cream Commonly known as:  LOTRISONE Apply 1 application topically 2 (two) times daily as needed (to skin folds).   D3-1000 1000 units capsule Generic drug:  Cholecalciferol Take 1,000 Units by mouth daily.   ELIQUIS 5 MG Tabs tablet Generic drug:  apixaban Take 5 mg by mouth 2 (two) times daily.   famotidine 20 MG tablet Commonly known as:  PEPCID Take 20 mg by mouth every evening.   ferrous sulfate 325 (65 FE) MG tablet Take 325 mg by mouth 2 (two) times daily.   gabapentin 300 MG capsule Commonly known as:  NEURONTIN Take 300 mg by mouth 2 (two) times daily.   hydrALAZINE 50 MG tablet Commonly known as:  APRESOLINE Take 1 tablet (50 mg total) by mouth 3 (three) times daily. What changed:    medication strength  how much to take   insulin aspart 100 UNIT/ML injection Commonly known as:  novoLOG Inject 8 Units into the skin 3 (three) times daily with meals. Replaces:  NOVOLOG FLEXPEN 100 UNIT/ML FlexPen   insulin glargine 100 UNIT/ML injection Commonly  known as:  LANTUS Inject 0.28 mLs (28 Units total) into the skin daily.   multivitamin with minerals tablet Take 1 tablet by mouth daily.   oxyCODONE-acetaminophen 5-325 MG tablet Commonly known as:  PERCOCET/ROXICET Take 1 tablet by mouth every 6 (six) hours as needed for severe pain.   PROBIOTIC PO Take 1 tablet by mouth daily.            Home Infusion Instuctions  (From admission, onward)        Start     Ordered   03/14/18 0000  Home infusion instructions [facility] May follow [place] Dosing Protocol; May administer Cathflo as needed to maintain patency of vascular access device.; [place] of vascular access device: per [facility] Protocol: 0.9% NaCl pre/post medica...    Question Answer Comment  Instructions May  follow [place] Dosing Protocol   Instructions May administer Cathflo as needed to maintain patency of vascular access device.   Instructions [place] of vascular access device: per [facility] Protocol: 0.9% NaCl pre/post medication administration and prn patency; Heparin 100 u/ml, 2m for implanted ports and Heparin 10u/ml, 574mfor all other central venous catheters.   Instructions May follow [facility] Anaphylaxis Protocol for First Dose Administration in the home: 0.9% NaCl at 25-50 ml/hr to maintain IV access for protocol meds. Epinephrine 0.3 ml IV/IM PRN and Benadryl 25-50 IV/IM PRN s/s of anaphylaxis.   Instructions [facility] Infusion Coordinator (RN) to assist per patient IV care needs in the home PRN.      03/14/18 0751       DISCHARGE INSTRUCTIONS:   Follow-up with Dr. rehab 1 day Follow-up with Dr. ClCleda Mccreedyodiatry 2 weeks  If you experience worsening of your admission symptoms, develop shortness of breath, life threatening emergency, suicidal or homicidal thoughts you must seek medical attention immediately by calling 911 or calling your MD immediately  if symptoms less severe.  You Must read complete instructions/literature along with all the  possible adverse reactions/side effects for all the Medicines you take and that have been prescribed to you. Take any new Medicines after you have completely understood and accept all the possible adverse reactions/side effects.   Please note  You were cared for by a hospitalist during your [facility] stay. If you have any questions about your discharge medications or the care you received while you were in the [facility] after you are discharged, you can call the unit and asked to speak with the hospitalist on call if the hospitalist that took care of you is not available. Once you are discharged, your primary care physician will handle any further medical issues. Please note that NO REFILLS for any discharge medications will be authorized once you are discharged, as it is imperative that you return to your primary care physician (or establish a relationship with a primary care physician if you do not have one) for your aftercare needs so that they can reassess your need for medications and monitor your lab values.    Today   CHIEF COMPLAINT:   Chief Complaint  Patient presents with  . Weakness    HISTORY OF PRESENT ILLNESS:  JaThaddeaus Monicais a [age].o. male  presented with weakness and found to have sepsis and osteomyelitis.   VITAL SIGNS:  Blood pressure (!) 156/69, pulse 70, temperature 97.9 F (36.6 C), temperature source Oral, resp. rate 20, height 6' 1"  (1.854 m), weight (!) 158.6 kg (349 lb 9.6 oz), SpO2 96 %.    PHYSICAL EXAMINATION:  GENERAL:  [age].o.-year-old patient lying in the bed with no acute distress.  EYES: Pupils equal, round, reactive to light and accommodation. No scleral icterus. Extraocular muscles intact.  HEENT: Head atraumatic, normocephalic. Oropharynx and nasopharynx clear.  NECK:  Supple, no jugular venous distention. No thyroid enlargement, no tenderness.  LUNGS: Normal breath sounds bilaterally, no wheezing, rales,rhonchi or crepitation. No use of accessory  muscles of respiration.  CARDIOVASCULAR: S1, S2 normal. No murmurs, rubs, or gallops.  ABDOMEN: Soft, non-tender, non-distended. Bowel sounds present. No organomegaly or mass.  EXTREMITIES:  3+pedal edema.  No cyanosis, or clubbing.  NEUROLOGIC: Cranial nerves II through XII are intact. Muscle strength 5/5 in all extremities. Sensation intact. Gait not checked.  PSYCHIATRIC: The patient is alert and oriented x 3.  SKIN: Chronic lower extremity skin discoloration.  Left foot and ankle covered with bandage and wound VAC  DATA REVIEW:   CBC Recent Labs  Lab 03/11/18 0716  WBC 9.2  HGB 11.2*  HCT 33.9*  PLT 256    Chemistries  Recent Labs  Lab 03/11/18 0716  NA 138  K 4.0  CL 97*  CO2 33*  GLUCOSE 200*  BUN 25*  CREATININE 1.23  CALCIUM 8.5*     Microbiology Results  Results for orders placed or performed during the [facility] encounter of 03/05/18  Blood Culture (routine x 2)     Status: Abnormal   Collection Time: 03/05/18  6:47 AM  Result Value Ref Range Status   Specimen Description   Final    BLOOD RIGHT FOREARM Performed at [facility], [address], [place], [place] [address]    Special Requests   Final    BOTTLES DRAWN AEROBIC AND ANAEROBIC Blood Culture adequate volume Performed at [facility], [address] [place], [place] [address]    Culture  Setup Time   Final    GRAM NEGATIVE RODS IN BOTH AEROBIC AND ANAEROBIC BOTTLES CRITICAL RESULT CALLED TO, READ BACK BY AND VERIFIED WITH: SHEEMA HALLAJI @ 3500 ON 03/05/2018 BY CAF Performed at [facility], [address] [place], [place] [address]    Culture (A)  Final    ESCHERICHIA COLI SUSCEPTIBILITIES PERFORMED ON PREVIOUS CULTURE WITHIN THE LAST 5 DAYS. Performed at [facility], [place] [address], [place], [place] [address]    Report Status 03/07/2018 FINAL  Final  Blood Culture (routine x 2)     Status: Abnormal   Collection Time: 03/05/18  6:49 AM  Result Value  Ref Range Status   Specimen Description   Final    BLOOD RIGHT HAND Performed at [facility], [address] [place], [place] [address]    Special Requests   Final    BOTTLES DRAWN AEROBIC AND ANAEROBIC Blood Culture results may not be optimal due to an excessive volume of blood received in culture bottles Performed at [facility], [address] [place], [place] [address]    Culture  Setup Time   Final    IN BOTH AEROBIC AND ANAEROBIC BOTTLES GRAM NEGATIVE RODS CRITICAL RESULT CALLED TO, READ BACK BY AND VERIFIED WITH: SHEEMA HALLAJI @ 1611 ON 03/05/2018 BY CAF Performed at [facility], [place] [address] [place], [place] [address]    Culture ESCHERICHIA COLI (A)  Final   Report Status 03/07/2018 FINAL  Final   Organism ID, Bacteria ESCHERICHIA COLI  Final      Susceptibility   Escherichia coli - MIC*    AMPICILLIN >=32 RESISTANT Resistant     CEFAZOLIN <=4 SENSITIVE Sensitive     CEFEPIME <=1 SENSITIVE Sensitive     CEFTAZIDIME <=1 SENSITIVE Sensitive     CEFTRIAXONE <=1 SENSITIVE Sensitive     CIPROFLOXACIN >=4 RESISTANT Resistant     GENTAMICIN <=1 SENSITIVE Sensitive     IMIPENEM <=0.25 SENSITIVE Sensitive     TRIMETH/SULFA >=320 RESISTANT Resistant     AMPICILLIN/SULBACTAM 16 INTERMEDIATE Intermediate     PIP/TAZO <=4 SENSITIVE Sensitive     Extended ESBL NEGATIVE Sensitive     * ESCHERICHIA COLI  Blood Culture ID Panel (Reflexed)     Status: Abnormal   Collection Time: 03/05/18  6:49 AM  Result Value Ref Range Status   Enterococcus species NOT DETECTED NOT DETECTED Final   Listeria monocytogenes NOT DETECTED NOT  DETECTED Final   Staphylococcus species NOT DETECTED NOT DETECTED Final   Staphylococcus aureus NOT DETECTED NOT DETECTED Final   Streptococcus species NOT DETECTED NOT DETECTED Final   Streptococcus agalactiae NOT DETECTED NOT DETECTED Final   Streptococcus pneumoniae NOT DETECTED NOT DETECTED Final   Streptococcus pyogenes NOT  DETECTED NOT DETECTED Final   Acinetobacter baumannii NOT DETECTED NOT DETECTED Final   Enterobacteriaceae species DETECTED (A) NOT DETECTED Final    Comment: Enterobacteriaceae represent a large family of gram-negative bacteria, not a single organism. CRITICAL RESULT CALLED TO, READ BACK BY AND VERIFIED WITH: SHEEMA HALLAJI @ 4431 ON 03/05/2018 BY CAF    Enterobacter cloacae complex NOT DETECTED NOT DETECTED Final   Escherichia coli DETECTED (A) NOT DETECTED Final    Comment: CRITICAL RESULT CALLED TO, READ BACK BY AND VERIFIED WITH: SHEEMA HALLAJI @ 5400 ON 03/05/2018 BY CAF    Klebsiella oxytoca NOT DETECTED NOT DETECTED Final   Klebsiella pneumoniae NOT DETECTED NOT DETECTED Final   Proteus species NOT DETECTED NOT DETECTED Final   Serratia marcescens NOT DETECTED NOT DETECTED Final   Carbapenem resistance NOT DETECTED NOT DETECTED Final   Haemophilus influenzae NOT DETECTED NOT DETECTED Final   Neisseria meningitidis NOT DETECTED NOT DETECTED Final   Pseudomonas aeruginosa NOT DETECTED NOT DETECTED Final   Candida albicans NOT DETECTED NOT DETECTED Final   Candida glabrata NOT DETECTED NOT DETECTED Final   Candida krusei NOT DETECTED NOT DETECTED Final   Candida parapsilosis NOT DETECTED NOT DETECTED Final   Candida tropicalis NOT DETECTED NOT DETECTED Final    Comment: Performed at [facility], [address], [place], [place] [address]  Urine culture     Status: None   Collection Time: 03/05/18  7:19 AM  Result Value Ref Range Status   Specimen Description   Final    URINE, RANDOM Performed at [facility], [address], [place], [place] [address]    Special Requests   Final    NONE Performed at [facility], [address] [place], [place] [address]    Culture   Final    NO GROWTH Performed at [facility], [place] [address], [place], [place] [address]    Report Status 03/06/2018 FINAL  Final  MRSA PCR Screening     Status: None    Collection Time: 03/06/18 12:58 PM  Result Value Ref Range Status   MRSA by PCR NEGATIVE NEGATIVE Final    Comment:        The GeneXpert MRSA Assay (FDA approved for NASAL specimens only), is one component of a comprehensive MRSA colonization surveillance program. It is not intended to diagnose MRSA infection nor to guide or monitor treatment for MRSA infections. Performed at [facility], [address] [place], [place] [address]   Culture, blood (single) w Reflex to ID Panel     Status: None   Collection Time: 03/07/18  3:41 PM  Result Value Ref Range Status   Specimen Description BLOOD BLOOD LEFT HAND  Final   Special Requests   Final    BOTTLES DRAWN AEROBIC AND ANAEROBIC Blood Culture results may not be optimal due to an inadequate volume of blood received in culture bottles   Culture   Final    NO GROWTH 5 DAYS Performed at [facility], [address] [place], [place] [address]    Report Status 03/12/2018 FINAL  Final  C difficile quick scan w PCR reflex  Status: None   Collection Time: 03/09/18  2:44 PM  Result Value Ref Range Status   C Diff antigen NEGATIVE NEGATIVE Final   C Diff toxin NEGATIVE NEGATIVE Final   C Diff interpretation No C. difficile detected.  Final    Comment: Performed at [facility], [address], [place], [place] [address]     Management plans discussed with the patient, family and they are in agreement.  CODE STATUS:     Code Status Orders  (From admission, onward)        Start     Ordered   03/05/18 1056  Full code  Continuous     03/05/18 1102    Code Status History    This patient has a current code status but no historical code status.      TOTAL TIME TAKING CARE OF THIS PATIENT: 35 minutes.    Loletha Grayer M.D on 03/14/2018 at 7:54 AM  Between 7am to 6pm - Pager - [phone]  After 6pm go to [email or web address] - password [email or web address]   [phone]  CC: Primary care physician; Neta Ehlers, MD

## 2019-03-23 IMAGING — RF DG UGI W/ GASTROGRAFIN
7 series · 7 of 7 positions shown · non-contrast
Comparison: 03/08/2018.

CLINICAL DATA: Possible ileus.  NG tube pole yesterday.

EXAM:
UPPER GI SERIES WITHOUT KUB
TECHNIQUE: Routine upper GI series was performed with water-soluble barium.
FLUOROSCOPY TIME:  Fluoroscopy Time:  0 minutes 48 seconds
Radiation Exposure Index (if provided by the fluoroscopic device):
28 mGy
Number of Acquired Spot Images: 7

[Series 1: fluoro_barium 2fps_bw · 0.18mm/px · 1 of 1 slices shown (1 of 4)]
[im 1/1]
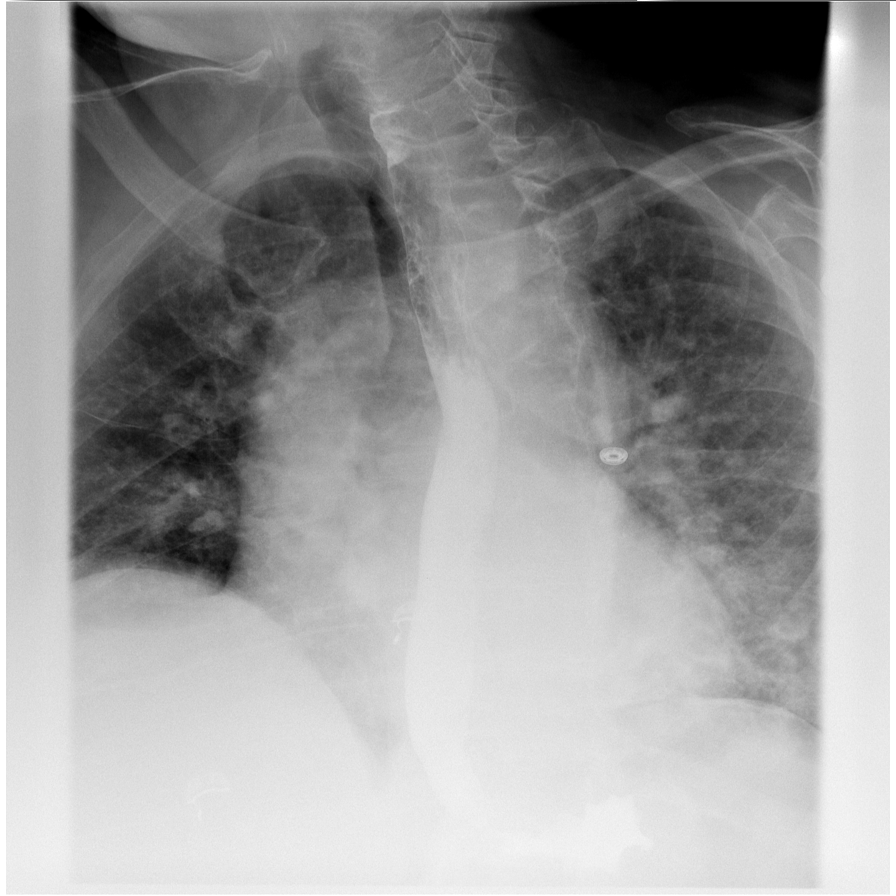

[Series 1: fluoro_barium singleshot_bw · 0.18mm/px · 1 of 1 slices shown (1 of 3)]
[im 1/1]
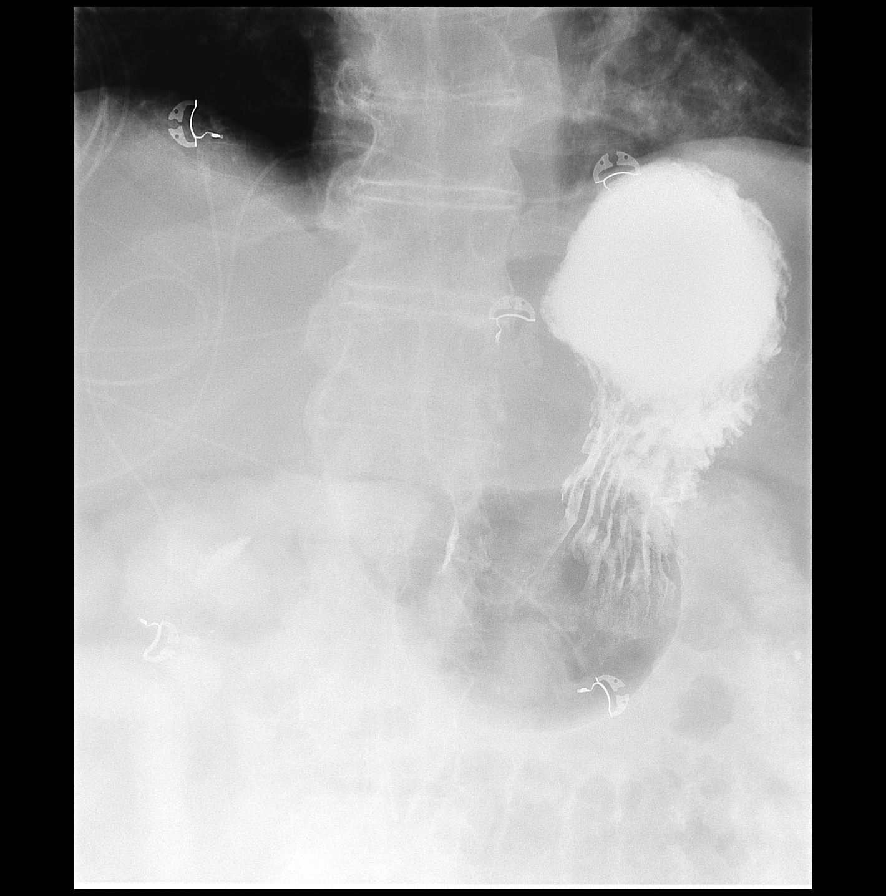

[Series 2: fluoro_barium singleshot_bw · 0.18mm/px · 1 of 1 slices shown (2 of 3)]
[im 1/1]
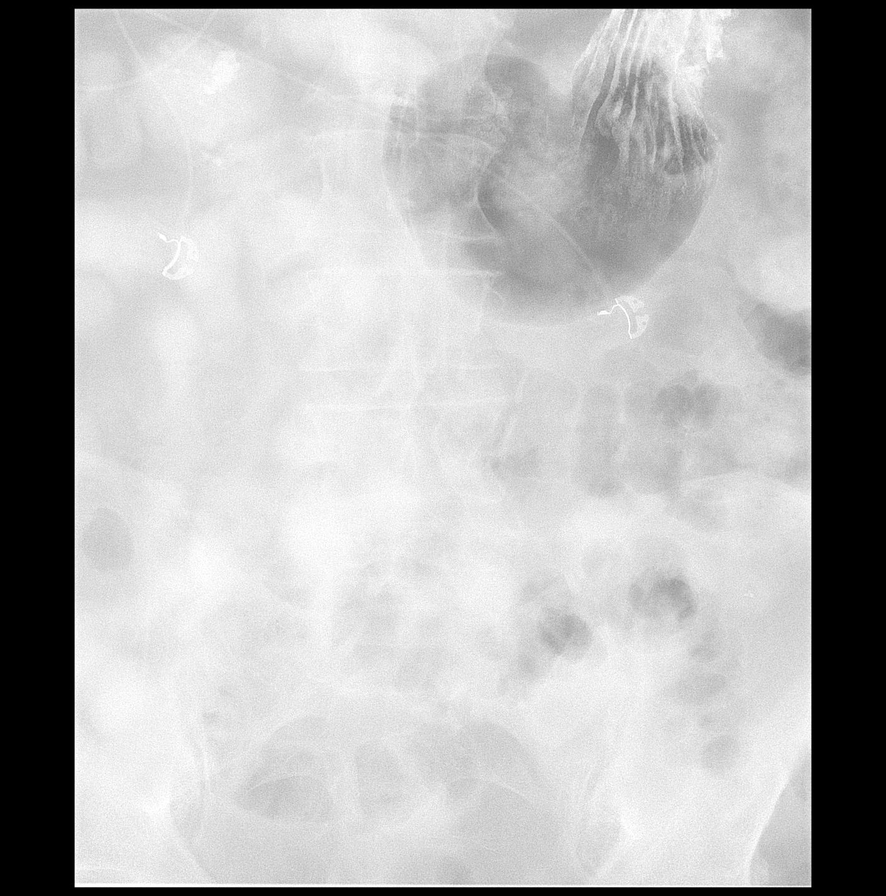

[Series 2: fluoro_barium 2fps_bw · 0.18mm/px · 1 of 1 slices shown (2 of 4)]
[im 1/1]
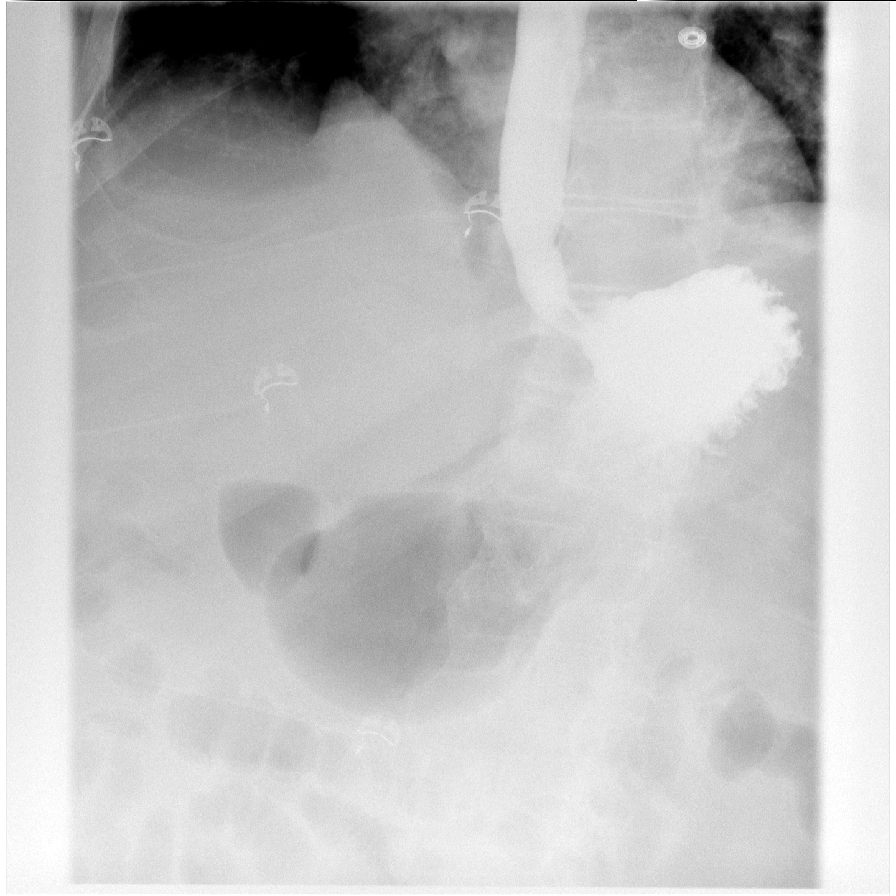

[Series 3: fluoro_barium singleshot_bw · 0.18mm/px · 1 of 1 slices shown (3 of 3)]
[im 1/1]
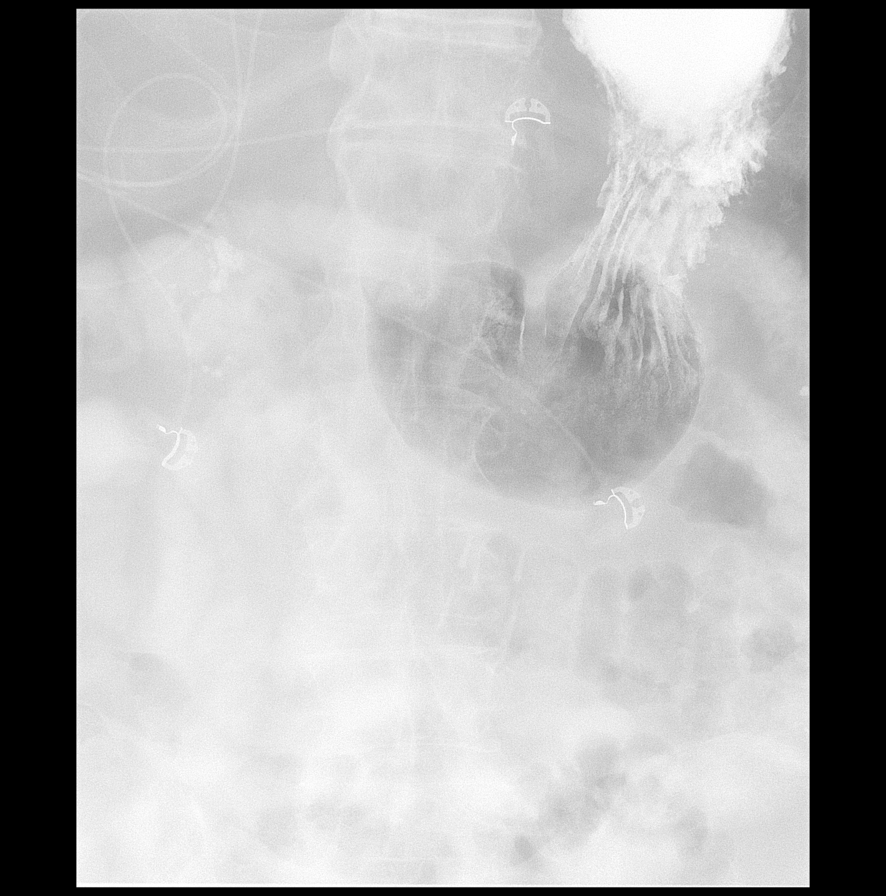

[Series 3: fluoro_barium 2fps_bw · 0.18mm/px · 1 of 1 slices shown (3 of 4)]
[im 1/1]
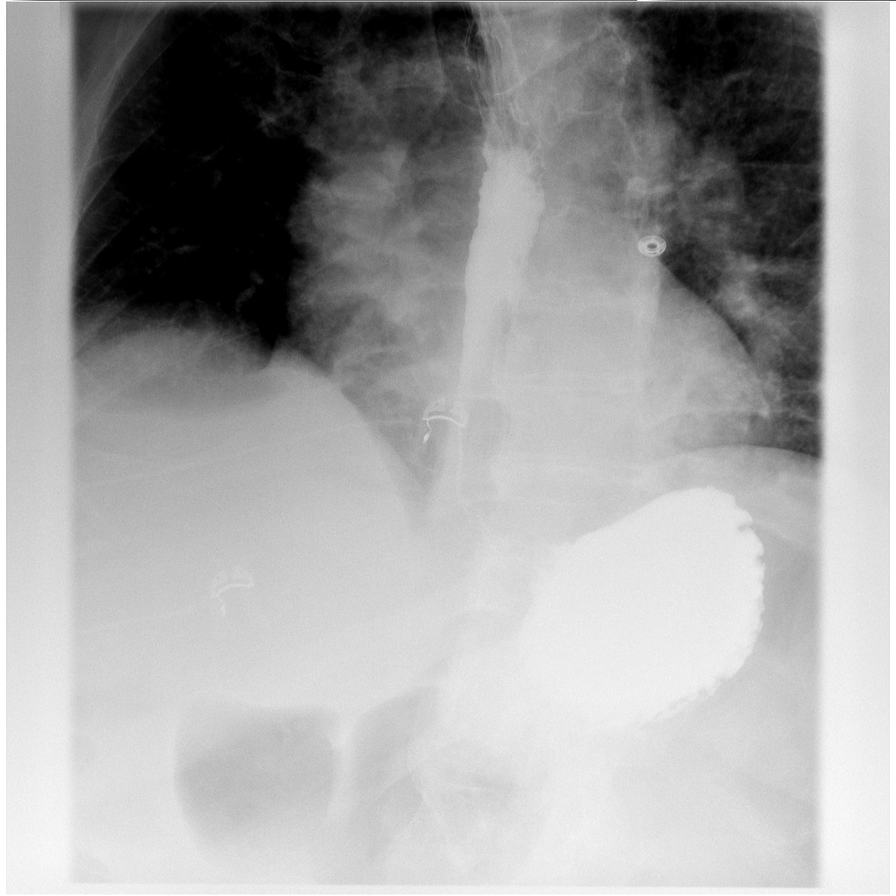

[Series 4: fluoro_barium 2fps_bw · 0.17mm/px · 1 of 1 slices shown (4 of 4)]
[im 1/1]
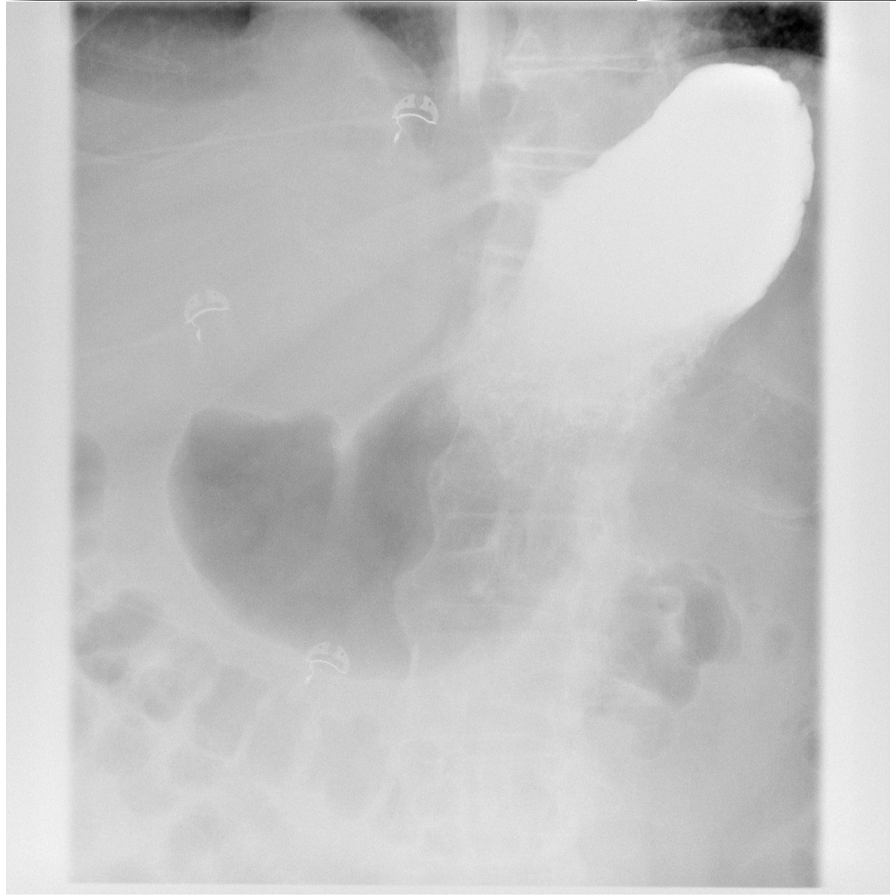

[7 of 7 positions shown; findings below may reference images not displayed]

FINDINGS: Esophagus is widely patent. Delay of contrast noted in the stomach.
Stomach and duodenum are patent. Contrast empties slowly into the
small bowel. Repeat KUB can be obtained in a.m. to demonstrate
continued passage of contrast.
IMPRESSION: Esophagus, stomach, duodenum, proximal small bowel are patent. There
is delayed emptying from the stomach. Follow-up exam can be obtained
to demonstrate continued emptying.
